# Patient Record
Sex: Male | Born: 1960 | Race: White | Hispanic: No | Smoking: Current every day smoker
Health system: Southern US, Community
[De-identification: ages and names within clinical notes are randomized; demographics above are authoritative.]

## PROBLEM LIST (undated history)

## (undated) DIAGNOSIS — M21372 Foot drop, left foot: Secondary | ICD-10-CM

## (undated) DIAGNOSIS — I10 Essential (primary) hypertension: Secondary | ICD-10-CM

## (undated) DIAGNOSIS — Z9289 Personal history of other medical treatment: Secondary | ICD-10-CM

## (undated) DIAGNOSIS — J449 Chronic obstructive pulmonary disease, unspecified: Secondary | ICD-10-CM

## (undated) DIAGNOSIS — F419 Anxiety disorder, unspecified: Secondary | ICD-10-CM

## (undated) DIAGNOSIS — D696 Thrombocytopenia, unspecified: Secondary | ICD-10-CM

## (undated) DIAGNOSIS — M4800 Spinal stenosis, site unspecified: Secondary | ICD-10-CM

## (undated) DIAGNOSIS — M1990 Unspecified osteoarthritis, unspecified site: Secondary | ICD-10-CM

## (undated) DIAGNOSIS — E669 Obesity, unspecified: Secondary | ICD-10-CM

## (undated) DIAGNOSIS — Z9889 Other specified postprocedural states: Secondary | ICD-10-CM

## (undated) HISTORY — PX: BACK SURGERY: SHX140

## (undated) HISTORY — PX: CERVICAL SPINE SURGERY: SHX589

## (undated) HISTORY — DX: Obesity, unspecified: E66.9

## (undated) HISTORY — DX: Other specified postprocedural states: Z98.890

## (undated) HISTORY — DX: Spinal stenosis, site unspecified: M48.00

## (undated) HISTORY — DX: Personal history of other medical treatment: Z92.89

## (undated) HISTORY — DX: Essential (primary) hypertension: I10

## (undated) HISTORY — DX: Unspecified osteoarthritis, unspecified site: M19.90

## (undated) HISTORY — PX: OTHER SURGICAL HISTORY: SHX169

## (undated) HISTORY — PX: LUMBAR DISC SURGERY: SHX700

## (undated) HISTORY — PX: BREAST REDUCTION SURGERY: SHX8

---

## 2001-10-29 ENCOUNTER — Encounter: Admission: RE | Admit: 2001-10-29 | Discharge: 2001-12-19 | Payer: Self-pay | Admitting: Family Medicine

## 2002-06-27 ENCOUNTER — Encounter: Payer: Self-pay | Admitting: Family Medicine

## 2002-06-27 ENCOUNTER — Encounter: Admission: RE | Admit: 2002-06-27 | Discharge: 2002-06-27 | Payer: Self-pay | Admitting: Family Medicine

## 2002-08-28 ENCOUNTER — Encounter: Payer: Self-pay | Admitting: Chiropractic Medicine

## 2002-08-28 ENCOUNTER — Encounter: Admission: RE | Admit: 2002-08-28 | Discharge: 2002-08-28 | Payer: Self-pay | Admitting: Chiropractic Medicine

## 2004-01-04 ENCOUNTER — Encounter: Admission: RE | Admit: 2004-01-04 | Discharge: 2004-01-04 | Payer: Self-pay | Admitting: Psychiatry

## 2004-02-17 ENCOUNTER — Encounter: Admission: RE | Admit: 2004-02-17 | Discharge: 2004-02-17 | Payer: Self-pay | Admitting: Family Medicine

## 2004-04-14 ENCOUNTER — Ambulatory Visit: Payer: Self-pay | Admitting: Psychiatry

## 2004-05-11 ENCOUNTER — Ambulatory Visit: Payer: Self-pay | Admitting: Psychiatry

## 2004-06-02 ENCOUNTER — Ambulatory Visit: Payer: Self-pay | Admitting: Psychiatry

## 2005-05-05 ENCOUNTER — Ambulatory Visit (HOSPITAL_COMMUNITY): Admission: RE | Admit: 2005-05-05 | Discharge: 2005-05-05 | Payer: Self-pay | Admitting: Family Medicine

## 2005-10-06 ENCOUNTER — Ambulatory Visit (HOSPITAL_COMMUNITY): Admission: RE | Admit: 2005-10-06 | Discharge: 2005-10-06 | Payer: Self-pay | Admitting: Family Medicine

## 2007-09-11 ENCOUNTER — Ambulatory Visit (HOSPITAL_COMMUNITY): Admission: RE | Admit: 2007-09-11 | Discharge: 2007-09-11 | Payer: Self-pay | Admitting: Family Medicine

## 2007-09-26 ENCOUNTER — Ambulatory Visit (HOSPITAL_COMMUNITY): Admission: RE | Admit: 2007-09-26 | Discharge: 2007-09-26 | Payer: Self-pay | Admitting: Family Medicine

## 2009-07-24 HISTORY — PX: CARDIAC CATHETERIZATION: SHX172

## 2010-02-15 ENCOUNTER — Ambulatory Visit (HOSPITAL_COMMUNITY): Admission: RE | Admit: 2010-02-15 | Discharge: 2010-02-15 | Payer: Self-pay | Admitting: Family Medicine

## 2010-03-11 ENCOUNTER — Ambulatory Visit (HOSPITAL_COMMUNITY): Admission: RE | Admit: 2010-03-11 | Discharge: 2010-03-11 | Payer: Self-pay | Admitting: Cardiovascular Disease

## 2010-03-25 ENCOUNTER — Ambulatory Visit (HOSPITAL_COMMUNITY): Admission: RE | Admit: 2010-03-25 | Discharge: 2010-03-25 | Payer: Self-pay | Admitting: Cardiology

## 2010-07-24 DIAGNOSIS — Z9289 Personal history of other medical treatment: Secondary | ICD-10-CM

## 2010-07-24 HISTORY — DX: Personal history of other medical treatment: Z92.89

## 2011-07-25 DIAGNOSIS — Z9289 Personal history of other medical treatment: Secondary | ICD-10-CM

## 2011-07-25 HISTORY — DX: Personal history of other medical treatment: Z92.89

## 2012-06-23 HISTORY — PX: LAPAROSCOPIC GASTRIC SLEEVE RESECTION: SHX5895

## 2012-09-20 ENCOUNTER — Telehealth: Payer: Self-pay

## 2012-09-20 NOTE — Telephone Encounter (Signed)
OV on 10/10/2012 at 11:00 AM with Lorenza Burton, NP due to meds, prior to scheduling colonoscopy.

## 2012-10-10 ENCOUNTER — Encounter (HOSPITAL_COMMUNITY): Payer: Self-pay | Admitting: Pharmacy Technician

## 2012-10-10 ENCOUNTER — Ambulatory Visit (INDEPENDENT_AMBULATORY_CARE_PROVIDER_SITE_OTHER): Payer: Self-pay | Admitting: Urgent Care

## 2012-10-10 ENCOUNTER — Encounter: Payer: Self-pay | Admitting: Urgent Care

## 2012-10-10 VITALS — BP 136/90 | HR 71 | Temp 98.5°F | Ht 72.0 in | Wt 309.8 lb

## 2012-10-10 DIAGNOSIS — K649 Unspecified hemorrhoids: Secondary | ICD-10-CM | POA: Insufficient documentation

## 2012-10-10 DIAGNOSIS — Z1211 Encounter for screening for malignant neoplasm of colon: Secondary | ICD-10-CM | POA: Insufficient documentation

## 2012-10-10 MED ORDER — PEG 3350-KCL-NA BICARB-NACL 420 G PO SOLR: 4000.0000 mL | ORAL | Status: DC

## 2012-10-10 MED ORDER — HYDROCORTISONE ACETATE 25 MG RE SUPP: 25.0000 mg | Freq: Two times a day (BID) | RECTAL | Status: DC

## 2012-10-10 NOTE — Assessment & Plan Note (Signed)
RORAN WEGNER is a pleasant 52 y.o. male due for screening colonoscopy with Dr Jena Gauss. I have discussed risks & benefits which include, but are not limited to, bleeding, infection, perforation & drug reaction.  The patient agrees with this plan & written consent will be obtained.   Phenergan 25mg  IV 30 minutes prior to procedure to augment sedation given hx of chronic narcotics, anxiolytics & alcohol

## 2012-10-10 NOTE — Patient Instructions (Addendum)
Colonoscopy with Dr Lavada Mesi [facility] supp twice daily for possible hemorrhoids.  Will revaluate further with colonoscopy. Hemorrhoids Hemorrhoids are enlarged (dilated) veins around the rectum. There are 2 types of hemorrhoids, and the type of hemorrhoid is determined by its location. Internal hemorrhoids occur in the veins just inside the rectum.They are usually not painful, but they may bleed.However, they may poke through to the outside and become irritated and painful. External hemorrhoids involve the veins outside the anus and can be felt as a painful swelling or hard lump near the anus.They are often itchy and may crack and bleed. Sometimes clots will form in the veins. This makes them swollen and painful. These are called thrombosed hemorrhoids. CAUSES Causes of hemorrhoids include:  Pregnancy. This increases the pressure in the hemorrhoidal veins.  Constipation.  Straining to have a bowel movement.  Obesity.  Heavy lifting or other activity that caused you to strain. TREATMENT Most of the time hemorrhoids improve in 1 to 2 weeks. However, if symptoms do not seem to be getting better or if you have a lot of rectal bleeding, your caregiver may perform a procedure to help make the hemorrhoids get smaller or remove them completely.Possible treatments include:  Rubber band ligation. A rubber band is placed at the base of the hemorrhoid to cut off the circulation.  Sclerotherapy. A chemical is injected to shrink the hemorrhoid.  Infrared light therapy. Tools are used to burn the hemorrhoid.  Hemorrhoidectomy. This is surgical removal of the hemorrhoid. HOME CARE INSTRUCTIONS   Increase fiber in your diet. Ask your caregiver about using fiber supplements.  Drink enough water and fluids to keep your urine clear or pale yellow.  Exercise regularly.  Go to the bathroom when you have the urge to have a bowel movement. Do not wait.  Avoid straining to have bowel movements.  Keep  the anal area dry and clean.  Only take over-the-counter or prescription medicines for pain, discomfort, or fever as directed by your caregiver. If your hemorrhoids are thrombosed:  Take warm sitz baths for 20 to 30 minutes, 3 to 4 times per day.  If the hemorrhoids are very tender and swollen, place ice packs on the area as tolerated. Using ice packs between sitz baths may be helpful. Fill a plastic bag with ice. Place a towel between the bag of ice and your skin.  Medicated creams and suppositories may be used or applied as directed.  Do not use a donut-shaped pillow or sit on the toilet for long periods. This increases blood pooling and pain. SEEK MEDICAL CARE IF:   You have increasing pain and swelling that is not controlled with your medicine.  You have uncontrolled bleeding.  You have difficulty or you are unable to have a bowel movement.  You have pain or inflammation outside the area of the hemorrhoids.  You have chills or an oral temperature above 102 F (38.9 C). MAKE SURE YOU:   Understand these instructions.  Will watch your condition.  Will get help right away if you are not doing well or get worse. Document Released: 07/07/2000 Document Revised: 10/02/2011 Document Reviewed: 06/20/2010 [facility] Patient Information 2013 [place], [place].

## 2012-10-10 NOTE — Assessment & Plan Note (Signed)
Anusol HC supp 1 PR BID Further evaluation with colonoscopy.

## 2012-10-10 NOTE — Progress Notes (Signed)
Referring Provider: McGough Primary Care Physician:  MCGOUGH,WILLIAM M, MD Primary Gastroenterologist:  Dr. Rourk  Chief Complaint  Patient presents with  . Colonoscopy    HPI:  Jerome [name] is a 52 y.o. male here as a referral from Dr. McGough to schedule screening colonoscopy.  He has been having problems with hemorrhoids protruding out.   Also, problems with hygeine.  He is on a chronic narcotic, anxiolytic & antidepressant.   Denies constipation, diarrhea, rectal bleeding, melena.  Recent gastric sleeve at [facility], losing weight.   Denies heartburn, indigestion, nausea, vomiting, dysphagia, odynophagia or anorexia. Past Medical History  Diagnosis Date  . DJD (degenerative joint disease)   . Spinal stenosis   . Hypertension   . Obesity     Past Surgical History  Procedure Laterality Date  . Laparoscopic gastric sleeve resection      Dr [place] ([facility])  . Breast reduction surgery    . Back surgery      x 4    Current Outpatient Prescriptions  Medication Sig Dispense Refill  . atorvastatin (LIPITOR) 20 MG tablet Take 20 mg by mouth daily.       . clonazePAM (KLONOPIN) 1 MG tablet Take 1 mg by mouth at bedtime as needed for anxiety (Sleep).       . Flaxseed, Linseed, (FLAX SEED OIL PO) Take 1 tablet by mouth daily.       . gabapentin (NEURONTIN) 100 MG capsule Take 200 mg by mouth at bedtime.       . HYDROcodone-acetaminophen (NORCO/VICODIN) 5-325 MG per tablet Take 1 tablet by mouth every 6 (six) hours as needed for pain.       . losartan (COZAAR) 50 MG tablet Take 50 mg by mouth daily.       . Multiple Vitamin (MULTIVITAMIN) capsule Take 2 capsules by mouth daily.      . omeprazole (PRILOSEC) 20 MG capsule Take 20 mg by mouth daily.       . sertraline (ZOLOFT) 100 MG tablet Take 100 mg by mouth daily.       . vitamin B-12 (CYANOCOBALAMIN) 1000 MCG tablet Take 1,000 mcg by mouth daily.      . hydrocortisone (ANUSOL-HC) 25 MG suppository Place 1 suppository (25 mg total)  rectally every 12 (twelve) hours.  20 suppository  0  . polyethylene glycol-electrolytes (TRILYTE) 420 G solution Take 4,000 mLs by mouth as directed.  4000 mL  0   No current facility-administered medications for this visit.    Allergies as of 10/10/2012  . (No Known Allergies)    Family History  Problem Relation Age of Onset  . Colon cancer Paternal Grandmother   . Brain cancer Brother 33    History   Social History  . Marital Status: Divorced    Spouse Name: N/A    Number of Children: 2  . Years of Education: N/A   Occupational History  . 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 Smoking status: Former Smoker -- 0.50 packs/day    Types: Cigarettes    Quit date: 06/12/2012  . Smokeless tobacco: Not on file  . Alcohol Use: No  . Drug Use: No  . Sexually Active: Not on file   Other Topics Concern  . Not on file   Social History Narrative   Moving to [place] to care for brother w/ brain tumor   Lives w/ 2 daughters & 4 grandsons    Review of   Systems: Gen: Denies any fever, chills, sweats, anorexia, fatigue, weakness, malaise CV: Denies chest pain, angina, palpitations, syncope, orthopnea, PND, peripheral edema, and claudication. Resp: Denies dyspnea at rest, dyspnea with exercise, cough, sputum, wheezing, coughing up blood, and pleurisy. GI: Denies vomiting blood, jaundice, and fecal incontinence.    GU : Denies urinary burning, blood in urine, urinary frequency, urinary hesitancy, nocturnal urination, and urinary incontinence. MS: Denies joint pain, limitation of movement, and swelling, stiffness, low back pain, extremity pain. Denies muscle weakness, cramps, atrophy.  Derm: Denies rash, itching, dry skin, hives, moles, warts, or unhealing ulcers.  Psych: Denies depression, anxiety, memory loss, suicidal ideation, hallucinations, paranoia, and confusion. Heme: Denies bruising, bleeding, and enlarged lymph nodes. Neuro:  Denies any  headaches, dizziness, paresthesias. Endo:  Denies any problems with DM, thyroid, adrenal function.  Physical Exam: BP 136/90  Pulse 71  Temp(Src) 98.5 F (36.9 C) (Oral)  Ht 6' (1.829 m)  Wt 309 lb 12.8 oz (140.524 kg)  BMI 42.01 kg/m2 No LMP for male patient. General:   Alert,  Well-developed, obese, pleasant and cooperative in NAD Head:  Normocephalic and atraumatic. Eyes:  Sclera clear, no icterus.   Conjunctiva pink. Ears:  Normal auditory acuity. Nose:  No deformity, discharge, or lesions. Mouth:  No deformity or lesions,oropharynx pink & moist. Neck:  Supple; no masses or thyromegaly. Lungs:  Clear throughout to auscultation.   No wheezes, crackles, or rhonchi. No acute distress. Heart:  Regular rate and rhythm; no murmurs, clicks, rubs,  or gallops. Abdomen:  Protuberant.  Normal bowel sounds.  No bruits.  Soft, non-tender and non-distended without masses, hepatosplenomegaly or hernias noted.  No guarding or rebound tenderness.  Exam limited given body habitus. Rectal:  Deferred. Msk:  Symmetrical without gross deformities. Normal posture. Pulses:  Normal pulses noted. Extremities:  No clubbing or edema. Neurologic:  Alert and oriented x4;  grossly normal neurologically. Skin:  Intact without significant lesions or rashes. Lymph Nodes:  No significant cervical adenopathy. Psych:  Alert and cooperative. Normal mood and affect.   

## 2012-10-11 NOTE — Progress Notes (Signed)
Faxed to PCP

## 2012-10-14 ENCOUNTER — Encounter (HOSPITAL_COMMUNITY): Payer: Self-pay | Admitting: *Deleted

## 2012-10-14 ENCOUNTER — Encounter (HOSPITAL_COMMUNITY)
Admission: RE | Disposition: A | Discharge status: Home / Self Care | Payer: Self-pay | Source: Ambulatory Visit | Attending: Internal Medicine

## 2012-10-14 ENCOUNTER — Ambulatory Visit (HOSPITAL_COMMUNITY)
Admission: RE | Admit: 2012-10-14 | Discharge: 2012-10-14 | Disposition: A | Discharge status: Home / Self Care | Payer: PRIVATE HEALTH INSURANCE | Source: Ambulatory Visit | Attending: Internal Medicine | Admitting: Internal Medicine

## 2012-10-14 DIAGNOSIS — M4800 Spinal stenosis, site unspecified: Secondary | ICD-10-CM | POA: Insufficient documentation

## 2012-10-14 DIAGNOSIS — Z800 Family history of malignant neoplasm of digestive organs: Secondary | ICD-10-CM | POA: Insufficient documentation

## 2012-10-14 DIAGNOSIS — Z6841 Body Mass Index (BMI) 40.0 and over, adult: Secondary | ICD-10-CM | POA: Insufficient documentation

## 2012-10-14 DIAGNOSIS — Z1211 Encounter for screening for malignant neoplasm of colon: Secondary | ICD-10-CM

## 2012-10-14 DIAGNOSIS — Z79899 Other long term (current) drug therapy: Secondary | ICD-10-CM | POA: Insufficient documentation

## 2012-10-14 DIAGNOSIS — M1990 Unspecified osteoarthritis, unspecified site: Secondary | ICD-10-CM | POA: Insufficient documentation

## 2012-10-14 DIAGNOSIS — Z87891 Personal history of nicotine dependence: Secondary | ICD-10-CM | POA: Insufficient documentation

## 2012-10-14 DIAGNOSIS — I10 Essential (primary) hypertension: Secondary | ICD-10-CM | POA: Insufficient documentation

## 2012-10-14 DIAGNOSIS — Z9884 Bariatric surgery status: Secondary | ICD-10-CM | POA: Insufficient documentation

## 2012-10-14 HISTORY — PX: COLONOSCOPY: SHX5424

## 2012-10-14 SURGERY — COLONOSCOPY
Anesthesia: Moderate Sedation

## 2012-10-14 MED ORDER — MIDAZOLAM HCL 5 MG/5ML IJ SOLN
INTRAMUSCULAR | Status: AC
  Filled 2012-10-14: qty 10

## 2012-10-14 MED ORDER — PROMETHAZINE HCL 25 MG/ML IJ SOLN
INTRAMUSCULAR | Status: AC
  Filled 2012-10-14: qty 1

## 2012-10-14 MED ORDER — MIDAZOLAM HCL 5 MG/5ML IJ SOLN
INTRAMUSCULAR | Status: DC | PRN
  Administered 2012-10-14: 1 mg via INTRAVENOUS
  Administered 2012-10-14 (×4): 2 mg via INTRAVENOUS

## 2012-10-14 MED ORDER — MEPERIDINE HCL 100 MG/ML IJ SOLN
INTRAMUSCULAR | Status: DC | PRN
  Administered 2012-10-14: 25 mg via INTRAVENOUS
  Administered 2012-10-14 (×2): 50 mg via INTRAVENOUS
  Administered 2012-10-14: 25 mg via INTRAVENOUS

## 2012-10-14 MED ORDER — MEPERIDINE HCL 100 MG/ML IJ SOLN
INTRAMUSCULAR | Status: AC
  Filled 2012-10-14: qty 1

## 2012-10-14 MED ORDER — STERILE WATER FOR IRRIGATION IR SOLN
Status: DC | PRN
  Administered 2012-10-14: 13:00:00

## 2012-10-14 MED ORDER — ONDANSETRON HCL 4 MG/2ML IJ SOLN
INTRAMUSCULAR | Status: AC
  Filled 2012-10-14: qty 2

## 2012-10-14 MED ORDER — SODIUM CHLORIDE 0.9 % IJ SOLN
INTRAMUSCULAR | Status: AC
Start: 2012-10-14 — End: 2012-10-14
  Filled 2012-10-14: qty 10

## 2012-10-14 MED ORDER — PROMETHAZINE HCL 25 MG/ML IJ SOLN
25.0000 mg | Freq: Once | INTRAMUSCULAR | Status: AC
  Administered 2012-10-14: 25 mg via INTRAVENOUS

## 2012-10-14 MED ORDER — ONDANSETRON HCL 4 MG/2ML IJ SOLN
INTRAMUSCULAR | Status: DC | PRN
  Administered 2012-10-14: 4 mg via INTRAVENOUS

## 2012-10-14 MED ORDER — SODIUM CHLORIDE 0.9 % IV SOLN
INTRAVENOUS | Status: DC
  Administered 2012-10-14: 13:00:00 via INTRAVENOUS

## 2012-10-14 NOTE — Op Note (Signed)
[facility] [address] [place], [address]   COLONOSCOPY PROCEDURE REPORT  PATIENT: [name], Jerome  MR#:         [id number] BIRTHDATE: [birth year] , 51  yrs. old GENDER: Male ENDOSCOPIST: R.  Roetta Sessions, MD FACP FACG REFERRED BY:  Karleen Hampshire, M.D. PROCEDURE DATE:  10/14/2012 PROCEDURE:     Screening colonoscopy  INDICATIONS:  Average risk colorectal cancer screening -first examination  INFORMED CONSENT:  The risks, benefits, alternatives and imponderables including but not limited to bleeding, perforation as well as the possibility of a missed lesion have been reviewed.  The potential for biopsy, lesion removal, etc. have also been discussed.  Questions have been answered.  All parties agreeable. Please see the history and physical in the medical record for more information.  MEDICATIONS: Versed 9 mg IV and Demerol 150 mg IV in divided doses. Cetacaine spray.  DESCRIPTION OF PROCEDURE:  After a digital rectal exam was performed, the EC-[id number] ([id number])  colonoscope was advanced from the anus through the rectum and colon to the area of the cecum, ileocecal valve and appendiceal orifice.  The cecum was deeply intubated.  These structures were well-seen and photographed for the record.  From the level of the cecum and ileocecal valve, the scope was slowly and cautiously withdrawn.  The mucosal surfaces were carefully surveyed utilizing scope tip deflection to facilitate fold flattening as needed.  The scope was pulled down into the rectum where a thorough examination including retroflexion was performed.    FINDINGS:  Adequate preparation. Normal rectum Normal-appearing colonic mucosa.  THERAPEUTIC / DIAGNOSTIC MANEUVERS PERFORMED:  None  COMPLICATIONS: None  CECAL WITHDRAWAL TIME:  8 minutes  IMPRESSION:  Normal rectum and colon  RECOMMENDATIONS: Repeat colonoscopy in 10 years for  screening purposes.   _______________________________ eSigned:  R. Roetta Sessions, MD FACP [facility] 10/14/2012 2:17 PM   CC:

## 2012-10-14 NOTE — H&P (View-Only) (Signed)
Referring Provider: McGough Primary Care Physician:  Kirk Ruths, MD Primary Gastroenterologist:  Dr. Jena Gauss  Chief Complaint  Patient presents with  . Colonoscopy    HPI:  Jerome [name] is a 52 y.o. male here as a referral from Dr. Regino Schultze to schedule screening colonoscopy.  He has been having problems with hemorrhoids protruding out.   Also, problems with hygeine.  He is on a chronic narcotic, anxiolytic & antidepressant.   Denies constipation, diarrhea, rectal bleeding, melena.  Recent gastric sleeve at [facility], losing weight.   Denies heartburn, indigestion, nausea, vomiting, dysphagia, odynophagia or anorexia. Past Medical History  Diagnosis Date  . DJD (degenerative joint disease)   . Spinal stenosis   . Hypertension   . Obesity     Past Surgical History  Procedure Laterality Date  . Laparoscopic gastric sleeve resection      Dr Roe Rutherford [facility])  . Breast reduction surgery    . Back surgery      x 4    Current Outpatient Prescriptions  Medication Sig Dispense Refill  . atorvastatin (LIPITOR) 20 MG tablet Take 20 mg by mouth daily.       . clonazePAM (KLONOPIN) 1 MG tablet Take 1 mg by mouth at bedtime as needed for anxiety (Sleep).       . Flaxseed, Linseed, (FLAX SEED OIL PO) Take 1 tablet by mouth daily.       Marland Kitchen gabapentin (NEURONTIN) 100 MG capsule Take 200 mg by mouth at bedtime.       Marland Kitchen HYDROcodone-acetaminophen (NORCO/VICODIN) 5-325 MG per tablet Take 1 tablet by mouth every 6 (six) hours as needed for pain.       Marland Kitchen losartan (COZAAR) 50 MG tablet Take 50 mg by mouth daily.       . Multiple Vitamin (MULTIVITAMIN) capsule Take 2 capsules by mouth daily.      Marland Kitchen omeprazole (PRILOSEC) 20 MG capsule Take 20 mg by mouth daily.       . sertraline (ZOLOFT) 100 MG tablet Take 100 mg by mouth daily.       . vitamin B-12 (CYANOCOBALAMIN) 1000 MCG tablet Take 1,000 mcg by mouth daily.      . hydrocortisone (ANUSOL-HC) 25 MG suppository Place 1 suppository (25 mg total)  rectally every 12 (twelve) hours.  20 suppository  0  . polyethylene glycol-electrolytes (TRILYTE) 420 G solution Take 4,000 mLs by mouth as directed.  4000 mL  0   No current facility-administered medications for this visit.    Allergies as of 10/10/2012  . (No Known Allergies)    Family History  Problem Relation Age of Onset  . Colon cancer Paternal Grandmother   . Brain cancer Brother 22    History   Social History  . Marital Status: Divorced    Spouse Name: N/A    Number of Children: 2  . Years of Education: N/A   Occupational History  . 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 Smoking status: Former Smoker -- 0.50 packs/day    Types: Cigarettes    Quit date: 06/12/2012  . Smokeless tobacco: Not on file  . Alcohol Use: No  . Drug Use: No  . Sexually Active: Not on file   Other Topics Concern  . Not on file   Social History Narrative   Moving to [place] to care for brother w/ brain tumor   Lives w/ 2 daughters & 4 grandsons    Review of  Systems: Gen: Denies any fever, chills, sweats, anorexia, fatigue, weakness, malaise CV: Denies chest pain, angina, palpitations, syncope, orthopnea, PND, peripheral edema, and claudication. Resp: Denies dyspnea at rest, dyspnea with exercise, cough, sputum, wheezing, coughing up blood, and pleurisy. GI: Denies vomiting blood, jaundice, and fecal incontinence.    GU : Denies urinary burning, blood in urine, urinary frequency, urinary hesitancy, nocturnal urination, and urinary incontinence. MS: Denies joint pain, limitation of movement, and swelling, stiffness, low back pain, extremity pain. Denies muscle weakness, cramps, atrophy.  Derm: Denies rash, itching, dry skin, hives, moles, warts, or unhealing ulcers.  Psych: Denies depression, anxiety, memory loss, suicidal ideation, hallucinations, paranoia, and confusion. Heme: Denies bruising, bleeding, and enlarged lymph nodes. Neuro:  Denies any  headaches, dizziness, paresthesias. Endo:  Denies any problems with DM, thyroid, adrenal function.  Physical Exam: BP 136/90  Pulse 71  Temp(Src) 98.5 F (36.9 C) (Oral)  Ht 6' (1.829 m)  Wt 309 lb 12.8 oz (140.524 kg)  BMI 42.01 kg/m2 No LMP for male patient. General:   Alert,  Well-developed, obese, pleasant and cooperative in NAD Head:  Normocephalic and atraumatic. Eyes:  Sclera clear, no icterus.   Conjunctiva pink. Ears:  Normal auditory acuity. Nose:  No deformity, discharge, or lesions. Mouth:  No deformity or lesions,oropharynx pink & moist. Neck:  Supple; no masses or thyromegaly. Lungs:  Clear throughout to auscultation.   No wheezes, crackles, or rhonchi. No acute distress. Heart:  Regular rate and rhythm; no murmurs, clicks, rubs,  or gallops. Abdomen:  Protuberant.  Normal bowel sounds.  No bruits.  Soft, non-tender and non-distended without masses, hepatosplenomegaly or hernias noted.  No guarding or rebound tenderness.  Exam limited given body habitus. Rectal:  Deferred. Msk:  Symmetrical without gross deformities. Normal posture. Pulses:  Normal pulses noted. Extremities:  No clubbing or edema. Neurologic:  Alert and oriented x4;  grossly normal neurologically. Skin:  Intact without significant lesions or rashes. Lymph Nodes:  No significant cervical adenopathy. Psych:  Alert and cooperative. Normal mood and affect.

## 2012-10-14 NOTE — Interval H&P Note (Signed)
History and Physical Interval Note:  10/14/2012 1:29 PM  Jerome [name]  has presented today for surgery, with the diagnosis of SCREENING TCS  The various methods of treatment have been discussed with the patient and family. After consideration of risks, benefits and other options for treatment, the patient has consented to  Procedure(s) with comments: COLONOSCOPY (N/A) - 1:45 as a surgical intervention .  The patient's history has been reviewed, patient examined, no change in status, stable for surgery.  I have reviewed the patient's chart and labs.  Questions were answered to the patient's satisfaction.    Colonoscopy per plan.The risks, benefits, limitations, alternatives and imponderables have been reviewed with the patient. Questions have been answered. All parties are agreeable.  Eula Listen

## 2012-10-17 ENCOUNTER — Encounter (HOSPITAL_COMMUNITY): Payer: Self-pay | Admitting: Internal Medicine

## 2012-12-09 ENCOUNTER — Other Ambulatory Visit: Payer: Self-pay | Admitting: Pharmacist

## 2012-12-09 MED ORDER — ATORVASTATIN CALCIUM 20 MG PO TABS: 20.0000 mg | ORAL_TABLET | Freq: Every day | ORAL | Status: DC

## 2013-01-06 DIAGNOSIS — E785 Hyperlipidemia, unspecified: Secondary | ICD-10-CM | POA: Insufficient documentation

## 2013-01-06 DIAGNOSIS — K219 Gastro-esophageal reflux disease without esophagitis: Secondary | ICD-10-CM | POA: Insufficient documentation

## 2013-04-22 ENCOUNTER — Other Ambulatory Visit (HOSPITAL_COMMUNITY): Payer: Self-pay | Admitting: Family Medicine

## 2013-04-22 ENCOUNTER — Ambulatory Visit (HOSPITAL_COMMUNITY)
Admission: RE | Admit: 2013-04-22 | Discharge: 2013-04-22 | Disposition: A | Discharge status: Home / Self Care | Payer: BC Managed Care – PPO | Source: Ambulatory Visit | Attending: Family Medicine | Admitting: Family Medicine

## 2013-04-22 DIAGNOSIS — E785 Hyperlipidemia, unspecified: Secondary | ICD-10-CM

## 2013-04-22 DIAGNOSIS — I10 Essential (primary) hypertension: Secondary | ICD-10-CM | POA: Insufficient documentation

## 2013-04-22 DIAGNOSIS — Z0000 Encounter for general adult medical examination without abnormal findings: Secondary | ICD-10-CM

## 2013-07-27 ENCOUNTER — Emergency Department (HOSPITAL_COMMUNITY)
Admission: EM | Admit: 2013-07-27 | Discharge: 2013-07-27 | Disposition: A | Discharge status: Home / Self Care | Payer: BC Managed Care – PPO | Attending: Emergency Medicine | Admitting: Emergency Medicine

## 2013-07-27 ENCOUNTER — Encounter (HOSPITAL_COMMUNITY): Payer: Self-pay | Admitting: Emergency Medicine

## 2013-07-27 DIAGNOSIS — M1990 Unspecified osteoarthritis, unspecified site: Secondary | ICD-10-CM | POA: Insufficient documentation

## 2013-07-27 DIAGNOSIS — Z206 Contact with and (suspected) exposure to human immunodeficiency virus [HIV]: Secondary | ICD-10-CM

## 2013-07-27 DIAGNOSIS — Z862 Personal history of diseases of the blood and blood-forming organs and certain disorders involving the immune mechanism: Secondary | ICD-10-CM | POA: Insufficient documentation

## 2013-07-27 DIAGNOSIS — I10 Essential (primary) hypertension: Secondary | ICD-10-CM | POA: Insufficient documentation

## 2013-07-27 DIAGNOSIS — Z87891 Personal history of nicotine dependence: Secondary | ICD-10-CM | POA: Insufficient documentation

## 2013-07-27 DIAGNOSIS — Z202 Contact with and (suspected) exposure to infections with a predominantly sexual mode of transmission: Secondary | ICD-10-CM | POA: Insufficient documentation

## 2013-07-27 DIAGNOSIS — E669 Obesity, unspecified: Secondary | ICD-10-CM | POA: Insufficient documentation

## 2013-07-27 HISTORY — DX: Thrombocytopenia, unspecified: D69.6

## 2013-07-27 LAB — COMPREHENSIVE METABOLIC PANEL
ALK PHOS: 87 U/L (ref 39–117)
ALT: 21 U/L (ref 0–53)
AST: 20 U/L (ref 0–37)
Albumin: 4.4 g/dL (ref 3.5–5.2)
BILIRUBIN TOTAL: 0.8 mg/dL (ref 0.3–1.2)
BUN: 15 mg/dL (ref 6–23)
CHLORIDE: 103 meq/L (ref 96–112)
CO2: 25 meq/L (ref 19–32)
CREATININE: 0.64 mg/dL (ref 0.50–1.35)
Calcium: 9.8 mg/dL (ref 8.4–10.5)
GFR calc Af Amer: >90 mL/min (ref >90)
Glucose, Bld: 94 mg/dL (ref 70–99)
Potassium: 4.7 mEq/L (ref 3.7–5.3)
Sodium: 140 mEq/L (ref 137–147)
Total Protein: 7.5 g/dL (ref 6.0–8.3)

## 2013-07-27 LAB — CBC
HEMATOCRIT: 49.6 % (ref 39.0–52.0)
Hemoglobin: 17.8 g/dL — ABNORMAL HIGH (ref 13.0–17.0)
MCH: 31.1 pg (ref 26.0–34.0)
MCHC: 35.9 g/dL (ref 30.0–36.0)
MCV: 86.7 fL (ref 78.0–100.0)
PLATELETS: 105 10*3/uL — AB (ref 150–400)
RBC: 5.72 MIL/uL (ref 4.22–5.81)
RDW: 14 % (ref 11.5–15.5)
WBC: 6.9 10*3/uL (ref 4.0–10.5)

## 2013-07-27 LAB — HEPATITIS PANEL, ACUTE
HCV Ab: NEGATIVE
HEP B S AG: NEGATIVE
Hep A IgM: NONREACTIVE
Hep B C IgM: NONREACTIVE

## 2013-07-27 LAB — RAPID HIV SCREEN (WH-MAU): Rapid HIV Screen: NONREACTIVE

## 2013-07-27 MED ORDER — EMTRICITABINE-TENOFOVIR DF 200-300 MG PO TABS: 1.0000 | ORAL_TABLET | Freq: Every day | ORAL | Status: DC

## 2013-07-27 MED ORDER — EMTRICITABINE-TENOFOVIR DF 200-300 MG PO TABS
3.0000 | ORAL_TABLET | Freq: Every day | ORAL | Status: DC
  Administered 2013-07-27: 3 via ORAL
  Filled 2013-07-27 (×2): qty 3

## 2013-07-27 MED ORDER — LOPINAVIR-RITONAVIR 200-50 MG PO TABS: 2.0000 | ORAL_TABLET | Freq: Two times a day (BID) | ORAL | Status: DC

## 2013-07-27 MED ORDER — RALTEGRAVIR POTASSIUM 400 MG PO TABS
400.0000 mg | ORAL_TABLET | Freq: Two times a day (BID) | ORAL | Status: DC
  Administered 2013-07-27: 400 mg via ORAL
  Filled 2013-07-27 (×3): qty 1

## 2013-07-27 MED ORDER — RALTEGRAVIR POTASSIUM 400 MG PO TABS
400.0000 mg | ORAL_TABLET | Freq: Two times a day (BID) | ORAL | Status: DC
Start: 2013-07-27 — End: 2013-07-29

## 2013-07-27 NOTE — Discharge Instructions (Signed)
HIV Antibody Test HIV is a virus which destroys our body's ability to fight illness. It does this by causing defects in our immune system. This is the system that protects our body against infections. This virus is the cause of an illness called AIDS. HIV antibodies are made by the infected person's body when a person becomes infected with HIV. WHAT IS THE HIV ANTIBODY TEST? This is a test for HIV antibodies that are found in the blood of an infected person. This test is not a test for AIDS. It only means that you have been infected with HIV and may eventually develop AIDS. WHO IS AT RISK OF BEING INFECTED WITH HIV?  People who have unsafe sex (unsafe sex means having sex without a condom (or other protective barrier) with a person who has the virus.  People who share IV needles or syringes with a person who has the virus.  Anyone who got blood, blood products, or organ transplants before 1985.  Babies born to mothers who have HIV.  Coming in contact with blood or other body fluids of someone infected with HIV. WHAT DOES A NEGATIVE HIV TEST RESULT MEAN? HIV antibodies were not found in your blood. Most of the time it takes our bodies between 6 weeks and 6 months to develop antibodies to HIV. It may take up to one year to develop. During this time, infected people can have a negative result even if they have the virus and will therefore not know if they are putting other people at risk. They should take all necessary precautions to protect others from becoming infected. WHAT DOES A POSITIVE HIV TEST RESULT MEAN?  A positive HIV test means a person has been infected with the HIV virus. This does NOT mean that a person has AIDS, but they may eventually develop it.  A person can give HIV infection to other people through unsafe sex. Sharing IV needles or syringes can also spread HIV.  A woman who has HIV can give the virus to her baby during pregnancy or at birth or possibly from breastfeeding.  Get counseling prior to considering pregnancy. One third to one half of women with HIV infection will pass this infection on to their baby.  Anyone with a positive test for HIV should not donate blood, plasma, blood products, organs or tissues. WHERE CAN I GO TO BE TESTED?  Most county health departments offer HIV Antibody counseling and testing.  Many doctors and other caregivers offer HIV Antibody counseling and testing. If you received a blood test from your caregiver, call for your results as instructed. Remember it is your responsibility to get the results of your test. Do not assume everything is fine if you do not hear from your caregiver. If you get a positive test result, talk to your caregiver to find out what steps to take to assure you receive the best of care. Numerous medications are now available which improve the course of this infection. Document Released: 07/07/2000 Document Revised: 10/02/2011 Document Reviewed: 07/10/2005 [facility] Patient Information 2014 [place].

## 2013-07-27 NOTE — ED Provider Notes (Signed)
CSN: [id number]     Arrival date & time 07/27/13  1016 History   First MD Initiated Contact with Patient 07/27/13 1114     Chief Complaint  Patient presents with  . 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   Pt reports partner has HIv with low viral load.   Pt reports he was anal receptor and condom came off.   Pt wants to start post exposure drugs.   Pt has a history of back problems.  htn and thrombocytopenia.  Pt denies any current complaints  The history is provided by the patient. No language interpreter was used.    Past Medical History  Diagnosis Date  . DJD (degenerative joint disease)   . Spinal stenosis   . Hypertension   . Obesity   . Thrombocytopenia    Past Surgical History  Procedure Laterality Date  . Laparoscopic gastric sleeve resection      Dr Mickie Bail [facility])  . Breast reduction surgery    . Back surgery      x 4  . Colonoscopy N/A 10/14/2012    Procedure: COLONOSCOPY;  Surgeon: Daneil Dolin, MD;  Location: [facility];  Service: Endoscopy;  Laterality: N/A;  1:45   Family History  Problem Relation Age of Onset  . Colon cancer Paternal Grandmother   . Brain cancer Brother 68   History  Substance Use Topics  . Smoking status: Former Smoker -- 0.50 packs/day    Types: Cigarettes    Quit date: 06/12/2012  . Smokeless tobacco: Not on file  . Alcohol Use: No    Review of Systems  All other systems reviewed and are negative.    Allergies  Codeine and Nsaids  Home Medications   Current Outpatient Rx  Name  Route  Sig  Dispense  Refill  . atorvastatin (LIPITOR) 20 MG tablet   Oral   Take 1 tablet (20 mg total) by mouth daily.   90 tablet   1   . clonazePAM (KLONOPIN) 1 MG tablet   Oral   Take 1 mg by mouth at bedtime as needed for anxiety (Sleep).          Marland Kitchen emtricitabine-tenofovir (TRUVADA) 200-300 MG per  tablet   Oral   Take 1 tablet by mouth daily.   30 tablet   1   . Flaxseed, Linseed, (FLAX SEED OIL PO)   Oral   Take 1 tablet by mouth daily.          Marland Kitchen gabapentin (NEURONTIN) 100 MG capsule   Oral   Take 200 mg by mouth at bedtime.          Marland Kitchen HYDROcodone-acetaminophen (NORCO/VICODIN) 5-325 MG per tablet   Oral   Take 1 tablet by mouth every 6 (six) hours as needed for pain.          . hydrocortisone (ANUSOL-HC) 25 MG suppository   Rectal   Place 1 suppository (25 mg total) rectally every 12 (twelve) hours.   20 suppository   0   . lopinavir-ritonavir (KALETRA) 200-50 MG per tablet   Oral   Take 2 tablets by mouth 2 (two) times daily.   120 tablet   1   . losartan (COZAAR) 50 MG tablet   Oral   Take 50 mg by mouth daily.          . Multiple Vitamin (MULTIVITAMIN) capsule   Oral  Take 2 capsules by mouth daily.         Marland Kitchen omeprazole (PRILOSEC) 20 MG capsule   Oral   Take 20 mg by mouth daily.          . polyethylene glycol-electrolytes (TRILYTE) 420 G solution   Oral   Take 4,000 mLs by mouth as directed.   4000 mL   0   . sertraline (ZOLOFT) 100 MG tablet   Oral   Take 100 mg by mouth daily.          . vitamin B-12 (CYANOCOBALAMIN) 1000 MCG tablet   Oral   Take 1,000 mcg by mouth daily.          BP 124/85  Pulse 86  Temp(Src) 98.4 F (36.9 C) (Oral)  Resp 16  Ht 6' (1.829 m)  Wt 260 lb 3.2 oz (118.026 kg)  BMI 35.28 kg/m2  SpO2 98% Physical Exam  Constitutional: He appears well-developed and well-nourished.  HENT:  Head: Normocephalic.  Right Ear: External ear normal.  Left Ear: External ear normal.  Nose: Nose normal.  Mouth/Throat: Oropharynx is clear and moist.  Eyes: Conjunctivae are normal. Pupils are equal, round, and reactive to light.  Neck: Normal range of motion. Neck supple.  Cardiovascular: Normal rate.   Pulmonary/Chest: Effort normal.  Abdominal: Soft.  Musculoskeletal: Normal range of motion.   Neurological: He is alert.  Skin: Skin is warm.    ED Course  Procedures (including critical care time) Labs Review Labs Reviewed  CBC - Abnormal; Notable for the following:    Hemoglobin 17.8 (*)    Platelets 105 (*)    All other components within normal limits  COMPREHENSIVE METABOLIC PANEL  RAPID HIV SCREEN ([facility])  HEPATITIS PANEL, ACUTE   Imaging Review No results found.  EKG Interpretation   None       MDM   1. Exposure to HIV      I spoke with Dr. Graylon Good infectious disease who advised truvada and isentress.  Labs obtained here.   Pt advised to go to Infectious disease clinic Monday to schedule appointment and to get coupon for copay on medications.    [place], PA-C 07/27/13 1323

## 2013-07-27 NOTE — ED Notes (Signed)
Pt was exposed to HIV on Friday night. Would like to possibly get tested and started on drugs.

## 2013-07-27 NOTE — ED Notes (Signed)
Pharmacy called for Antivirals.

## 2013-07-27 NOTE — ED Notes (Signed)
Waiting on meds from pharmacy for pt discharge.

## 2013-07-28 NOTE — ED Provider Notes (Signed)
Medical screening examination/treatment/procedure(s) were performed by non-physician practitioner and as supervising physician I was immediately available for consultation/collaboration.  Kalyna Paolella J. Prudie Guthridge, MD 07/28/13 1523 

## 2013-07-29 ENCOUNTER — Encounter: Payer: Self-pay | Admitting: Infectious Diseases

## 2013-07-29 ENCOUNTER — Ambulatory Visit (INDEPENDENT_AMBULATORY_CARE_PROVIDER_SITE_OTHER): Payer: BC Managed Care – PPO | Admitting: Infectious Diseases

## 2013-07-29 VITALS — BP 128/85 | HR 96 | Ht 72.5 in | Wt 260.0 lb

## 2013-07-29 DIAGNOSIS — Z113 Encounter for screening for infections with a predominantly sexual mode of transmission: Secondary | ICD-10-CM

## 2013-07-29 DIAGNOSIS — Z206 Contact with and (suspected) exposure to human immunodeficiency virus [HIV]: Secondary | ICD-10-CM | POA: Insufficient documentation

## 2013-07-29 DIAGNOSIS — Z20828 Contact with and (suspected) exposure to other viral communicable diseases: Secondary | ICD-10-CM

## 2013-07-29 MED ORDER — DOLUTEGRAVIR SODIUM 50 MG PO TABS: 50.0000 mg | ORAL_TABLET | Freq: Every day | ORAL | Status: DC

## 2013-07-29 NOTE — Progress Notes (Signed)
   Subjective:    Patient ID: Jerome [name], male    DOB: 1960/08/17, 53 y.o.   MRN: [id number]  HPI 53 yo M with hx of idiopathic thrombocytopenia, who had MSM HIV+ exposure on 07-25-13. Was using condoms but came off. Was seen in [facility] ED 07-27-13 and had rapid screening (-), got started on ISN/TRV.   Exposure told pt he had undetectable VL, pt is not clear which medicines partner is taking.  Today has difficulty with partners lack of disclosure, trust.   No hx of STI's.  Review of Systems  Constitutional: Negative for fever, chills, appetite change and unexpected weight change.  HENT: Negative for nosebleeds.   Respiratory: Negative for shortness of breath.   Cardiovascular: Negative for chest pain.  Gastrointestinal: Negative for diarrhea and constipation.  Genitourinary: Negative for difficulty urinating.  Musculoskeletal: Positive for back pain.  Neurological: Negative for headaches.  Hematological: Negative for adenopathy.  Psychiatric/Behavioral: The patient is nervous/anxious.        Objective:   Physical Exam  Constitutional: He appears well-developed and well-nourished.  HENT:  Mouth/Throat: No oropharyngeal exudate.  Eyes: EOM are normal. Pupils are equal, round, and reactive to light.  Neck: Neck supple.  Cardiovascular: Normal rate, regular rhythm and normal heart sounds.   Pulmonary/Chest: Effort normal and breath sounds normal.  Abdominal: Soft. Bowel sounds are normal. He exhibits no distension. There is no tenderness.  Musculoskeletal: He exhibits no edema.  Lymphadenopathy:    He has no cervical adenopathy.  Skin: No rash noted.          Assessment & Plan:

## 2013-07-29 NOTE — Addendum Note (Signed)
Addended by: Katurah Karapetian C on: 07/29/2013 10:04 AM   Modules accepted: Orders

## 2013-07-29 NOTE — Assessment & Plan Note (Addendum)
Pt with HIV exposure, he will call back when he has more info about his exposure (medications, VL). Will continue his trv, change his isn to dtgv. Check gc/chlamydia, RPR.  Suggested he start hep a and b vacines. Will see him back in 1 month to repeat his serologies. Will re-address his vax at his f/u.

## 2013-07-30 LAB — RPR

## 2013-08-08 ENCOUNTER — Other Ambulatory Visit: Payer: Self-pay | Admitting: Infectious Diseases

## 2013-08-08 DIAGNOSIS — B20 Human immunodeficiency virus [HIV] disease: Secondary | ICD-10-CM

## 2013-08-14 ENCOUNTER — Other Ambulatory Visit: Payer: Self-pay | Admitting: Infectious Diseases

## 2013-08-25 ENCOUNTER — Ambulatory Visit (INDEPENDENT_AMBULATORY_CARE_PROVIDER_SITE_OTHER): Payer: BC Managed Care – PPO | Admitting: Infectious Diseases

## 2013-08-25 ENCOUNTER — Encounter: Payer: Self-pay | Admitting: Infectious Diseases

## 2013-08-25 VITALS — BP 119/77 | HR 90 | Temp 98.5°F | Ht 73.0 in | Wt 265.0 lb

## 2013-08-25 DIAGNOSIS — Z206 Contact with and (suspected) exposure to human immunodeficiency virus [HIV]: Secondary | ICD-10-CM

## 2013-08-25 DIAGNOSIS — Z9989 Dependence on other enabling machines and devices: Secondary | ICD-10-CM

## 2013-08-25 DIAGNOSIS — G4733 Obstructive sleep apnea (adult) (pediatric): Secondary | ICD-10-CM | POA: Insufficient documentation

## 2013-08-25 DIAGNOSIS — Z20828 Contact with and (suspected) exposure to other viral communicable diseases: Secondary | ICD-10-CM

## 2013-08-25 LAB — HIV ANTIBODY (ROUTINE TESTING W REFLEX): HIV: NONREACTIVE

## 2013-08-25 NOTE — Assessment & Plan Note (Addendum)
Will check his HIV RNA and ab tests. He is doing well, will complete his meds in 2 days. Will return to clinic prn. Needs repeat lab in 6 months.

## 2013-08-25 NOTE — Progress Notes (Signed)
   Subjective:    Patient ID: Jerome [name], male    DOB: [birth year], 53 y.o.   MRN: [id number]  HPI 53 yo M with hx of idiopathic thrombocytopenia, who had MSM HIV+ (who would not tell him his ART names) exposure on 07-25-13. Was using condoms but came off. Was seen in [facility] ED 07-27-13 and had rapid screening (-), got started on ISN/TRV. Was seen in ID 07-29-13 and changed to DTGV/TRV. Has 2 days of pills remaining.  Has been taking well but noted low back pain, no nausea. Has had more allergies, clear sinus d/c. Has had to take zyrtec.  Has gained 10# since last visit.    Review of Systems  Constitutional: Negative for appetite change and unexpected weight change.  HENT: Positive for postnasal drip and rhinorrhea.   Gastrointestinal: Negative for nausea, diarrhea and constipation.  Genitourinary: Negative for difficulty urinating.       Objective:   Physical Exam  Constitutional: He appears well-developed and well-nourished.  HENT:  Mouth/Throat: No oropharyngeal exudate.  Eyes: EOM are normal. Pupils are equal, round, and reactive to light.  Neck: Neck supple.  Cardiovascular: Normal rate, regular rhythm and normal heart sounds.   Pulmonary/Chest: Effort normal and breath sounds normal.  Abdominal: Soft. Bowel sounds are normal. He exhibits no distension. There is no tenderness.  Lymphadenopathy:    He has no cervical adenopathy.          Assessment & Plan:

## 2013-08-26 ENCOUNTER — Ambulatory Visit: Payer: BC Managed Care – PPO | Admitting: Internal Medicine

## 2013-08-27 LAB — HIV-1 RNA QUANT-NO REFLEX-BLD

## 2013-09-02 ENCOUNTER — Telehealth: Payer: Self-pay | Admitting: Infectious Diseases

## 2013-09-02 NOTE — Telephone Encounter (Signed)
Called pt to let him know that his repeat labs are (-).

## 2013-09-26 ENCOUNTER — Telehealth: Payer: Self-pay | Admitting: Internal Medicine

## 2013-09-26 NOTE — Telephone Encounter (Signed)
Pt called today asking if we could call him if we have any cancellations before 3/18 and would like to speak with nurse about his conditions he is experiencing. He didn't want to give me the "graphic details", but he does have hemorrhoids and his constipated. Please call [phone] and I will try to find a cancellation to move his OV up

## 2013-09-29 ENCOUNTER — Other Ambulatory Visit: Payer: Self-pay | Admitting: Urgent Care

## 2013-09-29 ENCOUNTER — Telehealth: Payer: Self-pay | Admitting: Internal Medicine

## 2013-09-29 MED ORDER — HYDROCORTISONE ACETATE 25 MG RE SUPP: 25.0000 mg | Freq: Two times a day (BID) | RECTAL | Status: DC

## 2013-09-29 NOTE — Telephone Encounter (Signed)
Completed.

## 2013-09-29 NOTE — Telephone Encounter (Addendum)
PT CALLED. PHARMACY DID NOT HAVE RX FOR ANUSOL. CALLED [facility]. PRESSED OPTION 8 and 1. Phone rang & kept resorting to initial menu options. Pressed 3 to speak to someone. PT WOULD LIKE TO SEE SOMEONE THIS WEEK DUE TO RECTAL BLEEDING & pain. Spoke to pharmacy. Rx not refilled earlier. Confirmed Anusol received.

## 2013-09-29 NOTE — Addendum Note (Signed)
Addended by: Orvil Feil on: 09/29/2013 03:52 PM   Modules accepted: Orders

## 2013-09-29 NOTE — Telephone Encounter (Signed)
Pt aware that medication is at drug store.

## 2013-09-29 NOTE — Telephone Encounter (Signed)
Pt called this morning to see if he could get some rectal medication(supp) until his appointment on 10/08/13. He said just enough until his appointment.  [facility] in [place]. His call back number is [phone].

## 2013-09-29 NOTE — Addendum Note (Signed)
Addended by: Danie Binder on: 09/29/2013 07:12 PM   Modules accepted: Orders

## 2013-09-29 NOTE — Telephone Encounter (Signed)
Pt said that he was been waiting for a return call regarding a prescription to hold him until his OV. Patient states that he is weak and feels like he could pass out and that he has called his pharmacy ([place]) and they were going to send [name] a request for the prescription. I told him that the provider was aware, but hasn't addressed it yet, but should be taken care of by the end of the day per [facility]. Please call patient to let him know that it has been called in [phone]

## 2013-09-29 NOTE — Telephone Encounter (Signed)
Pt aware.

## 2013-10-06 ENCOUNTER — Telehealth: Payer: Self-pay | Admitting: *Deleted

## 2013-10-06 MED ORDER — LIDOCAINE-HYDROCORTISONE ACE 2-2 % RE KIT: 1.0000 "application " | PACK | Freq: Two times a day (BID) | RECTAL | Status: DC

## 2013-10-06 NOTE — Telephone Encounter (Signed)
I called pt to reschedule his appointment with LSL 10/08/13 8:30 AM I explained to him that LSL had a death in the family and will not be in the office, the next open spot we have is 11/03/13 pt stated he did not want to wait that long that the 18th was a stretch for him, pt states he spoke with Dr. Gala Romney last week and said Dr. Gala Romney talked like pt needed to be seen last week, Pt said he has not had any luck with the office calling him back, pt states on his RX the office didn't send it in Dr. Gala Romney did, pt said he is using the bathroom 6 times a day and he feels like he needs to pass out as he is using the bathroom pt said he only needs to be using the bathroom one time a day not 6, pt said he is in a lot of pain and it is obvious that his hemorrhoids are rupturing, pt said he can go to [facility] and pay the 100$ if he has to just to be seen, pt said he likes Dr. Gala Romney but he can go to [facility] to be seen, I made pt aware that I will speak with my office manager Rosendo Gros about getting him a sooner appointment and we will call him about it pt said his cell is the best number to reach him at, pt stated he again the office has not been good about calling him back. I made pt aware that we will call him back about his appointment.

## 2013-10-06 NOTE — Telephone Encounter (Signed)
As discussed with Rosendo Gros, I will be willing to add an additional office visit spot on Friday to accommodate the patient.  We can try hydrocortisone/lidocaine given patient has been complaining of rectal pain.  In the interim, if his symptoms worsen he should go to the emergency department.

## 2013-10-06 NOTE — Telephone Encounter (Signed)
Pt is aware of Rx 

## 2013-10-06 NOTE — Telephone Encounter (Signed)
Pt is aware of appt

## 2013-10-06 NOTE — Telephone Encounter (Signed)
Routing to [name]

## 2013-10-07 ENCOUNTER — Telehealth: Payer: Self-pay

## 2013-10-07 NOTE — Telephone Encounter (Signed)
Yes. Please call [facility] and see if they will compound generic for Analex.

## 2013-10-07 NOTE — Telephone Encounter (Signed)
I called in the cream to [facility] talked with April

## 2013-10-07 NOTE — Telephone Encounter (Signed)
Pt's drug store does not have the rectal cream(Ana-Lex). Can we see if the [facility] can mix up there cream for him? Please advise

## 2013-10-08 ENCOUNTER — Ambulatory Visit: Payer: BC Managed Care – PPO | Admitting: Gastroenterology

## 2013-10-10 ENCOUNTER — Encounter (HOSPITAL_COMMUNITY): Payer: Self-pay | Admitting: Pharmacy Technician

## 2013-10-10 ENCOUNTER — Encounter: Payer: Self-pay | Admitting: Gastroenterology

## 2013-10-10 ENCOUNTER — Ambulatory Visit (INDEPENDENT_AMBULATORY_CARE_PROVIDER_SITE_OTHER): Payer: BC Managed Care – PPO | Admitting: Gastroenterology

## 2013-10-10 VITALS — BP 123/82 | HR 75 | Temp 98.1°F | Ht 72.0 in | Wt 255.6 lb

## 2013-10-10 DIAGNOSIS — K625 Hemorrhage of anus and rectum: Secondary | ICD-10-CM

## 2013-10-10 DIAGNOSIS — R195 Other fecal abnormalities: Secondary | ICD-10-CM

## 2013-10-10 DIAGNOSIS — K5900 Constipation, unspecified: Secondary | ICD-10-CM

## 2013-10-10 DIAGNOSIS — R159 Full incontinence of feces: Secondary | ICD-10-CM

## 2013-10-10 DIAGNOSIS — K6289 Other specified diseases of anus and rectum: Secondary | ICD-10-CM

## 2013-10-10 MED ORDER — NITROGLYCERIN 0.4 % RE OINT: TOPICAL_OINTMENT | RECTAL | Status: DC

## 2013-10-10 MED ORDER — LUBIPROSTONE 24 MCG PO CAPS: 24.0000 ug | ORAL_CAPSULE | Freq: Two times a day (BID) | ORAL | Status: DC

## 2013-10-10 NOTE — Assessment & Plan Note (Signed)
53 year old gentleman with at least 2 month history of rectal discomfort associated with rectal bleeding, constipation, history of anal receptor during sexual encounter (07/2013). Complains of more long-term intermittent fecal incontinence. Recent bowel change from regular stools to constipation. Patient is on chronic narcotic therapy but states that he had typically been able to manage with daily bowel movements before. On exam he is tender rectal exam, poor anal sphincter tone, questionable mild prolapse. He was Hemoccult-positive. Would be concerned about possibility of anorectal fissure as well as other etiology. He has small palpable lesions within the anal canal possibly hemorrhoids. Cannot exclude some degree of prolapse or proctitis. Discussed at length with the patient he was quite anxious about his condition. Rectum appeared normal on colonoscopy over one year ago. At this point in time we'll manage as anorectal fissure with nitroglycerin ointment, hydrocortisone for possible hemorrhoids. We'll recommend flexible sigmoidoscopy for more diagnostic purposes and to determine course of treatment for patient given chronicity and severity of his ongoing symptoms. Suspect patient would not tolerate anoscope with possible hemorrhoid banding in the office given current discomfort. I have discussed the risks, alternatives, benefits with regards to but not limited to the risk of reaction to medication, bleeding, infection, perforation and the patient is agreeable to proceed. Written consent to be obtained.  Add amitiza 53mcg BID to constipation.

## 2013-10-10 NOTE — Patient Instructions (Addendum)
1. You may have anal fissure or tear. Recommend apply hydrocortisone cream twice daily that you have at home. We will add nitroglycerin ointment twice daily to help healing. When using nitroglycerin, consider premedicating with one tylenol one hour before to reduce likelihood of headache. Be sure to use a glove to avoid exposure of medication to hand. Avoid stendra while on nitro ointment.  2. You may also have mild anorectal prolapse and anal tone. We need to prevent straining.  3. Start amitiza twice daily with meal for constipation. Hopefully this will be a temporary medication.  4. Recommend flexible sigmoidoscopy for further evaluation of your symptoms.

## 2013-10-10 NOTE — Progress Notes (Signed)
Primary Care Physician: Leonides Grills, MD  Primary Gastroenterologist:  Garfield Cornea, MD   Chief Complaint  Patient presents with  . Hemorrhoids    HPI: Jerome [name] is a 53 y.o. male here for complaints of significant rectal pain/rectal bleeding. States his symptoms were occurring for approximately 2 months prior to him contacting our office on 09/29/2013. At that time he was sent a prescription for Anusol suppositories. Prescription for hydrocortisone/lidocaine syndrome March 16 the patient has not been able to get that filled yet. Pharmacy had to order and his insurance would not pay for compounded medication at [facility]. Patient states he has had some chronic issues with fecal incontinence which she relates to history of severe spinal stenosis. He has left-sided radiculopathy of both upper and lower extremities. Up until recently he typically would have a regular morning bowel movement. Now he complains of constipation, [place] 1, multiple times daily. States he has to strain and "change positions" to have a bowel movement. He has pain with defecation. Notes rectal pain most the time including with sitting. He sat on a donut seat for over one month. He's tried since baths. Passing bloody tissue with stools. Reports seeing his PCP approximately a week ago and was told that he "ruptured". He has felt what he feels is hemorrhoids externally at times. States he has an intense pressure in his perineal region and has to stand up to urinate because of the discomfort.   Chronic fecal incontinence. Was better post last back surgery up until recently when his rectal pain began.   Back in January patient reports he was able receptor with a sexual encounter and condom came off. Partner later told patient HIV status. He was given prophylaxis antivirals was seen by Dr. Johnnye Sima. Patient reports that this was his first sexual encounter in 7 years. He denies rectal symptoms related to such.     Current Outpatient Prescriptions  Medication Sig Dispense Refill  . clonazePAM (KLONOPIN) 1 MG tablet Take 1 mg by mouth at bedtime as needed for anxiety (Sleep).       . Flaxseed, Linseed, (FLAX SEED OIL PO) Take 1 tablet by mouth daily.       Marland Kitchen losartan (COZAAR) 50 MG tablet Take 50 mg by mouth daily.       . Multiple Vitamin (MULTIVITAMIN) capsule Take 2 capsules by mouth daily.      . nortriptyline (PAMELOR) 10 MG capsule Take 10 mg by mouth at bedtime.       Marland Kitchen nystatin-triamcinolone (MYCOLOG II) cream       . omeprazole (PRILOSEC) 20 MG capsule Take 20 mg by mouth daily.       . Oxycodone HCl 10 MG TABS Every 4-6 hours.      . pravastatin (PRAVACHOL) 40 MG tablet Take 40 mg by mouth daily.      . sertraline (ZOLOFT) 100 MG tablet Take 100 mg by mouth daily.       Marland Kitchen STENDRA 200 MG TABS       . vitamin B-12 (CYANOCOBALAMIN) 1000 MCG tablet Take 1,000 mcg by mouth daily.      . hydrocortisone (ANUSOL-HC) 25 MG suppository Place 1 suppository (25 mg total) rectally every 12 (twelve) hours.  20 suppository  0  . Lidocaine-Hydrocortisone Ace (ANA-LEX) 2-2 % KIT Place 1 application rectally 2 (two) times daily.  24 each  0   No current facility-administered medications for this visit.    Allergies as of 10/10/2013 -  Review Complete 10/10/2013  Allergen Reaction Noted  . Codeine Nausea And Vomiting 10/14/2012  . Nsaids  07/27/2013   Past Medical History  Diagnosis Date  . DJD (degenerative joint disease)   . Spinal stenosis   . Hypertension   . Obesity   . Thrombocytopenia    Past Surgical History  Procedure Laterality Date  . Laparoscopic gastric sleeve resection  06/2012    Dr Mickie Bail [facility])  . Breast reduction surgery    . Back surgery      x 4  . Colonoscopy N/A 10/14/2012    [id number] rectum and colon   Family History  Problem Relation Age of Onset  . Colon cancer Paternal Grandmother   . Brain cancer Brother 38   History   Social History  . Marital Status:  Divorced    Spouse Name: N/A    Number of Children: 2  . Years of Education: N/A   Occupational History  . 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 Smoking status: Former Smoker -- 0.50 packs/day    Types: Cigarettes    Quit date: 06/12/2012  . Smokeless tobacco: None  . Alcohol Use: No  . Drug Use: No  . Sexual Activity: Yes    Partners: Male   Other Topics Concern  . None   Social History Narrative   Moving to [place] to care for brother w/ brain tumor   Lives w/ 2 daughters & 4 grandsons    ROS:  General: Negative for anorexia, unintentional weight loss, fever, chills, fatigue, weakness. ENT: Negative for hoarseness, difficulty swallowing , nasal congestion. CV: Negative for chest pain, angina, palpitations, dyspnea on exertion, peripheral edema.  Respiratory: Negative for dyspnea at rest, dyspnea on exertion, cough, sputum, wheezing.  GI: See history of present illness. GU:  Negative for dysuria, hematuria, urinary incontinence, urinary frequency, nocturnal urination.  Endo: Negative for unusual weight change.    Physical Examination:   BP 123/82  Pulse 75  Temp(Src) 98.1 F (36.7 C) (Oral)  Ht 6' (1.829 m)  Wt 255 lb 9.6 oz (115.939 kg)  BMI 34.66 kg/m2  General: Well-nourished, well-developed in no acute distress.  Eyes: No icterus. Mouth: Oropharyngeal mucosa moist and pink , no lesions erythema or exudate. Lungs: Clear to auscultation bilaterally.  Heart: Regular rate and rhythm, no murmurs rubs or gallops.  Abdomen: Bowel sounds are normal, nontender, nondistended, no hepatosplenomegaly or masses, no abdominal bruits or hernia , no rebound or guarding.   Rectal: no external lesions. Multiple small palpable lesions in anal canal. Poor anal sphincter tone. ?mild anal prolapse? Secretions heme +.  Extremities: No lower extremity edema. No clubbing or deformities. Neuro: Alert and oriented x 4   Skin: Warm and dry, no jaundice.     Psych: Alert and cooperative, normal mood and affect.

## 2013-10-13 NOTE — Progress Notes (Signed)
cc'd to pcp 

## 2013-10-15 ENCOUNTER — Encounter (HOSPITAL_COMMUNITY)
Admission: RE | Disposition: A | Discharge status: Home / Self Care | Payer: Self-pay | Source: Ambulatory Visit | Attending: Internal Medicine

## 2013-10-15 ENCOUNTER — Encounter (HOSPITAL_COMMUNITY): Payer: Self-pay | Admitting: *Deleted

## 2013-10-15 ENCOUNTER — Ambulatory Visit (HOSPITAL_COMMUNITY)
Admission: RE | Admit: 2013-10-15 | Discharge: 2013-10-15 | Disposition: A | Discharge status: Home / Self Care | Payer: BC Managed Care – PPO | Source: Ambulatory Visit | Attending: Internal Medicine | Admitting: Internal Medicine

## 2013-10-15 DIAGNOSIS — K621 Rectal polyp: Principal | ICD-10-CM

## 2013-10-15 DIAGNOSIS — R195 Other fecal abnormalities: Secondary | ICD-10-CM

## 2013-10-15 DIAGNOSIS — Z79899 Other long term (current) drug therapy: Secondary | ICD-10-CM | POA: Insufficient documentation

## 2013-10-15 DIAGNOSIS — Z6834 Body mass index (BMI) 34.0-34.9, adult: Secondary | ICD-10-CM | POA: Insufficient documentation

## 2013-10-15 DIAGNOSIS — K5900 Constipation, unspecified: Secondary | ICD-10-CM

## 2013-10-15 DIAGNOSIS — K625 Hemorrhage of anus and rectum: Secondary | ICD-10-CM

## 2013-10-15 DIAGNOSIS — K6289 Other specified diseases of anus and rectum: Secondary | ICD-10-CM | POA: Insufficient documentation

## 2013-10-15 DIAGNOSIS — K921 Melena: Secondary | ICD-10-CM

## 2013-10-15 DIAGNOSIS — K626 Ulcer of anus and rectum: Secondary | ICD-10-CM | POA: Insufficient documentation

## 2013-10-15 DIAGNOSIS — E669 Obesity, unspecified: Secondary | ICD-10-CM | POA: Insufficient documentation

## 2013-10-15 DIAGNOSIS — I10 Essential (primary) hypertension: Secondary | ICD-10-CM | POA: Insufficient documentation

## 2013-10-15 DIAGNOSIS — R159 Full incontinence of feces: Secondary | ICD-10-CM

## 2013-10-15 DIAGNOSIS — K620 Anal polyp: Secondary | ICD-10-CM | POA: Insufficient documentation

## 2013-10-15 DIAGNOSIS — Z87891 Personal history of nicotine dependence: Secondary | ICD-10-CM | POA: Insufficient documentation

## 2013-10-15 HISTORY — PX: FLEXIBLE SIGMOIDOSCOPY: SHX5431

## 2013-10-15 SURGERY — SIGMOIDOSCOPY, FLEXIBLE
Anesthesia: Moderate Sedation

## 2013-10-15 MED ORDER — STERILE WATER FOR IRRIGATION IR SOLN
Status: DC | PRN
  Administered 2013-10-15: 09:00:00

## 2013-10-15 MED ORDER — MIDAZOLAM HCL 5 MG/5ML IJ SOLN
INTRAMUSCULAR | Status: AC
  Filled 2013-10-15: qty 10

## 2013-10-15 MED ORDER — MIDAZOLAM HCL 5 MG/5ML IJ SOLN
INTRAMUSCULAR | Status: DC | PRN
  Administered 2013-10-15 (×2): 2 mg via INTRAVENOUS
  Administered 2013-10-15: 1 mg via INTRAVENOUS

## 2013-10-15 MED ORDER — SODIUM CHLORIDE 0.9 % IV SOLN
INTRAVENOUS | Status: DC
  Administered 2013-10-15: 08:00:00 via INTRAVENOUS

## 2013-10-15 MED ORDER — MEPERIDINE HCL 100 MG/ML IJ SOLN
INTRAMUSCULAR | Status: AC
  Filled 2013-10-15: qty 2

## 2013-10-15 MED ORDER — ONDANSETRON HCL 4 MG/2ML IJ SOLN
INTRAMUSCULAR | Status: DC | PRN
  Administered 2013-10-15: 4 mg via INTRAVENOUS

## 2013-10-15 MED ORDER — ONDANSETRON HCL 4 MG/2ML IJ SOLN
INTRAMUSCULAR | Status: AC
  Filled 2013-10-15: qty 2

## 2013-10-15 MED ORDER — MEPERIDINE HCL 100 MG/ML IJ SOLN
INTRAMUSCULAR | Status: DC | PRN
  Administered 2013-10-15: 50 mg via INTRAVENOUS
  Administered 2013-10-15 (×2): 25 mg via INTRAVENOUS

## 2013-10-15 NOTE — Discharge Instructions (Signed)
Standard sigmoidoscopy discharge instructions provided  Stop Amitiza   Continue Ana-lex for now  Further recommendations to follow pending review of pathology report  Colonoscopy, Care After Refer to this sheet in the next few weeks. These instructions provide you with information on caring for yourself after your procedure. Your health care provider may also give you more specific instructions. Your treatment has been planned according to current medical practices, but problems sometimes occur. Call your health care provider if you have any problems or questions after your procedure. WHAT TO EXPECT AFTER THE PROCEDURE  After your procedure, it is typical to have the following:  A small amount of blood in your stool.  Moderate amounts of gas and mild abdominal cramping or bloating. HOME CARE INSTRUCTIONS  Do not drive, operate machinery, or sign important documents for 24 hours.  You may shower and resume your regular physical activities, but move at a slower pace for the first 24 hours.  Take frequent rest periods for the first 24 hours.  Walk around or put a warm pack on your abdomen to help reduce abdominal cramping and bloating.  Drink enough fluids to keep your urine clear or pale yellow.  You may resume your normal diet as instructed by your health care provider. Avoid heavy or fried foods that are hard to digest.  Avoid drinking alcohol for 24 hours or as instructed by your health care provider.  Only take over-the-counter or prescription medicines as directed by your health care provider.  If a tissue sample (biopsy) was taken during your procedure:  Do not take aspirin or blood thinners for 7 days, or as instructed by your health care provider.  Do not drink alcohol for 7 days, or as instructed by your health care provider.  Eat soft foods for the first 24 hours. SEEK MEDICAL CARE IF: You have persistent spotting of blood in your stool 2 3 days after the  procedure. SEEK IMMEDIATE MEDICAL CARE IF:  You have more than a small spotting of blood in your stool.  You pass large blood clots in your stool.  Your abdomen is swollen (distended).  You have nausea or vomiting.  You have a fever.  You have increasing abdominal pain that is not relieved with medicine.

## 2013-10-15 NOTE — Interval H&P Note (Signed)
History and Physical Interval Note:  10/15/2013 8:57 AM  Ander Gaster  has presented today for surgery, with the diagnosis of RECTAL PAIN, RECTAL BLEEDING, INCONTINENCE, CONSTIPATION  The various methods of treatment have been discussed with the patient and family. After consideration of risks, benefits and other options for treatment, the patient has consented to  Procedure(s) with comments: FLEXIBLE SIGMOIDOSCOPY (N/A) - 8:30 as a surgical intervention .  The patient's history has been reviewed, patient examined, no change in status, stable for surgery.  I have reviewed the patient's chart and labs.  Questions were answered to the patient's satisfaction.     No change. Sigmoidoscopy per plan. Risks benefits alternatives reviewed.  Manus Rudd

## 2013-10-15 NOTE — H&P (View-Only) (Signed)
Primary Care Physician: Leonides Grills, MD  Primary Gastroenterologist:  Garfield Cornea, MD   Chief Complaint  Patient presents with  . Hemorrhoids    HPI: Jerome [name] is a 53 y.o. male here for complaints of significant rectal pain/rectal bleeding. States his symptoms were occurring for approximately 2 months prior to him contacting our office on 09/29/2013. At that time he was sent a prescription for Anusol suppositories. Prescription for hydrocortisone/lidocaine syndrome March 16 the patient has not been able to get that filled yet. Pharmacy had to order and his insurance would not pay for compounded medication at [facility]. Patient states he has had some chronic issues with fecal incontinence which she relates to history of severe spinal stenosis. He has left-sided radiculopathy of both upper and lower extremities. Up until recently he typically would have a regular morning bowel movement. Now he complains of constipation, [place] 1, multiple times daily. States he has to strain and "change positions" to have a bowel movement. He has pain with defecation. Notes rectal pain most the time including with sitting. He sat on a donut seat for over one month. He's tried since baths. Passing bloody tissue with stools. Reports seeing his PCP approximately a week ago and was told that he "ruptured". He has felt what he feels is hemorrhoids externally at times. States he has an intense pressure in his perineal region and has to stand up to urinate because of the discomfort.   Chronic fecal incontinence. Was better post last back surgery up until recently when his rectal pain began.   Back in January patient reports he was able receptor with a sexual encounter and condom came off. Partner later told patient HIV status. He was given prophylaxis antivirals was seen by Dr. Johnnye Sima. Patient reports that this was his first sexual encounter in 7 years. He denies rectal symptoms related to such.     Current Outpatient Prescriptions  Medication Sig Dispense Refill  . clonazePAM (KLONOPIN) 1 MG tablet Take 1 mg by mouth at bedtime as needed for anxiety (Sleep).       . Flaxseed, Linseed, (FLAX SEED OIL PO) Take 1 tablet by mouth daily.       Marland Kitchen losartan (COZAAR) 50 MG tablet Take 50 mg by mouth daily.       . Multiple Vitamin (MULTIVITAMIN) capsule Take 2 capsules by mouth daily.      . nortriptyline (PAMELOR) 10 MG capsule Take 10 mg by mouth at bedtime.       Marland Kitchen nystatin-triamcinolone (MYCOLOG II) cream       . omeprazole (PRILOSEC) 20 MG capsule Take 20 mg by mouth daily.       . Oxycodone HCl 10 MG TABS Every 4-6 hours.      . pravastatin (PRAVACHOL) 40 MG tablet Take 40 mg by mouth daily.      . sertraline (ZOLOFT) 100 MG tablet Take 100 mg by mouth daily.       Marland Kitchen STENDRA 200 MG TABS       . vitamin B-12 (CYANOCOBALAMIN) 1000 MCG tablet Take 1,000 mcg by mouth daily.      . hydrocortisone (ANUSOL-HC) 25 MG suppository Place 1 suppository (25 mg total) rectally every 12 (twelve) hours.  20 suppository  0  . Lidocaine-Hydrocortisone Ace (ANA-LEX) 2-2 % KIT Place 1 application rectally 2 (two) times daily.  24 each  0   No current facility-administered medications for this visit.    Allergies as of 10/10/2013 -  Review Complete 10/10/2013  Allergen Reaction Noted  . Codeine Nausea And Vomiting 10/14/2012  . Nsaids  07/27/2013   Past Medical History  Diagnosis Date  . DJD (degenerative joint disease)   . Spinal stenosis   . Hypertension   . Obesity   . Thrombocytopenia    Past Surgical History  Procedure Laterality Date  . Laparoscopic gastric sleeve resection  06/2012    Dr Mickie Bail [facility])  . Breast reduction surgery    . Back surgery      x 4  . Colonoscopy N/A 10/14/2012    [id number] rectum and colon   Family History  Problem Relation Age of Onset  . Colon cancer Paternal Grandmother   . Brain cancer Brother 38   History   Social History  . Marital Status:  Divorced    Spouse Name: N/A    Number of Children: 2  . Years of Education: N/A   Occupational History  . 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 Smoking status: Former Smoker -- 0.50 packs/day    Types: Cigarettes    Quit date: 06/12/2012  . Smokeless tobacco: None  . Alcohol Use: No  . Drug Use: No  . Sexual Activity: Yes    Partners: Male   Other Topics Concern  . None   Social History Narrative   Moving to [place] to care for brother w/ brain tumor   Lives w/ 2 daughters & 4 grandsons    ROS:  General: Negative for anorexia, unintentional weight loss, fever, chills, fatigue, weakness. ENT: Negative for hoarseness, difficulty swallowing , nasal congestion. CV: Negative for chest pain, angina, palpitations, dyspnea on exertion, peripheral edema.  Respiratory: Negative for dyspnea at rest, dyspnea on exertion, cough, sputum, wheezing.  GI: See history of present illness. GU:  Negative for dysuria, hematuria, urinary incontinence, urinary frequency, nocturnal urination.  Endo: Negative for unusual weight change.    Physical Examination:   BP 123/82  Pulse 75  Temp(Src) 98.1 F (36.7 C) (Oral)  Ht 6' (1.829 m)  Wt 255 lb 9.6 oz (115.939 kg)  BMI 34.66 kg/m2  General: Well-nourished, well-developed in no acute distress.  Eyes: No icterus. Mouth: Oropharyngeal mucosa moist and pink , no lesions erythema or exudate. Lungs: Clear to auscultation bilaterally.  Heart: Regular rate and rhythm, no murmurs rubs or gallops.  Abdomen: Bowel sounds are normal, nontender, nondistended, no hepatosplenomegaly or masses, no abdominal bruits or hernia , no rebound or guarding.   Rectal: no external lesions. Multiple small palpable lesions in anal canal. Poor anal sphincter tone. ?mild anal prolapse? Secretions heme +.  Extremities: No lower extremity edema. No clubbing or deformities. Neuro: Alert and oriented x 4   Skin: Warm and dry, no jaundice.     Psych: Alert and cooperative, normal mood and affect.

## 2013-10-15 NOTE — Op Note (Signed)
NAME:  Jerome [name], HOEFLING                ACCOUNT NO.:  [id number]  MEDICAL RECORD NO.:  [id number]  LOCATION:  [facility]                          FACILITY:  [facility]  PHYSICIAN:  R. Garfield Cornea, MD [place]:  03-13-1961  DATE OF PROCEDURE: DATE OF DISCHARGE:                              OPERATIVE REPORT   INDICATIONS FOR PROCEDURE:  The patient is a 53 year old gentleman with proctalgia and rectal bleeding over the past 2 months.  The patient admits to anal receptive intercourse with an HIV-positive patient immediately predating the onset of his symptoms.  Sigmoidoscopy is now being done to further evaluate his symptoms.  Risks, benefits, limitations, alternatives, and imponderables have been discussed. Questions answered.  All parties agreeable.  PROCEDURE NOTE:  O2 saturation, blood pressure, pulse, respirations were monitored throughout the entirety of the procedure.  CONSCIOUS SEDATION:  Versed 5 mg IV and Demerol 100 mg IV in divided doses.  INSTRUMENT:  Pentax video chip colonoscope.  FINDINGS:  Digital rectal exam revealed no abnormalities.  The external inspection of the anus demonstrated no abnormalities.  ENDOSCOPIC FINDINGS.:  Prep adequate for sigmoidoscopy.  Examination of the rectal mucosa including retroflexed view of the anal verge demonstrated markedly abnormal distal rectal mucosa circumferentially within about 3 cm in from the anal verge.  There was marked mucosal edema, friability and a geographic ulceration of the very distal rectal Mucosa, again, circumferentially around the distal rectum/an0-rectal junction.  Proximal to this area, the rectum appeared entirely normal except for a single 3 mm polyp at 6 cm from the anal verge.  The scope was advanced in a nice one-to-one fashion at 30 cm.  The distal sigmoid segment appeared normal.  THERAPEUTIC/DIAGNOSTIC MANEUVERS PERFORMED:  The rectal polyp was cold biopsied and essentially removed with this  maneuver.  Subsequently, the abnormal distal rectal mucosa was biopsied multiple times and  specimen submitted separately for the pathologist.  The patient tolerated the procedure well.  IMPRESSION: 1. Markedly inflamed very distal rectum with ulceration as described     above, status post biopsy. 2. Rectal polyp, status post cold biopsy/removal.  RECOMMENDATIONS:  As the patient is experiencing diarrhea with Amitiza, I recommended he stop this medication.  He may continue Ana-Lex topically for the time being.  Further recommendations to follow, pending review of pathology report.     Bridgette Habermann, MD [facility]     RMR/MEDQ  D:  10/15/2013  T:  10/15/2013  Job:  [id number]  cc:   Doroteo Bradford. Johnnye Sima, M.D. Fax: [phone]

## 2013-10-21 ENCOUNTER — Telehealth: Payer: Self-pay | Admitting: *Deleted

## 2013-10-21 ENCOUNTER — Encounter (HOSPITAL_COMMUNITY): Payer: Self-pay | Admitting: Internal Medicine

## 2013-10-21 NOTE — Telephone Encounter (Signed)
Pt is aware. He only has about 6 days left of the ana-lex cream and he would like an rx sent to [facility]. He has switched from [facility] to [facility].   Chelsey, please cc pcp/Dr.Hatcher Manuela Schwartz, please nic tcs in 9 years

## 2013-10-21 NOTE — Telephone Encounter (Signed)
Pt would like to speak with the nurse to get a rundown about the procedure he had done 10/15/13 or if Dr. Gala Romney seen anything I made pt aware that it takes 7 business days to get the results back pt understood, pt has cream for his hemorrhoids, pt stated he is going to see his brother in [place] he has brain cancer today and pt wanted to know if there is anything else he can do besides just using the cream. Please advise [phone]

## 2013-10-21 NOTE — Telephone Encounter (Signed)
Notes Recorded by Daneil Dolin, MD on 10/20/2013 at 8:39 AM Biopsies revealed acute ulceration. No evidence of infectious process. Recommend avoid any activity which may produce trauma to the anorectum. Continue Ana-Lex for another month. Continue Amitiza. Avoid straining. Anticipate gradual resolution of symptoms over the coming weeks. If not, let me know. Polyp removed was benign. Recommend repeat colonoscopy in 9 years for screening purposes. Please let patient know. CC PCP/ Dr. Johnnye Sima

## 2013-10-22 MED ORDER — LIDOCAINE-HYDROCORTISONE ACE 2-2 % RE KIT: 1.0000 | PACK | Freq: Two times a day (BID) | RECTAL | Status: DC

## 2013-10-22 NOTE — Telephone Encounter (Signed)
rx for ana-lex done.

## 2013-10-22 NOTE — Telephone Encounter (Signed)
Routing to [facility] for rx

## 2013-10-22 NOTE — Addendum Note (Signed)
Addended by: Mahala Menghini on: 10/22/2013 09:55 AM   Modules accepted: Orders

## 2013-10-22 NOTE — Telephone Encounter (Signed)
Cc'd to pcp and Dr. Johnnye Sima

## 2014-03-25 ENCOUNTER — Telehealth: Payer: Self-pay | Admitting: Cardiovascular Disease

## 2014-03-25 NOTE — Telephone Encounter (Signed)
Close encounter 

## 2014-03-27 ENCOUNTER — Ambulatory Visit (INDEPENDENT_AMBULATORY_CARE_PROVIDER_SITE_OTHER): Payer: BC Managed Care – PPO | Admitting: Cardiovascular Disease

## 2014-03-27 ENCOUNTER — Encounter: Payer: Self-pay | Admitting: Cardiovascular Disease

## 2014-03-27 VITALS — BP 120/68 | HR 77 | Ht 73.0 in | Wt 260.0 lb

## 2014-03-27 DIAGNOSIS — I83893 Varicose veins of bilateral lower extremities with other complications: Secondary | ICD-10-CM

## 2014-03-27 DIAGNOSIS — G4730 Sleep apnea, unspecified: Secondary | ICD-10-CM

## 2014-03-27 DIAGNOSIS — I83812 Varicose veins of left lower extremities with pain: Secondary | ICD-10-CM

## 2014-03-27 DIAGNOSIS — E785 Hyperlipidemia, unspecified: Secondary | ICD-10-CM

## 2014-03-27 DIAGNOSIS — I10 Essential (primary) hypertension: Secondary | ICD-10-CM

## 2014-03-27 NOTE — Patient Instructions (Signed)
Your physician recommends that you schedule a follow-up appointment in: as needed     You have been referred to Dr.Edward Luan Pulling for CPAP titration   You have been referred to [facility]  Dr.Charles Fields      Thank you for choosing [facility] !

## 2014-03-27 NOTE — Progress Notes (Signed)
Patient ID: Jerome [name], male   DOB: 1961/02/03, 53 y.o.   MRN: [id number]      SUBJECTIVE: The patient is a 53 year old male who I am evaluating for the first time. He has a history of morbid obesity and underwent surgery for this. He also has severe obstructive sleep apnea and uses BiPAP. He also has essential hypertension and hyperlipidemia. He reportedly underwent a nuclear myocardial perfusion study on 05/23/2012 which demonstrated normal perfusion without evidence for ischemia or scar with a post stress ejection fraction of 60% with normal wall motion. He reportedly underwent a suboptimal echocardiogram in January 2012 which did not reveal any significant valvular pathology. Left ventricular ejection fraction was described as grossly normal wall motion could not be accurately assessed. There was mild LVH and probable diastolic dysfunction. I reviewed the official test results of both his echocardiogram and nuclear stress test.  He underwent sleeve gastrectomy in December 2013. He had been smoking for 30 years and quit smoking in November 2013. He denies chest pain. He used to have palpitations while taking nortriptyline but has stopped doing so. He has not followed up with a sleep specialist in several years and needs a titration for his BiPAP. He complains of left calf heaviness and has noticed pronounced venous varicosities, which both his father and brother also have.  ECG performed in the office today demonstrates normal sinus rhythm with a heart rate of 75 beats per minute.   Review of Systems: As per "subjective", otherwise negative.  Allergies  Allergen Reactions  . Aspirin Nausea And Vomiting    Due to gastric sleeve surgery   . Codeine Nausea And Vomiting and Nausea Only  . Nsaids Anxiety    Due to gastric sleeve surgery  N/V    Current Outpatient Prescriptions  Medication Sig Dispense Refill  . acetaminophen (TYLENOL) 500 MG tablet Take 1,000 mg by mouth every 6 (six)  hours as needed.      . calcium citrate-vitamin D (CITRACAL+D) 315-200 MG-UNIT per tablet Take 1 tablet by mouth 2 (two) times daily.      . cetirizine (ZYRTEC) 10 MG tablet Take 10 mg by mouth as needed for allergies.      . cholecalciferol (VITAMIN D) 400 UNITS TABS tablet Take 400 Units by mouth daily.      . clonazePAM (KLONOPIN) 1 MG tablet Take 1 mg by mouth at bedtime as needed for anxiety (Sleep).       . cyclobenzaprine (FLEXERIL) 10 MG tablet Take 10 mg by mouth as needed for muscle spasms.      . Flaxseed, Linseed, (FLAX SEED OIL PO) Take 1 tablet by mouth daily.       . Lidocaine-Hydrocortisone Ace (ANA-LEX) 2-2 % KIT Place 1 applicator rectally 2 (two) times daily.  60 each  0  . losartan (COZAAR) 50 MG tablet Take 50 mg by mouth daily.       Marland Kitchen lubiprostone (AMITIZA) 24 MCG capsule Take 1 capsule (24 mcg total) by mouth 2 (two) times daily with a meal.  60 capsule  5  . Multiple Vitamin (MULTIVITAMIN) capsule Take 2 capsules by mouth daily.      . Nitroglycerin (RECTIV) 0.4 % OINT Apply one inch line of medication anorectally twice per day for three weeks. Use glove to avoid exposure of medication to hand.  30 g  0  . nortriptyline (PAMELOR) 10 MG capsule Take 10 mg by mouth at bedtime.       Marland Kitchen nystatin-triamcinolone (  MYCOLOG II) cream       . omeprazole (PRILOSEC) 20 MG capsule Take 20 mg by mouth daily.       . Oxycodone HCl 10 MG TABS Every 4-6 hours.      . pravastatin (PRAVACHOL) 40 MG tablet Take 40 mg by mouth daily.      . sertraline (ZOLOFT) 100 MG tablet Take 100 mg by mouth daily.       Marland Kitchen STENDRA 200 MG TABS       . vitamin B-12 (CYANOCOBALAMIN) 1000 MCG tablet Take 1,000 mcg by mouth daily.       No current facility-administered medications for this visit.    Past Medical History  Diagnosis Date  . DJD (degenerative joint disease)   . Spinal stenosis   . Hypertension   . Obesity   . Thrombocytopenia     Past Surgical History  Procedure Laterality Date  .  Laparoscopic gastric sleeve resection  06/2012    Dr Mickie Bail [facility])  . Breast reduction surgery    . Back surgery      x 4  . Colonoscopy N/A 10/14/2012    HJS:[id number] rectum and colon  . Flexible sigmoidoscopy N/A 10/15/2013    Procedure: FLEXIBLE SIGMOIDOSCOPY;  Surgeon: Daneil Dolin, MD;  Location: [facility];  Service: Endoscopy;  Laterality: N/A;  8:30    History   Social History  . Marital Status: Divorced    Spouse Name: N/A    Number of Children: 2  . Years of Education: N/A   Occupational History  . 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 Smoking status: Former Smoker -- 0.50 packs/day    Types: Cigarettes    Quit date: 06/12/2012  . Smokeless tobacco: Not on file  . Alcohol Use: No  . Drug Use: No  . Sexual Activity: Yes    Partners: Male   Other Topics Concern  . Not on file   Social History Narrative   Moving to [place] to care for brother w/ brain tumor   Lives w/ 2 daughters & 4 grandsons     Filed Vitals:   03/27/14 1527  BP: 120/68  Pulse: 77  Height: '6\' 1"'  (1.854 m)  Weight: 260 lb (117.935 kg)    PHYSICAL EXAM General: NAD HEENT: Normal. Neck: No JVD, no thyromegaly. Lungs: Clear to auscultation bilaterally with normal respiratory effort. CV: Nondisplaced PMI.  Regular rate and rhythm, normal S1/S2, no S3/S4, no murmur. No pretibial or periankle edema.  No carotid bruit.  Normal pedal pulses.  Abdomen: Soft, nontender, no hepatosplenomegaly, no distention.  Neurologic: Alert and oriented x 3.  Psych: Normal affect. Skin: Normal. Musculoskeletal: Normal range of motion, no gross deformities. Extremities: No clubbing or cyanosis. Marked venous varicosities of left calf.  ECG: Most recent ECG reviewed.      ASSESSMENT AND PLAN: 1. Left leg heaviness/venous varicosities: He may require venous ablation. I will make referral to [facility] in [place].  2. Essential HTN: Controlled on present therapy which  includes losartan 50 mg daily.  3. Hyperlipidemia: Taking pravastatin 40 mg daily.  4. Sleep apnea: On BiPAP but without a formal evaluation in several years and requires titration. I will make a referral to a sleep specialist.  Dispo: He does not appear to have any significant cardiac disease. He can follow up with me as needed.  Kate Sable, M.D., F.A.C.C.

## 2014-03-27 NOTE — Addendum Note (Signed)
Addended by: Barbarann Ehlers A on: 03/27/2014 04:24 PM   Modules accepted: Orders

## 2014-03-31 NOTE — Addendum Note (Signed)
Addended by: Barbarann Ehlers A on: 03/31/2014 01:42 PM   Modules accepted: Orders

## 2014-04-06 ENCOUNTER — Other Ambulatory Visit: Payer: Self-pay

## 2014-04-06 DIAGNOSIS — M79609 Pain in unspecified limb: Secondary | ICD-10-CM

## 2014-04-06 DIAGNOSIS — I83893 Varicose veins of bilateral lower extremities with other complications: Secondary | ICD-10-CM

## 2014-04-28 ENCOUNTER — Encounter: Payer: Self-pay | Admitting: Vascular Surgery

## 2014-04-29 ENCOUNTER — Encounter (HOSPITAL_COMMUNITY): Payer: BC Managed Care – PPO

## 2014-04-29 ENCOUNTER — Encounter: Payer: BC Managed Care – PPO | Admitting: Vascular Surgery

## 2014-05-01 ENCOUNTER — Encounter: Payer: Self-pay | Admitting: Vascular Surgery

## 2014-05-04 ENCOUNTER — Encounter: Payer: Self-pay | Admitting: Vascular Surgery

## 2014-05-04 ENCOUNTER — Ambulatory Visit (HOSPITAL_COMMUNITY)
Admission: RE | Admit: 2014-05-04 | Discharge: 2014-05-04 | Disposition: A | Discharge status: Home / Self Care | Payer: BC Managed Care – PPO | Source: Ambulatory Visit | Attending: Vascular Surgery | Admitting: Vascular Surgery

## 2014-05-04 ENCOUNTER — Ambulatory Visit (INDEPENDENT_AMBULATORY_CARE_PROVIDER_SITE_OTHER): Payer: BC Managed Care – PPO | Admitting: Vascular Surgery

## 2014-05-04 VITALS — BP 123/80 | HR 70 | Resp 16 | Ht 73.0 in | Wt 255.0 lb

## 2014-05-04 DIAGNOSIS — I83899 Varicose veins of unspecified lower extremities with other complications: Secondary | ICD-10-CM | POA: Insufficient documentation

## 2014-05-04 DIAGNOSIS — I8393 Asymptomatic varicose veins of bilateral lower extremities: Secondary | ICD-10-CM | POA: Insufficient documentation

## 2014-05-04 DIAGNOSIS — I83892 Varicose veins of left lower extremities with other complications: Secondary | ICD-10-CM

## 2014-05-04 DIAGNOSIS — M79609 Pain in unspecified limb: Secondary | ICD-10-CM | POA: Diagnosis not present

## 2014-05-04 NOTE — Progress Notes (Signed)
Subjective:     Patient ID: Jerome [name], male   DOB: [birth year], 53 y.o.   MRN: [id number]  HPI this 53 year old male was referred for evaluation of symptomatic varicose veins in the left leg. He has bulging varicosities which have been present for several years and are causing increasing aching throbbing and burning discomfort as well as swelling in the left leg. He does not wear elastic compression stockings or elevate his legs on a regular basis. He has no history of DVT thrombophlebitis bleeding or stasis ulcers. He did undergo bariatric surgery for morbid obesity a few years ago and has lost 120 pounds. He has no history of coagulopathy.  Past Medical History  Diagnosis Date  . DJD (degenerative joint disease)   . Spinal stenosis   . Hypertension   . Obesity   . Thrombocytopenia     History  Substance Use Topics  . Smoking status: Former Smoker -- 0.50 packs/day    Types: Cigarettes    Quit date: 06/12/2012  . Smokeless tobacco: Not on file  . Alcohol Use: No    Family History  Problem Relation Age of Onset  . Colon cancer Paternal Grandmother   . Brain cancer Brother 56    Allergies  Allergen Reactions  . Aspirin Nausea And Vomiting    Due to gastric sleeve surgery   . Codeine Nausea And Vomiting and Nausea Only  . Nsaids Anxiety    Due to gastric sleeve surgery  N/V    Current outpatient prescriptions:acetaminophen (TYLENOL) 500 MG tablet, Take 1,000 mg by mouth every 6 (six) hours as needed., Disp: , Rfl: ;  calcium citrate-vitamin D (CITRACAL+D) 315-200 MG-UNIT per tablet, Take 1 tablet by mouth 2 (two) times daily., Disp: , Rfl: ;  cetirizine (ZYRTEC) 10 MG tablet, Take 10 mg by mouth as needed for allergies., Disp: , Rfl:  cholecalciferol (VITAMIN D) 400 UNITS TABS tablet, Take 400 Units by mouth daily., Disp: , Rfl: ;  clonazePAM (KLONOPIN) 1 MG tablet, Take 1 mg by mouth at bedtime as needed for anxiety (Sleep). , Disp: , Rfl: ;  cyclobenzaprine (FLEXERIL) 10  MG tablet, Take 10 mg by mouth as needed for muscle spasms., Disp: , Rfl: ;  Flaxseed, Linseed, (FLAX SEED OIL PO), Take 1 tablet by mouth daily. , Disp: , Rfl:  Lidocaine-Hydrocortisone Ace (ANA-LEX) 2-2 % KIT, Place 1 applicator rectally 2 (two) times daily., Disp: 60 each, Rfl: 0;  losartan (COZAAR) 50 MG tablet, Take 50 mg by mouth daily. , Disp: , Rfl: ;  lubiprostone (AMITIZA) 24 MCG capsule, Take 1 capsule (24 mcg total) by mouth 2 (two) times daily with a meal., Disp: 60 capsule, Rfl: 5;  Multiple Vitamin (MULTIVITAMIN) capsule, Take 2 capsules by mouth daily., Disp: , Rfl:  Nitroglycerin (RECTIV) 0.4 % OINT, Apply one inch line of medication anorectally twice per day for three weeks. Use glove to avoid exposure of medication to hand., Disp: 30 g, Rfl: 0;  nystatin-triamcinolone (MYCOLOG II) cream, , Disp: , Rfl: ;  omeprazole (PRILOSEC) 20 MG capsule, Take 20 mg by mouth daily. , Disp: , Rfl: ;  Oxycodone HCl 10 MG TABS, Every 4-6 hours., Disp: , Rfl:  pravastatin (PRAVACHOL) 40 MG tablet, Take 40 mg by mouth daily., Disp: , Rfl: ;  sertraline (ZOLOFT) 100 MG tablet, Take 100 mg by mouth daily. , Disp: , Rfl: ;  STENDRA 200 MG TABS, , Disp: , Rfl: ;  vitamin B-12 (CYANOCOBALAMIN) 1000 MCG tablet, Take 1,000  mcg by mouth daily., Disp: , Rfl: ;  nortriptyline (PAMELOR) 10 MG capsule, Take 10 mg by mouth at bedtime. , Disp: , Rfl:   BP 123/80  Pulse 70  Resp 16  Ht _0  (1.854 m)  Wt 255 lb (115.667 kg)  BMI 33.65 kg/m2  Body mass index is 33.65 kg/(m^2).          Review of Systems denies chest pain but does have mild dyspnea on exertion. Has leg pain with ambulation, chronic pain in his left leg due to 2 radiculopathy. Has had previous surgery on lumbar spine. Has history of sleep apnea and bariatric surgery with 120 pound weight loss. Other systems negative and a complete review of     Objective:   Physical Exam BP 123/80  Pulse 70  Resp 16  Ht _1  (1.854 m)  Wt 255 lb  (115.667 kg)  BMI 33.65 kg/m2  Gen.-alert and oriented x3 in no apparent distress -obese HEENT normal for age Lungs no rhonchi or wheezing Cardiovascular regular rhythm no murmurs carotid pulses 3+ palpable no bruits audible Abdomen soft nontender no palpable masses Musculoskeletal free of  major deformities Skin clear -no rashes Neurologic normal Lower extremities 3+ femoral and dorsalis pedis pulses palpable bilaterally with no edema on right side 1+ edema left leg. Bulging varicosities left leg beginning in distal medial thigh extending medially into calf as well as posterior thigh into the lateral calf region. No active ulceration noted. 3+ dorsalis pedis pulse palpable.  Data ordered a venous duplex exam of the left leg which are reviewed and interpreted. There is gross reflux in the left greater saphenous vein from the mid calf to near the saphenofemoral junction but no reflux in the small saphenous vein and no DVT.       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

## 2014-05-08 ENCOUNTER — Other Ambulatory Visit: Payer: Self-pay

## 2014-07-27 ENCOUNTER — Ambulatory Visit (HOSPITAL_COMMUNITY)
Admission: RE | Admit: 2014-07-27 | Discharge: 2014-07-27 | Disposition: A | Discharge status: Home / Self Care | Payer: 59 | Source: Ambulatory Visit | Attending: Family Medicine | Admitting: Family Medicine

## 2014-07-27 DIAGNOSIS — R200 Anesthesia of skin: Secondary | ICD-10-CM | POA: Diagnosis not present

## 2014-07-27 DIAGNOSIS — M6281 Muscle weakness (generalized): Secondary | ICD-10-CM

## 2014-07-27 DIAGNOSIS — M62541 Muscle wasting and atrophy, not elsewhere classified, right hand: Secondary | ICD-10-CM

## 2014-07-27 DIAGNOSIS — R278 Other lack of coordination: Secondary | ICD-10-CM | POA: Insufficient documentation

## 2014-07-27 DIAGNOSIS — M62542 Muscle wasting and atrophy, not elsewhere classified, left hand: Secondary | ICD-10-CM | POA: Diagnosis present

## 2014-07-27 DIAGNOSIS — R279 Unspecified lack of coordination: Secondary | ICD-10-CM

## 2014-07-27 NOTE — Patient Instructions (Signed)
Home Exercises Program Theraputty Exercises  Do the following exercises 5 times a day using your affected hand.  1. Roll putty into a ball.  2. Make into a pancake.  3. Roll putty into a roll.  4. Pinch along log with first finger and thumb.   5. Make into a ball.  6. Roll it back into a log.   7. Pinch using thumb and side of first finger.  8. Roll into a ball, then flatten into a pancake. 9. Using your fingers, make putty into a mountain.  Fine Motor Coordination Exercises  Perform the following exercises 5 times a day, as recommended by your occupational therapist.  Close all fingers and thumb into a tight fist and then open wide. (10 times) Palm of hand on table, spread fingers apart, then together. (10 times) Lift fingers and thumb off table one at a time. Increase speed as able. (10 times) Touch thumb to each fingertip. Increase speed as able. (10 times) Thumb circles. (10 times) Pick up 5 small objects (coins, marbles, paperclips, beads, etc.) one at a time and hold them in hand, then place them one by one onto the table. Pick up small objects and place them into a cup or container. Place clothespins onto the edge of a cup, can, or container. Play card games. Practice shuffling and dealing cards. Flip cards over onto table one by one. Use scissors to cut paper.

## 2014-07-27 NOTE — Therapy (Signed)
[place], [place], [address] Phone: [phone]   Fax:  [phone]  Occupational Therapy Evaluation  Patient Details  Name: Jerome [name] MRN: [id number] Date of Birth: 28-Nov-1960  Encounter Date: 07/27/2014      OT End of Session - 07/27/14 1648    Visit Number 1   Number of Visits 16   Date for OT Re-Evaluation 08/24/14   Authorization Type n/a   OT Start Time 1120   OT Stop Time 1200   OT Time Calculation (min) 40 min   Activity Tolerance Patient tolerated treatment well   Behavior During Therapy [facility] for tasks assessed/performed      Past Medical History  Diagnosis Date  . DJD (degenerative joint disease)   . Spinal stenosis   . Hypertension   . Obesity   . Thrombocytopenia     Past Surgical History  Procedure Laterality Date  . Laparoscopic gastric sleeve resection  06/2012    Dr Mickie Bail [facility])  . Breast reduction surgery    . Back surgery      x 4  . Colonoscopy N/A 10/14/2012    CHY:IFOYDX rectum and colon  . Flexible sigmoidoscopy N/A 10/15/2013    Procedure: FLEXIBLE SIGMOIDOSCOPY;  Surgeon: Daneil Dolin, MD;  [facility];  Service: Endoscopy;  Laterality: N/A;  8:30    There were no vitals taken for this visit.  Visit Diagnosis:  Hand muscle atrophy, left  Hand muscle atrophy, right  Lack of coordination  Hand muscle weakness      Subjective Assessment - 07/27/14 1631    Symptoms S:  I want to be able to build up the muscles in my hands and stop dropping things.   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  Patient has been experiencing numbness and decreased strength in his left hand greater than his right hand for a few years.  Typically recieves care at [facility].   Special Tests 41.5 on FOTO   Currently in Pain? Yes   Pain  Score 6    Pain Location Hand   Pain Orientation Left   Pain Descriptors / Indicators Burning   Pain Onset More than a month ago   Pain Relieving Factors n/a          [facility] OT Assessment - 07/27/14 0001    Assessment   Diagnosis Bilateral Hand Atrophy   Onset Date --  several years ago   Assessment Bilateral Hand Atrophy   Prior Therapy yes at [facility]   Precautions   Precautions Fall   Required Braces or Orthoses --  has an AFO   Restrictions   Weight Bearing Restrictions No   Balance Screen   Has the patient fallen in the past 6 months Yes   How many times? 2   Has the patient had a decrease in activity level because of a fear of falling?  No   Is the patient reluctant to leave their home because of a fear of falling?  No   Home  Environment   Family/patient expects to be discharged to: Private residence   Living Arrangements Children   Available Help at Discharge Family   Type of [place] With Daughter  and his 3 grandchildren   Prior Function   Level of Independence Independent with basic ADLs;Needs assistance with gait  uses  a cane or a walker   Vocation On disability   Leisure enjoys walking in his neighborhood or at [employer]   ADL   ADL comments patient has difficulty with closures on his pants and shirts (buttons, zippers), her drops items frequently, handwriting is worsening, unable to type,    IADL   Light Housekeeping Does not participate in any housekeeping tasks   Mobility   Mobility Status Needs assist   Mobility Status Comments uses cane or walker   Written Expression   Dominant Hand Right   Handwriting 75% legible;Increased time   Vision - History   Baseline Vision Wears glasses all the time   Activity Tolerance   Activity Tolerance Comments WFL for tasks assessed   Cognition   Overall Cognitive Status Within Functional Limits for tasks assessed   Observation/Other Assessments   Observations atrophy of thenar eminence and webspace of left  hand   Focus on Therapeutic Outcomes (FOTO)  45   Sensation   Light Touch Impaired Detail   Light Touch Impaired Details Impaired RUE;Impaired LUE   Semmes Weinstein Monofilament Scale --  right 4.31-4.56 left 6.65   Hot/Cold Impaired Detail   Hot/Cold Impaired Details Impaired RUE;Impaired LUE   Coordination   Gross Motor Movements are Fluid and Coordinated Yes   9 Hole Peg Test (p) right 24.29" left 36.54" used compensatory arm movements to mainipulate pegs and dropped 2 pegs   Other able to oppose his small digit with right hand.  able to oppose to index finger with left thumb only.  difficulty adducting digits and is able to make a fist with both hands   Strength   Grip (lbs) 60   Right Hand Lateral Pinch 12 lbs   Right Hand 3 Point Pinch 8 lbs   Grip (lbs) 15   Left Hand Lateral Pinch 2 lbs   Left Hand 3 Point Pinch 6 lbs                       OT Education - 07/27/14 1647    Education provided Yes   Education Details theraputty exercises, fine motor exercises    Person(s) Educated Patient   Methods Explanation;Handout   Comprehension Verbalized understanding          OT Short Term Goals - 07/27/14 1651    OT SHORT TERM GOAL #1   Title Patient will be educated on a HEP.   Time 4   Period Weeks   Status New   OT SHORT TERM GOAL #2   Title Patient will improve bilateral grip strength by 10 pounds and pinch strength by 2 pounds for increased abiilty to maintain grasp on objects.   Time 4   Period Weeks   Status New   OT SHORT TERM GOAL #3   Title Patient will improve fine motor coordination by decreasing completion time on the Nine Hole Peg Test by 5 seconds for increased ability to manipulate zippers and buttons.   Time 4   Period Weeks   Status New   OT SHORT TERM GOAL #4   Title Patient will improve sensation in right hand to diminished protective sensation and left hand to loss of protective sensation.   Time 4   Period Weeks   Status New            OT Long Term Goals - 07/27/14 1653    OT LONG TERM GOAL #1   Title Patient will be able to fasten  his own pants and hold his new grandbaby safely, as well as complete all BADLs with highest level of independence possible.   Time 8   Period Weeks   Status New   OT LONG TERM GOAL #2   Title Patient will improve bilateral grip strength by 15 pounds and pinch strength by 4 pounds for increased abiilty to maintain grasp on objects.   Time 8   Period Weeks   Status New   OT LONG TERM GOAL #3   Title Patient will improve fine motor coordination by decreasing completion time on the Nine Hole Peg Test by 10 seconds for increased ability to manipulate zippers and buttons.   Time 8   Period Weeks   Status New   OT LONG TERM GOAL #4   Title Patient will improve sensation in bilateral hands to diminished protective sensation for increased safety while completing ADLs.   Time 8   Period Weeks   Status New               Plan - 07/27/14 1648    Clinical Impression Statement A:  Patient presents with diagnosis of bilateral hand atrophy, left greater than right, secondary to cervical spondylitic myelopathy.  Patient presents with decreased fine motor coordination, decreased strength, decreased sensation in both hands, left greater than right.  These deficits are causing patient to have decreased independence with BADLs.    Rehab Potential Good   OT Frequency 2x / week   OT Duration 8 weeks   OT Treatment/Interventions Self-care/ADL training;Therapeutic exercises;Therapeutic activities;Patient/family education;Manual Therapy   Plan P:  Skilled OT intervention to improve patients strength, coordination, and sensation in his hands for increased safety and independence with his BADL completion.    Consulted and Agree with Plan of Care Patient        Problem List Patient Active Problem List   Diagnosis Date Noted  . Varicose veins of leg with complications 54/65/6812  . Rectal  pain 10/10/2013  . Rectal bleeding 10/10/2013  . Unspecified constipation 10/10/2013  . Fecal incontinence 10/10/2013  . Heme positive stool 10/10/2013  . OSA on CPAP 08/25/2013  . HIV exposure 07/29/2013  . Encounter for screening colonoscopy 10/10/2012  . Hemorrhoids 10/10/2012    Vangie Bicker, OTR/L [phone]  07/27/2014, 4:58 PM  [place] [address] [place], [place], [address] Phone: [phone]   Fax:  [phone]

## 2014-07-29 ENCOUNTER — Ambulatory Visit (HOSPITAL_COMMUNITY)
Admission: RE | Admit: 2014-07-29 | Discharge: 2014-07-29 | Disposition: A | Discharge status: Home / Self Care | Payer: 59 | Source: Ambulatory Visit | Attending: Family Medicine | Admitting: Family Medicine

## 2014-07-29 DIAGNOSIS — R279 Unspecified lack of coordination: Secondary | ICD-10-CM

## 2014-07-29 DIAGNOSIS — M6281 Muscle weakness (generalized): Secondary | ICD-10-CM

## 2014-07-29 DIAGNOSIS — M62542 Muscle wasting and atrophy, not elsewhere classified, left hand: Secondary | ICD-10-CM

## 2014-07-29 DIAGNOSIS — M62541 Muscle wasting and atrophy, not elsewhere classified, right hand: Secondary | ICD-10-CM

## 2014-07-29 NOTE — Therapy (Signed)
Jerome [name], [place], [address] Phone: [phone]   Fax:  [phone]  Occupational Therapy Treatment  Patient Details  Name: Jerome [name] MRN: [id number] Date of Birth: October 09, 1960  Encounter Date: 07/29/2014      OT End of Session - 07/29/14 1202    Visit Number 2   Number of Visits 16   Date for OT Re-Evaluation 08/24/14   OT Start Time 1109   OT Stop Time 1159   OT Time Calculation (min) 50 min   Activity Tolerance Patient tolerated treatment well   Behavior During Therapy [facility] for tasks assessed/performed      Past Medical History  Diagnosis Date  . DJD (degenerative joint disease)   . Spinal stenosis   . Hypertension   . Obesity   . Thrombocytopenia     Past Surgical History  Procedure Laterality Date  . Laparoscopic gastric sleeve resection  06/2012    Dr Mickie Bail [facility])  . Breast reduction surgery    . Back surgery      x 4  . Colonoscopy N/A 10/14/2012    EEF:EOFHQR rectum and colon  . Flexible sigmoidoscopy N/A 10/15/2013    Procedure: FLEXIBLE SIGMOIDOSCOPY;  Surgeon: Daneil Dolin, MD;  [facility];  Service: Endoscopy;  Laterality: N/A;  8:30    There were no vitals taken for this visit.  Visit Diagnosis:  Hand muscle atrophy, left  Hand muscle atrophy, right  Lack of coordination  Hand muscle weakness      Subjective Assessment - 07/29/14 1110    Symptoms S;  I stayed in bed yesterday and didnt do most of my exercises.   Currently in Pain? Yes   Pain Score 9    Pain Location Head   Pain Descriptors / Indicators Aching   Pain Type Acute pain   Pain Onset More than a month ago   Pain Frequency Intermittent                 OT Treatments/Exercises (OP) - 07/29/14 1111    Exercises   Exercises Hand   Hand Exercises   MCPJ Extension AROM;Both;10 reps  all digits   Digit Composite ABduction Both;AROM;5 reps   Digit Composite ADduction Both;AROM;5  reps   Digit Composite Adduction Limitations left thumb and small digit max diffiiculty, tapping to facilitate thumb musculature   Theraputty - Pinch digit adduction with yellow theraputty   Sponges right 16, 19, 24, left 22, 32 compensating with table to smoosh sponges, 10 without compensating   Other Hand Exercises wrist flexion and extension, forearm supination and pronation 10 times each AROM    Other Hand Exercises pinch tree red and yellow, focusing on lateral pinch to place and 3 point pinch to remove    Fine Motor Coordination   In Hand Manipulation Training left hand with large and small shot gun shells placing into holding container, very little compensatory movements   Tendon Glides 5 times bilateral hands, left hand with mod-max difficulty to control movements                OT Education - 07/29/14 1202    Education Details issued yellow theraputty to complete digit exercises with (patient had green at home)   Person(s) Educated Patient          OT Short Term Goals - 07/29/14 1205    OT SHORT TERM GOAL #1   Title Patient will be educated on  a HEP.   Time 4   Period Weeks   Status On-going   OT SHORT TERM GOAL #2   Title Patient will improve bilateral grip strength by 10 pounds and pinch strength by 2 pounds for increased abiilty to maintain grasp on objects.   Time 4   Period Weeks   Status On-going   OT SHORT TERM GOAL #3   Title Patient will improve fine motor coordination by decreasing completion time on the Nine Hole Peg Test by 5 seconds for increased ability to manipulate zippers and buttons.   Time 4   Period Weeks   Status On-going   OT SHORT TERM GOAL #4   Title Patient will improve sensation in right hand to diminished protective sensation and left hand to loss of protective sensation.   Time 4   Period Weeks   Status On-going           OT Long Term Goals - 07/29/14 1206    OT LONG TERM GOAL #1   Title Patient will be able to fasten his  own pants and hold his new grandbaby safely, as well as complete all BADLs with highest level of independence possible.   Time 8   Period Weeks   Status On-going   OT LONG TERM GOAL #2   Title Patient will improve bilateral grip strength by 15 pounds and pinch strength by 4 pounds for increased abiilty to maintain grasp on objects.   Time 8   Period Weeks   Status On-going   OT LONG TERM GOAL #3   Title Patient will improve fine motor coordination by decreasing completion time on the Nine Hole Peg Test by 10 seconds for increased ability to manipulate zippers and buttons.   Time 8   Period Weeks   Status On-going   OT LONG TERM GOAL #4   Title Patient will improve sensation in bilateral hands to diminished protective sensation for increased safety while completing ADLs.   Time 8   Period Weeks   Status On-going               Plan - 07/29/14 1203    Clinical Impression Statement A:  patient required vg with 3 point pich to use pads of finger to pinch with vs entire digit.  patient has max difficulty with adducting left thumb and small digit.   Plan P:  Increase number of sponges patient is able to manipulate and grasp with his left hand as grip strength and [place] improve.         Problem List Patient Active Problem List   Diagnosis Date Noted  . Varicose veins of leg with complications 35/32/9924  . Rectal pain 10/10/2013  . Rectal bleeding 10/10/2013  . Unspecified constipation 10/10/2013  . Fecal incontinence 10/10/2013  . Heme positive stool 10/10/2013  . OSA on CPAP 08/25/2013  . HIV exposure 07/29/2013  . Encounter for screening colonoscopy 10/10/2012  . Hemorrhoids 10/10/2012    Jerome [name], OTR/L [phone]  07/29/2014, 12:06 PM  [place] [address] [place], [place], [address] Phone: [phone]   Fax:  [phone]

## 2014-07-31 ENCOUNTER — Ambulatory Visit (HOSPITAL_COMMUNITY)
Admission: RE | Admit: 2014-07-31 | Discharge: 2014-07-31 | Disposition: A | Discharge status: Home / Self Care | Payer: 59 | Source: Ambulatory Visit | Attending: Family Medicine | Admitting: Family Medicine

## 2014-07-31 ENCOUNTER — Encounter (HOSPITAL_COMMUNITY): Payer: Self-pay

## 2014-07-31 DIAGNOSIS — M6281 Muscle weakness (generalized): Secondary | ICD-10-CM

## 2014-07-31 DIAGNOSIS — M62541 Muscle wasting and atrophy, not elsewhere classified, right hand: Secondary | ICD-10-CM

## 2014-07-31 DIAGNOSIS — R279 Unspecified lack of coordination: Secondary | ICD-10-CM

## 2014-07-31 DIAGNOSIS — M62542 Muscle wasting and atrophy, not elsewhere classified, left hand: Secondary | ICD-10-CM | POA: Diagnosis not present

## 2014-07-31 NOTE — Therapy (Signed)
[place], [place], [address] Phone: [phone]   Fax:  [phone]  Occupational Therapy Treatment  Patient Details  Name: Jerome [name] MRN: [id number] Date of Birth: 1961/05/24 Referring Provider:  Elsie Lincoln, MD  Encounter Date: 07/31/2014      OT End of Session - 07/31/14 1129    Visit Number 3   Number of Visits 16   Date for OT Re-Evaluation 08/24/14   Authorization Type n/a   OT Start Time 0930   OT Stop Time 1015   OT Time Calculation (min) 45 min   Activity Tolerance Patient tolerated treatment well   Behavior During Therapy [facility] for tasks assessed/performed      Past Medical History  Diagnosis Date  . DJD (degenerative joint disease)   . Spinal stenosis   . Hypertension   . Obesity   . Thrombocytopenia     Past Surgical History  Procedure Laterality Date  . Laparoscopic gastric sleeve resection  06/2012    Dr Mickie Bail [facility])  . Breast reduction surgery    . Back surgery      x 4  . Colonoscopy N/A 10/14/2012    FXO:[id number] rectum and colon  . Flexible sigmoidoscopy N/A 10/15/2013    Procedure: FLEXIBLE SIGMOIDOSCOPY;  Surgeon: Daneil Dolin, MD;  [facility];  Service: Endoscopy;  Laterality: N/A;  8:30    There were no vitals taken for this visit.  Visit Diagnosis:  Hand muscle atrophy, left  Hand muscle atrophy, right  Lack of coordination  Hand muscle weakness      Subjective Assessment - 07/31/14 1126    Symptoms S: I've been practicing at home trying to get my thumb and pinkie to come together.    Currently in Pain? Yes   Pain Score 5    Pain Location Neck   Pain Orientation Posterior   Pain Descriptors / Indicators Constant;Aching   Pain Type Chronic pain          [facility] OT Assessment - 07/31/14 0953    Precautions   Precautions Fall               OT Treatments/Exercises (OP) - 07/31/14 0953    Bed Mobility   Bed Mobility --                   Hand Exercises   Digit Composite ABduction AROM;Both;5 reps   Digit Composite ADduction Both;AROM;5 reps   Digit Composite Adduction Limitations thumb and 5th digit max difficulty left hand; increased time needed to complete.    Neurological Re-education Exercises   Sponges R: 28 L: 17,14,16 low resistance.  difficulty keeping 5th digit flexed   Hand Gripper with Large Beads R: 55# 6 beads L: 11# 6 beads   Hand Gripper with Medium Beads R: 55# 13 med L: 11# 13 med   Hand Gripper with Small Beads R: 55# 7/16 beads L: 11# 16 beads                OT Education - 07/31/14 1129    Education provided Yes   Education Details Discussed safety awareness with decreased hand sensation.    Person(s) Educated Patient   Methods Explanation   Comprehension Verbalized understanding          OT Short Term Goals - 07/29/14 1205    OT SHORT TERM GOAL #1   Title Patient will be educated on a HEP.  Time 4   Period Weeks   Status On-going   OT SHORT TERM GOAL #2   Title Patient will improve bilateral grip strength by 10 pounds and pinch strength by 2 pounds for increased abiilty to maintain grasp on objects.   Time 4   Period Weeks   Status On-going   OT SHORT TERM GOAL #3   Title Patient will improve fine motor coordination by decreasing completion time on the Nine Hole Peg Test by 5 seconds for increased ability to manipulate zippers and buttons.   Time 4   Period Weeks   Status On-going   OT SHORT TERM GOAL #4   Title Patient will improve sensation in right hand to diminished protective sensation and left hand to loss of protective sensation.   Time 4   Period Weeks   Status On-going           OT Long Term Goals - 07/29/14 1206    OT LONG TERM GOAL #1   Title Patient will be able to fasten his own pants and hold his new grandbaby safely, as well as complete all BADLs with highest level of independence possible.   Time 8   Period Weeks   Status On-going    OT LONG TERM GOAL #2   Title Patient will improve bilateral grip strength by 15 pounds and pinch strength by 4 pounds for increased abiilty to maintain grasp on objects.   Time 8   Period Weeks   Status On-going   OT LONG TERM GOAL #3   Title Patient will improve fine motor coordination by decreasing completion time on the Nine Hole Peg Test by 10 seconds for increased ability to manipulate zippers and buttons.   Time 8   Period Weeks   Status On-going   OT LONG TERM GOAL #4   Title Patient will improve sensation in bilateral hands to diminished protective sensation for increased safety while completing ADLs.   Time 8   Period Weeks   Status On-going               Plan - 07/31/14 1130    Clinical Impression Statement A: Patient has max difficulty performing 5th digit flexion when thumb and pointer finger reach to pick up sponges.   Plan P: tweezer activity.        Problem List Patient Active Problem List   Diagnosis Date Noted  . Varicose veins of leg with complications 45/62/5638  . Rectal pain 10/10/2013  . Rectal bleeding 10/10/2013  . Unspecified constipation 10/10/2013  . Fecal incontinence 10/10/2013  . Heme positive stool 10/10/2013  . OSA on CPAP 08/25/2013  . HIV exposure 07/29/2013  . Encounter for screening colonoscopy 10/10/2012  . Hemorrhoids 10/10/2012     Ailene Ravel, OTR/L,CBIS  [phone] 07/31/2014, 11:32 AM  [place] [address] [place], [place], [address] Phone: [phone]   Fax:  [phone]

## 2014-08-03 ENCOUNTER — Encounter: Payer: Self-pay | Admitting: Vascular Surgery

## 2014-08-03 ENCOUNTER — Encounter (HOSPITAL_COMMUNITY): Payer: Self-pay

## 2014-08-03 ENCOUNTER — Ambulatory Visit (HOSPITAL_COMMUNITY)
Admission: RE | Admit: 2014-08-03 | Discharge: 2014-08-03 | Disposition: A | Discharge status: Home / Self Care | Payer: 59 | Source: Ambulatory Visit | Attending: Family Medicine | Admitting: Family Medicine

## 2014-08-03 DIAGNOSIS — R279 Unspecified lack of coordination: Secondary | ICD-10-CM

## 2014-08-03 DIAGNOSIS — M6281 Muscle weakness (generalized): Secondary | ICD-10-CM

## 2014-08-03 DIAGNOSIS — M62541 Muscle wasting and atrophy, not elsewhere classified, right hand: Secondary | ICD-10-CM

## 2014-08-03 DIAGNOSIS — M62542 Muscle wasting and atrophy, not elsewhere classified, left hand: Secondary | ICD-10-CM

## 2014-08-03 NOTE — Therapy (Signed)
[place], [place], [address] Phone: [phone]   Fax:  [phone]  Occupational Therapy Treatment  Patient Details  Name: Jerome [name] MRN: [id number] Date of Birth: 26-Apr-1961 Referring Provider:  Elsie Lincoln, MD  Encounter Date: 08/03/2014      OT End of Session - 08/03/14 0948    Visit Number 4   Number of Visits 16   Date for OT Re-Evaluation 08/24/14   Authorization Type n/a   OT Start Time 0851   OT Stop Time 0945   OT Time Calculation (min) 54 min   Activity Tolerance Patient tolerated treatment well   Behavior During Therapy [facility] for tasks assessed/performed      Past Medical History  Diagnosis Date  . DJD (degenerative joint disease)   . Spinal stenosis   . Hypertension   . Obesity   . Thrombocytopenia     Past Surgical History  Procedure Laterality Date  . Laparoscopic gastric sleeve resection  06/2012    Dr Mickie Bail [facility])  . Breast reduction surgery    . Back surgery      x 4  . Colonoscopy N/A 10/14/2012    ONG:[id number] rectum and colon  . Flexible sigmoidoscopy N/A 10/15/2013    Procedure: FLEXIBLE SIGMOIDOSCOPY;  Surgeon: Daneil Dolin, MD;  [facility];  Service: Endoscopy;  Laterality: N/A;  8:30    There were no vitals taken for this visit.  Visit Diagnosis:  Hand muscle atrophy, left  Hand muscle atrophy, right  Lack of coordination  Hand muscle weakness      Subjective Assessment - 08/03/14 0924    Symptoms S: I had a hard time sleeping last night. I was in a lot of pain.    Currently in Pain? Yes   Pain Score 8    Pain Location Scapula   Pain Orientation Right   Pain Descriptors / Indicators Aching   Pain Type Chronic pain   Multiple Pain Sites Yes   Pain Score 8   Pain Type Chronic pain   Pain Location Arm   Pain Orientation Left   Pain Descriptors / Indicators Aching   Pain Score 8   Pain Type Chronic pain   Pain Location Neck   Pain Orientation Posterior   Pain Descriptors / Indicators Aching;Constant          [facility] OT Assessment - 08/03/14 0906    Precautions   Precautions Fall               OT Treatments/Exercises (OP) - 08/03/14 1324    Hand Exercises   Digit Composite ABduction AROM;Both;5 reps   Digit Composite ADduction Both;AROM;5 reps   Digit Composite Adduction Limitations Min difficulty completing adduction with thumb and 5th digit. Increased time needed.    Theraputty - Grip yellow - each hand   Theraputty - Pinch yellow - pinching using pads of finger. each finger. each hand   Theraputty - Locate Pegs yellow - 6 beads - each hand   Small Pegboard Completed tweezer task using left hand to replicate colored peg pattern on pegboard.                   OT Short Term Goals - 07/29/14 1205    OT SHORT TERM GOAL #1   Title Patient will be educated on a HEP.   Time 4   Period Weeks   Status On-going   OT SHORT TERM GOAL #2  Title Patient will improve bilateral grip strength by 10 pounds and pinch strength by 2 pounds for increased abiilty to maintain grasp on objects.   Time 4   Period Weeks   Status On-going   OT SHORT TERM GOAL #3   Title Patient will improve fine motor coordination by decreasing completion time on the Nine Hole Peg Test by 5 seconds for increased ability to manipulate zippers and buttons.   Time 4   Period Weeks   Status On-going   OT SHORT TERM GOAL #4   Title Patient will improve sensation in right hand to diminished protective sensation and left hand to loss of protective sensation.   Time 4   Period Weeks   Status On-going           OT Long Term Goals - 07/29/14 1206    OT LONG TERM GOAL #1   Title Patient will be able to fasten his own pants and hold his new grandbaby safely, as well as complete all BADLs with highest level of independence possible.   Time 8   Period Weeks   Status On-going   OT LONG TERM GOAL #2   Title Patient will  improve bilateral grip strength by 15 pounds and pinch strength by 4 pounds for increased abiilty to maintain grasp on objects.   Time 8   Period Weeks   Status On-going   OT LONG TERM GOAL #3   Title Patient will improve fine motor coordination by decreasing completion time on the Nine Hole Peg Test by 10 seconds for increased ability to manipulate zippers and buttons.   Time 8   Period Weeks   Status On-going   OT LONG TERM GOAL #4   Title Patient will improve sensation in bilateral hands to diminished protective sensation for increased safety while completing ADLs.   Time 8   Period Weeks   Status On-going               Plan - 08/03/14 0948    Clinical Impression Statement A: Added tweezer activity to work on pinch strength. Pt required increased time to complete task. Motor coordination very slow but accurate. Pt able to complete finger adduction with less time needed. Thumb completed adduction without increased time.    Plan P: Continue to work on grip strengthening and motor control of thumb and 5th digit.         Problem List Patient Active Problem List   Diagnosis Date Noted  . Varicose veins of leg with complications 19/14/7829  . Rectal pain 10/10/2013  . Rectal bleeding 10/10/2013  . Unspecified constipation 10/10/2013  . Fecal incontinence 10/10/2013  . Heme positive stool 10/10/2013  . OSA on CPAP 08/25/2013  . HIV exposure 07/29/2013  . Encounter for screening colonoscopy 10/10/2012  . Hemorrhoids 10/10/2012    Ailene Ravel, OTR/L,CBIS  [phone]  08/03/2014, 9:51 AM  [place], [place], [address] Phone: [phone]   Fax:  [phone]

## 2014-08-04 ENCOUNTER — Encounter: Payer: Self-pay | Admitting: Vascular Surgery

## 2014-08-04 ENCOUNTER — Ambulatory Visit (INDEPENDENT_AMBULATORY_CARE_PROVIDER_SITE_OTHER): Payer: 59 | Admitting: Vascular Surgery

## 2014-08-04 VITALS — BP 130/82 | HR 74 | Resp 16 | Ht 72.5 in | Wt 240.0 lb

## 2014-08-04 DIAGNOSIS — I83892 Varicose veins of left lower extremities with other complications: Secondary | ICD-10-CM

## 2014-08-04 NOTE — Progress Notes (Signed)
Subjective:     Patient ID: Jerome [name], male   DOB: 18-Nov-1960, 54 y.o.   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 He has tried long-leg elastic compression stockings 20-30 millimeter gradient as well as elevation and ibuprofen and has had no improvement in his symptoms. He does have nerve compression symptoms from a back problem and in fact the stockings made his legs hurt significantly more while they were in place. He has no history of DVT. He continues to have aching throbbing and burning discomfort as well as chronic edema in the left leg.  Past Medical History  Diagnosis Date  . DJD (degenerative joint disease)   . Spinal stenosis   . Hypertension   . Obesity   . Thrombocytopenia     History  Substance Use Topics  . Smoking status: Former Smoker -- 0.50 packs/day    Types: Cigarettes    Quit date: 06/12/2012  . Smokeless tobacco: Never Used  . Alcohol Use: No    Family History  Problem Relation Age of Onset  . Colon cancer Paternal Grandmother   . Brain cancer Brother 59    Allergies  Allergen Reactions  . Aspirin Nausea And Vomiting    Due to gastric sleeve surgery   . Codeine Nausea And Vomiting and Nausea Only  . Nsaids Anxiety    Due to gastric sleeve surgery  N/V    Current outpatient prescriptions: acetaminophen (TYLENOL) 500 MG tablet, Take 1,000 mg by mouth every 6 (six) hours as needed., Disp: , Rfl: ;  calcium citrate-vitamin D (CITRACAL+D) 315-200 MG-UNIT per tablet, Take 1 tablet by mouth 2 (two) times daily., Disp: , Rfl: ;  cetirizine (ZYRTEC) 10 MG tablet, Take 10 mg by mouth as needed for allergies., Disp: , Rfl:  cholecalciferol (VITAMIN D) 400 UNITS TABS tablet, Take 400 Units by mouth daily., Disp: , Rfl: ;  clonazePAM (KLONOPIN) 1 MG tablet, Take 1 mg by mouth at bedtime as needed for anxiety (Sleep). , Disp: , Rfl: ;  cyclobenzaprine (FLEXERIL) 10 MG tablet,  Take 10 mg by mouth as needed for muscle spasms., Disp: , Rfl: ;  Flaxseed, Linseed, (FLAX SEED OIL PO), Take 1 tablet by mouth daily. , Disp: , Rfl:  losartan (COZAAR) 50 MG tablet, Take 50 mg by mouth daily. , Disp: , Rfl: ;  lubiprostone (AMITIZA) 24 MCG capsule, Take 1 capsule (24 mcg total) by mouth 2 (two) times daily with a meal., Disp: 60 capsule, Rfl: 5;  Multiple Vitamin (MULTIVITAMIN) capsule, Take 2 capsules by mouth daily., Disp: , Rfl: ;  nortriptyline (PAMELOR) 10 MG capsule, Take 10 mg by mouth at bedtime. , Disp: , Rfl: ;  nystatin-triamcinolone (MYCOLOG II) cream, , Disp: , Rfl:  omeprazole (PRILOSEC) 20 MG capsule, Take 20 mg by mouth daily. , Disp: , Rfl: ;  Oxycodone HCl 10 MG TABS, Every 4-6 hours., Disp: , Rfl: ;  pravastatin (PRAVACHOL) 40 MG tablet, Take 40 mg by mouth daily., Disp: , Rfl: ;  sertraline (ZOLOFT) 100 MG tablet, Take 100 mg by mouth daily. , Disp: , Rfl: ;  vitamin B-12 (CYANOCOBALAMIN) 1000 MCG tablet, Take 1,000 mcg by mouth daily., Disp: , Rfl:  zinc gluconate 50 MG tablet, Take 50 mg by mouth daily., Disp: , Rfl: ;  Lidocaine-Hydrocortisone Ace (ANA-LEX) 2-2 % KIT, Place 1 applicator rectally 2 (two) times daily. (Patient not taking: Reported on 08/04/2014), Disp: 60 each,  Rfl: 0 Nitroglycerin (RECTIV) 0.4 % OINT, Apply one inch line of medication anorectally twice per day for three weeks. Use glove to avoid exposure of medication to hand. (Patient not taking: Reported on 08/04/2014), Disp: 30 g, Rfl: 0;  STENDRA 200 MG TABS, , Disp: , Rfl:   BP 130/82 mmHg  Pulse 74  Resp 16  Ht 6' 0.5" (1.842 m)  Wt 240 lb (108.863 kg)  BMI 32.08 kg/m2  Body mass index is 32.08 kg/(m^2).         Review of Systems denies chest pain, dyspnea on exertion, PND, orthopnea, hemoptysis. Does have degenerative joint disease with chronic back pain.     Objective:   Physical Exam BP 130/82 mmHg  Pulse 74  Resp 16  Ht 6' 0.5" (1.842 m)  Wt 240 lb (108.863 kg)  BMI 32.08  kg/m2  General obese male no apparent distress alert and oriented 3 Lungs no rhonchi or wheezing Left leg with bulging varicosities beginning in the medial distal thigh and medial calf posterior calf and lateral calf. 1+ edema with no active ulceration. 2+ dorsalis pedis pulse palpable.  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 Symptoms are resistant to conservative measures and affecting patient's daily living    Plan:     Patient needs laser ablation left great saphenous vein from distal thigh to near saphenofemoral junction plus greater than 20 stab phlebectomy of painful varicosities to relieve his symptoms Will proceed with precertification to perform this in the near future.

## 2014-08-06 ENCOUNTER — Encounter (HOSPITAL_COMMUNITY): Payer: Self-pay

## 2014-08-06 ENCOUNTER — Ambulatory Visit (HOSPITAL_COMMUNITY)
Admission: RE | Admit: 2014-08-06 | Discharge: 2014-08-06 | Disposition: A | Discharge status: Home / Self Care | Payer: 59 | Source: Ambulatory Visit | Attending: Family Medicine | Admitting: Family Medicine

## 2014-08-06 DIAGNOSIS — M62541 Muscle wasting and atrophy, not elsewhere classified, right hand: Secondary | ICD-10-CM

## 2014-08-06 DIAGNOSIS — M6281 Muscle weakness (generalized): Secondary | ICD-10-CM

## 2014-08-06 DIAGNOSIS — M62542 Muscle wasting and atrophy, not elsewhere classified, left hand: Secondary | ICD-10-CM | POA: Diagnosis not present

## 2014-08-06 DIAGNOSIS — R279 Unspecified lack of coordination: Secondary | ICD-10-CM

## 2014-08-06 NOTE — Therapy (Signed)
[place], [place], [address] Phone: [phone]   Fax:  [phone]  Occupational Therapy Treatment  Patient Details  Name: Jerome [name] MRN: [id number] Date of Birth: 1960/10/20 Referring Provider:  Sharilyn Sites, MD  Encounter Date: 08/06/2014      OT End of Session - 08/06/14 1000    Visit Number 5   Number of Visits 16   Date for OT Re-Evaluation 08/24/14   Authorization Type n/a   OT Start Time 0847   OT Stop Time 0930   OT Time Calculation (min) 43 min   Activity Tolerance Patient tolerated treatment well   Behavior During Therapy [facility] for tasks assessed/performed      Past Medical History  Diagnosis Date  . DJD (degenerative joint disease)   . Spinal stenosis   . Hypertension   . Obesity   . Thrombocytopenia     Past Surgical History  Procedure Laterality Date  . Laparoscopic gastric sleeve resection  06/2012    Dr Mickie Bail [facility])  . Breast reduction surgery    . Back surgery      x 4  . Colonoscopy N/A 10/14/2012    PHX:[id number] rectum and colon  . Flexible sigmoidoscopy N/A 10/15/2013    Procedure: FLEXIBLE SIGMOIDOSCOPY;  Surgeon: Daneil Dolin, MD;  [facility];  Service: Endoscopy;  Laterality: N/A;  8:30    There were no vitals taken for this visit.  Visit Diagnosis:  Hand muscle atrophy, left  Hand muscle atrophy, right  Lack of coordination  Hand muscle weakness      Subjective Assessment - 08/06/14 0857    Symptoms S: My hands have felt more numb this week.   Currently in Pain? Yes   Pain Score 6    Pain Location Neck   Pain Orientation Posterior   Pain Descriptors / Indicators Aching   Pain Type Chronic pain          [facility] OT Assessment - 08/06/14 0857    Precautions   Precautions Fall               OT Treatments/Exercises (OP) - 08/06/14 0857    Hand Exercises   Digit Composite ABduction AROM;Both;5 reps   Digit Composite ADduction  Both;AROM;5 reps   Digit Composite Adduction Limitations Min difficulty completing adduction with thumb and 5th digit. Increased time needed.    Other Hand Exercises [facility] activities using deck of cards focusing on shuffling, flipping one card over at a time, rotating card 5X clockwise 5X counter clockwise, thumb dealing   Other Hand Exercises Resistive clothespins all colors placed and removed with L hand. placed using lateral pinch. Removed with 3 point pinch.    Neurological Re-education Exercises   Hand Gripper with Large Beads R: 55# 6 beads L: 11# 6 beads   Hand Gripper with Medium Beads R: 55# 13 med L: 11# 13 med   Hand Gripper with Small Beads R: 55# 16 beads L: 11# 16 beads                  OT Short Term Goals - 07/29/14 1205    OT SHORT TERM GOAL #1   Title Patient will be educated on a HEP.   Time 4   Period Weeks   Status On-going   OT SHORT TERM GOAL #2   Title Patient will improve bilateral grip strength by 10 pounds and pinch strength by 2 pounds for increased  abiilty to maintain grasp on objects.   Time 4   Period Weeks   Status On-going   OT SHORT TERM GOAL #3   Title Patient will improve fine motor coordination by decreasing completion time on the Nine Hole Peg Test by 5 seconds for increased ability to manipulate zippers and buttons.   Time 4   Period Weeks   Status On-going   OT SHORT TERM GOAL #4   Title Patient will improve sensation in right hand to diminished protective sensation and left hand to loss of protective sensation.   Time 4   Period Weeks   Status On-going           OT Long Term Goals - 07/29/14 1206    OT LONG TERM GOAL #1   Title Patient will be able to fasten his own pants and hold his new grandbaby safely, as well as complete all BADLs with highest level of independence possible.   Time 8   Period Weeks   Status On-going   OT LONG TERM GOAL #2   Title Patient will improve bilateral grip strength by 15 pounds and pinch  strength by 4 pounds for increased abiilty to maintain grasp on objects.   Time 8   Period Weeks   Status On-going   OT LONG TERM GOAL #3   Title Patient will improve fine motor coordination by decreasing completion time on the Nine Hole Peg Test by 10 seconds for increased ability to manipulate zippers and buttons.   Time 8   Period Weeks   Status On-going   OT LONG TERM GOAL #4   Title Patient will improve sensation in bilateral hands to diminished protective sensation for increased safety while completing ADLs.   Time 8   Period Weeks   Status On-going               Plan - 08/06/14 1000    Clinical Impression Statement A: Added [facility] coordination activities with a deck of cards. Paient tolerated well. Continues tohave deficits with coordination L>R hand.    Plan P: nuts and bolts. perdue pegboard        Problem List Patient Active Problem List   Diagnosis Date Noted  . Varicose veins of leg with complications 57/07/7791  . Rectal pain 10/10/2013  . Rectal bleeding 10/10/2013  . Unspecified constipation 10/10/2013  . Fecal incontinence 10/10/2013  . Heme positive stool 10/10/2013  . OSA on CPAP 08/25/2013  . HIV exposure 07/29/2013  . Encounter for screening colonoscopy 10/10/2012  . Hemorrhoids 10/10/2012    Ailene Ravel, OTR/L,CBIS  [phone]  08/06/2014, 10:03 AM  [place] [address] [place], [place], [address] Phone: [phone]   Fax:  [phone]

## 2014-08-10 ENCOUNTER — Ambulatory Visit (HOSPITAL_COMMUNITY)
Admission: RE | Admit: 2014-08-10 | Discharge: 2014-08-10 | Disposition: A | Discharge status: Home / Self Care | Payer: 59 | Source: Ambulatory Visit | Attending: Physician Assistant | Admitting: Physician Assistant

## 2014-08-10 ENCOUNTER — Encounter (HOSPITAL_COMMUNITY): Payer: Self-pay

## 2014-08-10 DIAGNOSIS — M6281 Muscle weakness (generalized): Secondary | ICD-10-CM

## 2014-08-10 DIAGNOSIS — M62542 Muscle wasting and atrophy, not elsewhere classified, left hand: Secondary | ICD-10-CM | POA: Diagnosis not present

## 2014-08-10 DIAGNOSIS — M62541 Muscle wasting and atrophy, not elsewhere classified, right hand: Secondary | ICD-10-CM

## 2014-08-10 DIAGNOSIS — R279 Unspecified lack of coordination: Secondary | ICD-10-CM

## 2014-08-10 NOTE — Therapy (Signed)
[place], [place], [address] Phone: [phone]   Fax:  [phone]  Occupational Therapy Treatment  Patient Details  Name: Jerome [name] MRN: [id number] Date of Birth: 1961-03-02 Referring Provider:  Collene Mares, PA-C  Encounter Date: 08/10/2014      OT End of Session - 08/10/14 1458    Visit Number 6   Number of Visits 16   Date for OT Re-Evaluation 08/24/14   Authorization Type n/a   OT Start Time 1351   OT Stop Time 1445   OT Time Calculation (min) 54 min   Activity Tolerance Patient tolerated treatment well   Behavior During Therapy [facility] for tasks assessed/performed      Past Medical History  Diagnosis Date  . DJD (degenerative joint disease)   . Spinal stenosis   . Hypertension   . Obesity   . Thrombocytopenia     Past Surgical History  Procedure Laterality Date  . Laparoscopic gastric sleeve resection  06/2012    Dr Mickie Bail [facility])  . Breast reduction surgery    . Back surgery      x 4  . Colonoscopy N/A 10/14/2012    [id number] rectum and colon  . Flexible sigmoidoscopy N/A 10/15/2013    Procedure: FLEXIBLE SIGMOIDOSCOPY;  Surgeon: Daneil Dolin, MD;  [facility];  Service: Endoscopy;  Laterality: N/A;  8:30    There were no vitals taken for this visit.  Visit Diagnosis:  Hand muscle atrophy, left  Hand muscle atrophy, right  Lack of coordination  Hand muscle weakness      Subjective Assessment - 08/10/14 1433    Symptoms S: I have a wet sensation on my hand. Have you ever heard anyone say that before?   Currently in Pain? Yes   Pain Score 6    Pain Location Neck   Pain Orientation Posterior   Pain Descriptors / Indicators Aching   Pain Type Chronic pain          [facility] OT Assessment - 08/10/14 1434    Precautions   Precautions Fall               OT Treatments/Exercises (OP) - 08/10/14 1435    Hand Exercises   Other Hand Exercises Purdue  Pegboard completing each peg with each individual digit of each hand. Increased time needed to complete with left small and ring finger.    Other Hand Exercises Nuts and bolts board completed with left hand only. Each finger removed and placed each nut from bolt. large to medium size nuts used. Increased time to complete.                   OT Short Term Goals - 07/29/14 1205    OT SHORT TERM GOAL #1   Title Patient will be educated on a HEP.   Time 4   Period Weeks   Status On-going   OT SHORT TERM GOAL #2   Title Patient will improve bilateral grip strength by 10 pounds and pinch strength by 2 pounds for increased abiilty to maintain grasp on objects.   Time 4   Period Weeks   Status On-going   OT SHORT TERM GOAL #3   Title Patient will improve fine motor coordination by decreasing completion time on the Nine Hole Peg Test by 5 seconds for increased ability to manipulate zippers and buttons.   Time 4   Period Weeks   Status On-going  OT SHORT TERM GOAL #4   Title Patient will improve sensation in right hand to diminished protective sensation and left hand to loss of protective sensation.   Time 4   Period Weeks   Status On-going           OT Long Term Goals - 07/29/14 1206    OT LONG TERM GOAL #1   Title Patient will be able to fasten his own pants and hold his new grandbaby safely, as well as complete all BADLs with highest level of independence possible.   Time 8   Period Weeks   Status On-going   OT LONG TERM GOAL #2   Title Patient will improve bilateral grip strength by 15 pounds and pinch strength by 4 pounds for increased abiilty to maintain grasp on objects.   Time 8   Period Weeks   Status On-going   OT LONG TERM GOAL #3   Title Patient will improve fine motor coordination by decreasing completion time on the Nine Hole Peg Test by 10 seconds for increased ability to manipulate zippers and buttons.   Time 8   Period Weeks   Status On-going   OT LONG  TERM GOAL #4   Title Patient will improve sensation in bilateral hands to diminished protective sensation for increased safety while completing ADLs.   Time 8   Period Weeks   Status On-going               Plan - 08/10/14 1458    Clinical Impression Statement A: Focused on fine motor coordination using purdue pegboard and nuts and bolts. Pt was able to complete with report of pain/fatigue in between small finger and thumb of left hand. Pt also reports a sensation of wetness when he touches certain items.    Plan P: Take measurements and give to patient to take to his pain doctor. MD appt on Wednesday. Doctor is Dwain Sarna, OD. Continue to work on hand strengthening and fine motor coordination tasks.         Problem List Patient Active Problem List   Diagnosis Date Noted  . Varicose veins of leg with complications 33/35/4562  . Rectal pain 10/10/2013  . Rectal bleeding 10/10/2013  . Unspecified constipation 10/10/2013  . Fecal incontinence 10/10/2013  . Heme positive stool 10/10/2013  . OSA on CPAP 08/25/2013  . HIV exposure 07/29/2013  . Encounter for screening colonoscopy 10/10/2012  . Hemorrhoids 10/10/2012    Ailene Ravel, OTR/L,CBIS  [phone]  08/10/2014, 3:01 PM  [place] [address] [place], [place], [address] Phone: [phone]   Fax:  [phone]

## 2014-08-11 ENCOUNTER — Encounter (HOSPITAL_COMMUNITY): Payer: Self-pay | Admitting: Specialist

## 2014-08-11 ENCOUNTER — Ambulatory Visit (HOSPITAL_COMMUNITY)
Admission: RE | Admit: 2014-08-11 | Discharge: 2014-08-11 | Disposition: A | Discharge status: Home / Self Care | Payer: 59 | Source: Ambulatory Visit | Attending: Physician Assistant | Admitting: Physician Assistant

## 2014-08-11 DIAGNOSIS — M62542 Muscle wasting and atrophy, not elsewhere classified, left hand: Secondary | ICD-10-CM

## 2014-08-11 DIAGNOSIS — M6281 Muscle weakness (generalized): Secondary | ICD-10-CM

## 2014-08-11 DIAGNOSIS — M62541 Muscle wasting and atrophy, not elsewhere classified, right hand: Secondary | ICD-10-CM

## 2014-08-11 DIAGNOSIS — R279 Unspecified lack of coordination: Secondary | ICD-10-CM

## 2014-08-11 NOTE — Therapy (Signed)
[place], [place], [address] Phone: [phone]   Fax:  [phone]  Occupational Therapy Treatment  Patient Details  Name: Jerome [name] MRN: [id number] Date of Birth: 11-Aug-1960 Referring Provider:  Collene Mares, PA-C  Encounter Date: 08/11/2014      OT End of Session - 08/11/14 1028    Visit Number 7   Number of Visits 16   Date for OT Re-Evaluation 08/24/14   OT Start Time 0941   OT Stop Time 1030   OT Time Calculation (min) 49 min   Activity Tolerance Patient tolerated treatment well   Behavior During Therapy [facility] for tasks assessed/performed      Past Medical History  Diagnosis Date  . DJD (degenerative joint disease)   . Spinal stenosis   . Hypertension   . Obesity   . Thrombocytopenia     Past Surgical History  Procedure Laterality Date  . Laparoscopic gastric sleeve resection  06/2012    Dr Mickie Bail [facility])  . Breast reduction surgery    . Back surgery      x 4  . Colonoscopy N/A 10/14/2012    [id number] rectum and colon  . Flexible sigmoidoscopy N/A 10/15/2013    Procedure: FLEXIBLE SIGMOIDOSCOPY;  Surgeon: Daneil Dolin, MD;  [facility];  Service: Endoscopy;  Laterality: N/A;  8:30    There were no vitals taken for this visit.  Visit Diagnosis:  Hand muscle atrophy, left  Hand muscle atrophy, right  Lack of coordination  Hand muscle weakness      Subjective Assessment - 08/11/14 1000    Symptoms S:  Im going to the pain doctor tomorrow.  I dont need current measurements, just the plan of care   Currently in Pain? Yes   Pain Score 7    Pain Location Neck   Pain Orientation Posterior   Pain Descriptors / Indicators Aching   Pain Type Chronic pain   Pain Onset More than a month ago          [facility] OT Assessment - 08/10/14 1434    Precautions   Precautions Fall               OT Treatments/Exercises (OP) - 08/11/14 0943    Exercises   Exercises  Hand   Hand Exercises   Digit Composite ABduction AROM;Both;5 reps   Digit Composite ADduction Both;AROM;5 reps   Digit Composite Adduction Limitations thumb adduction with OT blocking IPJ from flexing in order for thumb adductor to do work vs IP flexor max blocking, able to adduct with min pa X 2 then max difficulty X 3 then min difficulty  X 4   Theraputty - Flatten yellow in seated position using bilateral hands   Theraputty - Roll yellow using bilateral hands   Theraputty - Grip yellow - each hand supinated and pronated  foream   Theraputty - Pinch lateral pinch and 3 point pinch   Theraputty - Locate Pegs yellow - 6 beads - each hand then picked up pegs with alternating digits and placed back in basket   Purdue Pegboard used tweezers to fucus on tripod grasp/pinch to place 15 pegs into pegboard and then remove.   verbal guidance to use tripod grasp vs just rf and thumb   Fine Motor Coordination   Tendon Glides 5 times bilateral hands, increased fluidness from first attempt  OT Short Term Goals - 07/29/14 1205    OT SHORT TERM GOAL #1   Title Patient will be educated on a HEP.   Time 4   Period Weeks   Status On-going   OT SHORT TERM GOAL #2   Title Patient will improve bilateral grip strength by 10 pounds and pinch strength by 2 pounds for increased abiilty to maintain grasp on objects.   Time 4   Period Weeks   Status On-going   OT SHORT TERM GOAL #3   Title Patient will improve fine motor coordination by decreasing completion time on the Nine Hole Peg Test by 5 seconds for increased ability to manipulate zippers and buttons.   Time 4   Period Weeks   Status On-going   OT SHORT TERM GOAL #4   Title Patient will improve sensation in right hand to diminished protective sensation and left hand to loss of protective sensation.   Time 4   Period Weeks   Status On-going           OT Long Term Goals - 07/29/14 1206    OT LONG TERM GOAL #1   Title  Patient will be able to fasten his own pants and hold his new grandbaby safely, as well as complete all BADLs with highest level of independence possible.   Time 8   Period Weeks   Status On-going   OT LONG TERM GOAL #2   Title Patient will improve bilateral grip strength by 15 pounds and pinch strength by 4 pounds for increased abiilty to maintain grasp on objects.   Time 8   Period Weeks   Status On-going   OT LONG TERM GOAL #3   Title Patient will improve fine motor coordination by decreasing completion time on the Nine Hole Peg Test by 10 seconds for increased ability to manipulate zippers and buttons.   Time 8   Period Weeks   Status On-going   OT LONG TERM GOAL #4   Title Patient will improve sensation in bilateral hands to diminished protective sensation for increased safety while completing ADLs.   Time 8   Period Weeks   Status On-going               Plan - 08/11/14 1031    Clinical Impression Statement A:  patient has improved fluidity of movement with tendon glides.  able to use thumb to oppose to small finger to pick up beads this date, was unable to do this at initial evaluation   Plan P:  focus on grip strengthening and tripod grasp on household utensils.         Problem List Patient Active Problem List   Diagnosis Date Noted  . Varicose veins of leg with complications 56/97/9480  . Rectal pain 10/10/2013  . Rectal bleeding 10/10/2013  . Unspecified constipation 10/10/2013  . Fecal incontinence 10/10/2013  . Heme positive stool 10/10/2013  . OSA on CPAP 08/25/2013  . HIV exposure 07/29/2013  . Encounter for screening colonoscopy 10/10/2012  . Hemorrhoids 10/10/2012    Vangie Bicker, OTR/L [phone]  08/11/2014, 10:35 AM  [place], [place], [address] Phone: [phone]   Fax:  [phone]

## 2014-08-13 ENCOUNTER — Encounter (HOSPITAL_COMMUNITY): Payer: 59

## 2014-08-17 ENCOUNTER — Ambulatory Visit (HOSPITAL_COMMUNITY)
Admission: RE | Admit: 2014-08-17 | Discharge: 2014-08-17 | Disposition: A | Discharge status: Home / Self Care | Payer: 59 | Source: Ambulatory Visit | Attending: Physician Assistant | Admitting: Physician Assistant

## 2014-08-17 ENCOUNTER — Encounter (HOSPITAL_COMMUNITY): Payer: Self-pay

## 2014-08-17 ENCOUNTER — Other Ambulatory Visit: Payer: Self-pay | Admitting: *Deleted

## 2014-08-17 DIAGNOSIS — R279 Unspecified lack of coordination: Secondary | ICD-10-CM

## 2014-08-17 DIAGNOSIS — M62542 Muscle wasting and atrophy, not elsewhere classified, left hand: Secondary | ICD-10-CM | POA: Diagnosis not present

## 2014-08-17 DIAGNOSIS — M62541 Muscle wasting and atrophy, not elsewhere classified, right hand: Secondary | ICD-10-CM

## 2014-08-17 DIAGNOSIS — M6281 Muscle weakness (generalized): Secondary | ICD-10-CM

## 2014-08-17 DIAGNOSIS — I83892 Varicose veins of left lower extremities with other complications: Secondary | ICD-10-CM

## 2014-08-17 NOTE — Therapy (Signed)
[place], [place], [address] Phone: [phone]   Fax:  [phone]  Occupational Therapy Treatment  Patient Details  Name: Jerome [name] MRN: [id number] Date of Birth: [birth year] Referring Provider:  Collene Mares, PA-C  Encounter Date: 08/17/2014      OT End of Session - 08/17/14 1142    Visit Number 8   Number of Visits 16   Date for OT Re-Evaluation 08/24/14   Authorization Type n/a   OT Start Time 0933   OT Stop Time 1020   OT Time Calculation (min) 47 min   Activity Tolerance Patient tolerated treatment well   Behavior During Therapy [facility] for tasks assessed/performed      Past Medical History  Diagnosis Date  . DJD (degenerative joint disease)   . Spinal stenosis   . Hypertension   . Obesity   . Thrombocytopenia     Past Surgical History  Procedure Laterality Date  . Laparoscopic gastric sleeve resection  06/2012    Dr Mickie Bail [facility])  . Breast reduction surgery    . Back surgery      x 4  . Colonoscopy N/A 10/14/2012    [id number] rectum and colon  . Flexible sigmoidoscopy N/A 10/15/2013    Procedure: FLEXIBLE SIGMOIDOSCOPY;  Surgeon: Daneil Dolin, MD;  [facility];  Service: Endoscopy;  Laterality: N/A;  8:30    There were no vitals taken for this visit.  Visit Diagnosis:  Hand muscle atrophy, left  Hand muscle atrophy, right  Lack of coordination  Hand muscle weakness      Subjective Assessment - 08/17/14 0935    Symptoms S: I have to get blood work done first before I get the epidural shot in my shoulder.   Currently in Pain? Yes   Pain Score 7    Pain Location Neck   Pain Orientation Posterior   Pain Descriptors / Indicators Aching   Pain Type Chronic pain          [facility] OT Assessment - 08/17/14 0935    Precautions   Precautions Fall               OT Treatments/Exercises (OP) - 08/17/14 8242    Hand Exercises   Digit Composite ABduction  AROM;Both;5 reps   Digit Composite ADduction Both;AROM;5 reps   Fine Motor Coordination   Tendon Glides 5X bilateral   Neurological Re-education Exercises   Sponges L: 10,11 low resistance   Hand Gripper with Large Beads R: 61# 6 beads L: 18# 6 beads   Hand Gripper with Medium Beads R: 61# 13 beads L: 18# 13 beads   Hand Gripper with Small Beads R: 61# 4/12 beads L: 18# 7/12 beads   Other Grasp and Release Exercises  green eggsercizer 5 pinches with each finger;orange finger adduction bilateral hands.                   OT Short Term Goals - 07/29/14 1205    OT SHORT TERM GOAL #1   Title Patient will be educated on a HEP.   Time 4   Period Weeks   Status On-going   OT SHORT TERM GOAL #2   Title Patient will improve bilateral grip strength by 10 pounds and pinch strength by 2 pounds for increased abiilty to maintain grasp on objects.   Time 4   Period Weeks   Status On-going   OT SHORT TERM GOAL #3  Title Patient will improve fine motor coordination by decreasing completion time on the Nine Hole Peg Test by 5 seconds for increased ability to manipulate zippers and buttons.   Time 4   Period Weeks   Status On-going   OT SHORT TERM GOAL #4   Title Patient will improve sensation in right hand to diminished protective sensation and left hand to loss of protective sensation.   Time 4   Period Weeks   Status On-going           OT Long Term Goals - 07/29/14 1206    OT LONG TERM GOAL #1   Title Patient will be able to fasten his own pants and hold his new grandbaby safely, as well as complete all BADLs with highest level of independence possible.   Time 8   Period Weeks   Status On-going   OT LONG TERM GOAL #2   Title Patient will improve bilateral grip strength by 15 pounds and pinch strength by 4 pounds for increased abiilty to maintain grasp on objects.   Time 8   Period Weeks   Status On-going   OT LONG TERM GOAL #3   Title Patient will improve fine motor  coordination by decreasing completion time on the Nine Hole Peg Test by 10 seconds for increased ability to manipulate zippers and buttons.   Time 8   Period Weeks   Status On-going   OT LONG TERM GOAL #4   Title Patient will improve sensation in bilateral hands to diminished protective sensation for increased safety while completing ADLs.   Time 8   Period Weeks   Status On-going               Plan - 08/17/14 1142    Clinical Impression Statement A: Continued to work on grip strengthening to increase hold on household utensils. Pt reports that this weekend was not very good for his grip strength.    Plan P: Reassess        Problem List Patient Active Problem List   Diagnosis Date Noted  . Varicose veins of leg with complications 85/08/7739  . Rectal pain 10/10/2013  . Rectal bleeding 10/10/2013  . Unspecified constipation 10/10/2013  . Fecal incontinence 10/10/2013  . Heme positive stool 10/10/2013  . OSA on CPAP 08/25/2013  . HIV exposure 07/29/2013  . Encounter for screening colonoscopy 10/10/2012  . Hemorrhoids 10/10/2012    Ailene Ravel, OTR/L,CBIS  [phone]  08/17/2014, 11:44 AM  [place], [place], [address] Phone: [phone]   Fax:  [phone]

## 2014-08-19 ENCOUNTER — Encounter (HOSPITAL_COMMUNITY): Payer: 59 | Admitting: Specialist

## 2014-08-25 ENCOUNTER — Ambulatory Visit (HOSPITAL_COMMUNITY)
Admission: RE | Admit: 2014-08-25 | Discharge: 2014-08-25 | Disposition: A | Discharge status: Home / Self Care | Payer: 59 | Source: Ambulatory Visit | Attending: Family Medicine | Admitting: Family Medicine

## 2014-08-25 ENCOUNTER — Encounter (HOSPITAL_COMMUNITY): Payer: Self-pay

## 2014-08-25 DIAGNOSIS — M62542 Muscle wasting and atrophy, not elsewhere classified, left hand: Secondary | ICD-10-CM | POA: Diagnosis present

## 2014-08-25 DIAGNOSIS — R278 Other lack of coordination: Secondary | ICD-10-CM | POA: Diagnosis not present

## 2014-08-25 DIAGNOSIS — M6281 Muscle weakness (generalized): Secondary | ICD-10-CM

## 2014-08-25 DIAGNOSIS — R279 Unspecified lack of coordination: Secondary | ICD-10-CM

## 2014-08-25 DIAGNOSIS — R200 Anesthesia of skin: Secondary | ICD-10-CM | POA: Insufficient documentation

## 2014-08-25 DIAGNOSIS — M62541 Muscle wasting and atrophy, not elsewhere classified, right hand: Secondary | ICD-10-CM

## 2014-08-25 NOTE — Therapy (Signed)
[place], [place], [address] Phone: [phone]   Fax:  [phone]  Occupational Therapy Reassessment   Patient Details  Name: Jerome [name] MRN: [id number] Date of Birth: September 12, 1960 Referring Provider:  Sharilyn Sites, MD  Encounter Date: 08/25/2014      OT End of Session - 08/25/14 1709    Visit Number 9   Number of Visits 16   Date for OT Re-Evaluation 09/22/14   Authorization Type n/a   OT Start Time 1520   OT Stop Time 1600   OT Time Calculation (min) 40 min   Activity Tolerance Patient tolerated treatment well   Behavior During Therapy [facility] for tasks assessed/performed      Past Medical History  Diagnosis Date  . DJD (degenerative joint disease)   . Spinal stenosis   . Hypertension   . Obesity   . Thrombocytopenia     Past Surgical History  Procedure Laterality Date  . Laparoscopic gastric sleeve resection  06/2012    Dr Mickie Bail [facility])  . Breast reduction surgery    . Back surgery      x 4  . Colonoscopy N/A 10/14/2012    [id number] rectum and colon  . Flexible sigmoidoscopy N/A 10/15/2013    Procedure: FLEXIBLE SIGMOIDOSCOPY;  Surgeon: Daneil Dolin, MD;  [facility];  Service: Endoscopy;  Laterality: N/A;  8:30    There were no vitals taken for this visit.  Visit Diagnosis:  Hand muscle atrophy, left  Hand muscle atrophy, right  Lack of coordination  Hand muscle weakness      Subjective Assessment - 08/25/14 1526    Symptoms S:I've had a headache for a week. And my blood platelets are too low to get a shot for the pain.    Currently in Pain? Yes   Pain Score 8    Pain Location Head   Pain Orientation Posterior   Pain Descriptors / Indicators Aching          [facility] OT Assessment - 08/25/14 1527    Assessment   Diagnosis Bilateral Hand Atrophy   Precautions   Precautions Fall   Sensation   Additional Comments Thornell Mule: Left: 4,56 except volar and  dorsal side of 5th and 4th digit. Right: 4.31 on palm of hand only.    Coordination   Right 9 Hole Peg Test 22.3"  on eval: 36.54"   Left 9 Hole Peg Test 22.3"  on eval: 24.2"   Strength   Grip (lbs) 70  on eval: 60   Right Hand Lateral Pinch 10 lbs  on eval: 12   Right Hand 3 Point Pinch 8 lbs  on eval: 8   Grip (lbs) 25  on eval: 15   Left Hand Lateral Pinch 4 lbs  on eval: 2   Left Hand 3 Point Pinch 7 lbs  on eval: 6                         OT Short Term Goals - 08/25/14 1537    OT SHORT TERM GOAL #1   Title Patient will be educated on a HEP.   Time --   Period --   Status Achieved   OT SHORT TERM GOAL #2   Title Patient will improve bilateral grip strength by 10 pounds and pinch strength by 2 pounds for increased abiilty to maintain grasp on objects.   Time --  Period --   Status Partially Met   OT SHORT TERM GOAL #3   Title Patient will improve fine motor coordination by decreasing completion time on the Nine Hole Peg Test by 5 seconds for increased ability to manipulate zippers and buttons.   Time --   Period --   Status Partially Met   OT SHORT TERM GOAL #4   Title Patient will improve sensation in right hand to diminished protective sensation and left hand to loss of protective sensation.   Time --   Period --   Status Partially Met           OT Long Term Goals - 08/25/14 1549    OT LONG TERM GOAL #1   Title Patient will be able to fasten his own pants and hold his new grandbaby safely, as well as complete all BADLs with highest level of independence possible.   Time --   Period --   Status On-going   OT LONG TERM GOAL #2   Title Patient will improve bilateral grip strength by 15 pounds and pinch strength by 4 pounds for increased abiilty to maintain grasp on objects.   Time --   Period --   Status On-going   OT LONG TERM GOAL #3   Title Patient will improve fine motor coordination by decreasing completion time on the Nine Hole  Peg Test by 10 seconds for increased ability to manipulate zippers and buttons.   Time --   Period --   Status On-going   OT LONG TERM GOAL #4   Title Patient will improve sensation in bilateral hands to diminished protective sensation for increased safety while completing ADLs.   Time --   Period --   Status Deferred               Plan - 08/25/14 1710    Clinical Impression Statement A: Reassessment completed this date. patient met 1 short term goal with 3 others partly met. No long term goals met at this time. Patient stating that he's had a head ache for 6 days now and he is unable to get a shot for pain as his platelet count has been too low. Today patient reports increased difficulty with his right hand as at rest there are slight tremors. Pt reports that he is still having difficulty with all daily tasks. Pt states he notices his  left thumb is moving much better and he's getting more control of his left hand.    Plan P: Cont to work on coordination, pinch and grip strengthening.         Problem List Patient Active Problem List   Diagnosis Date Noted  . Varicose veins of leg with complications 29/19/1660  . Rectal pain 10/10/2013  . Rectal bleeding 10/10/2013  . Unspecified constipation 10/10/2013  . Fecal incontinence 10/10/2013  . Heme positive stool 10/10/2013  . OSA on CPAP 08/25/2013  . HIV exposure 07/29/2013  . Encounter for screening colonoscopy 10/10/2012  . Hemorrhoids 10/10/2012    Ailene Ravel, OTR/L,CBIS  [phone]  08/25/2014, 5:19 PM  [place] [address] [place], [place], [address] Phone: [phone]   Fax:  [phone]

## 2014-08-27 ENCOUNTER — Encounter (HOSPITAL_COMMUNITY): Payer: Self-pay

## 2014-08-27 ENCOUNTER — Ambulatory Visit (HOSPITAL_COMMUNITY)
Admission: RE | Admit: 2014-08-27 | Discharge: 2014-08-27 | Disposition: A | Discharge status: Home / Self Care | Payer: 59 | Source: Ambulatory Visit | Attending: Physician Assistant | Admitting: Physician Assistant

## 2014-08-27 DIAGNOSIS — M62542 Muscle wasting and atrophy, not elsewhere classified, left hand: Secondary | ICD-10-CM

## 2014-08-27 DIAGNOSIS — R279 Unspecified lack of coordination: Secondary | ICD-10-CM

## 2014-08-27 DIAGNOSIS — M62541 Muscle wasting and atrophy, not elsewhere classified, right hand: Secondary | ICD-10-CM

## 2014-08-27 DIAGNOSIS — M6281 Muscle weakness (generalized): Secondary | ICD-10-CM

## 2014-08-27 NOTE — Therapy (Signed)
[place], [place], [address] Phone: [phone]   Fax:  [phone]  Occupational Therapy Treatment  Patient Details  Name: Jerome [name] MRN: [id number] Date of Birth: July 11, 1961 Referring Provider:  Collene Mares, PA-C  Encounter Date: 08/27/2014      OT End of Session - 08/27/14 1020    Visit Number 10   Number of Visits 18   Date for OT Re-Evaluation 09/22/14   Authorization Type n/a   OT Start Time 0930   OT Stop Time 1015   OT Time Calculation (min) 45 min   Activity Tolerance Patient tolerated treatment well   Behavior During Therapy [facility] for tasks assessed/performed      Past Medical History  Diagnosis Date  . DJD (degenerative joint disease)   . Spinal stenosis   . Hypertension   . Obesity   . Thrombocytopenia     Past Surgical History  Procedure Laterality Date  . Laparoscopic gastric sleeve resection  06/2012    Dr Mickie Bail [facility])  . Breast reduction surgery    . Back surgery      x 4  . Colonoscopy N/A 10/14/2012    [id number] rectum and colon  . Flexible sigmoidoscopy N/A 10/15/2013    Procedure: FLEXIBLE SIGMOIDOSCOPY;  Surgeon: Daneil Dolin, MD;  [facility];  Service: Endoscopy;  Laterality: N/A;  8:30    There were no vitals taken for this visit.  Visit Diagnosis:  Hand muscle atrophy, left  Hand muscle atrophy, right  Lack of coordination  Hand muscle weakness      Subjective Assessment - 08/27/14 0934    Symptoms S: This morning my headache was a 11/10. It made me throw up.    Currently in Pain? Yes   Pain Score 8    Pain Location Head   Pain Orientation Posterior   Pain Descriptors / Indicators Aching   Pain Type Acute pain          [facility] OT Assessment - 08/27/14 0935    Assessment   Diagnosis Bilateral Hand Atrophy   Precautions   Precautions Fall               OT Treatments/Exercises (OP) - 08/27/14 0936    Exercises   Exercises Hand   Hand Exercises   Theraputty - Roll red using bilateral hands   Theraputty - Grip red   Theraputty - Pinch red lateral and 3 point pinch   Hand Gripper with Large Beads L: 23# 6 beads R: 69# 6 beads   Hand Gripper with Medium Beads L: 23# 13 beads R: 69# 13 beads   Hand Gripper with Small Beads L: 23# 5/12 beads R: 69# 12 beads   Sponges L: 15 low resistance   Other Hand Exercises Resistive clothespins red, blue, green, black. Placed with 3 point pinch and removed with lateral pinch. Left hand only                  OT Short Term Goals - 08/27/14 1022    OT SHORT TERM GOAL #1   Title Patient will be educated on a HEP.   OT SHORT TERM GOAL #2   Title Patient will improve bilateral grip strength by 10 pounds and pinch strength by 2 pounds for increased abiilty to maintain grasp on objects.   Status Partially Met   OT SHORT TERM GOAL #3   Title Patient will improve fine motor coordination by  decreasing completion time on the Nine Hole Peg Test by 5 seconds for increased ability to manipulate zippers and buttons.   Status Partially Met   OT SHORT TERM GOAL #4   Title Patient will improve sensation in right hand to diminished protective sensation and left hand to loss of protective sensation.   Status Partially Met           OT Long Term Goals - 08/27/14 1022    OT LONG TERM GOAL #1   Title Patient will be able to fasten his own pants and hold his new grandbaby safely, as well as complete all BADLs with highest level of independence possible.   Status On-going   OT LONG TERM GOAL #2   Title Patient will improve bilateral grip strength by 15 pounds and pinch strength by 4 pounds for increased abiilty to maintain grasp on objects.   Status On-going   OT LONG TERM GOAL #3   Title Patient will improve fine motor coordination by decreasing completion time on the Nine Hole Peg Test by 10 seconds for increased ability to manipulate zippers and buttons.   Status  On-going               Plan - 08/27/14 1021    Clinical Impression Statement A: Increased resitance on handgripper for bilateral hands. Patient was unable to grip all 12 small beads using left hand. Pt was given red theraputty for use at home.    Plan P: Cont to work on grip and pinch strengthening.         Problem List Patient Active Problem List   Diagnosis Date Noted  . Varicose veins of leg with complications 62/44/6950  . Rectal pain 10/10/2013  . Rectal bleeding 10/10/2013  . Unspecified constipation 10/10/2013  . Fecal incontinence 10/10/2013  . Heme positive stool 10/10/2013  . OSA on CPAP 08/25/2013  . HIV exposure 07/29/2013  . Encounter for screening colonoscopy 10/10/2012  . Hemorrhoids 10/10/2012    Ailene Ravel, OTR/L,CBIS  [phone]  08/27/2014, 10:23 AM  [facility] [address] [place], [place], [address] Phone: [phone]   Fax:  [phone]

## 2014-08-28 ENCOUNTER — Encounter: Payer: Self-pay | Admitting: Vascular Surgery

## 2014-08-31 ENCOUNTER — Encounter: Payer: Self-pay | Admitting: Vascular Surgery

## 2014-08-31 ENCOUNTER — Ambulatory Visit (INDEPENDENT_AMBULATORY_CARE_PROVIDER_SITE_OTHER): Payer: 59 | Admitting: Vascular Surgery

## 2014-08-31 VITALS — BP 128/87 | HR 72 | Resp 16

## 2014-08-31 DIAGNOSIS — I83892 Varicose veins of left lower extremities with other complications: Secondary | ICD-10-CM

## 2014-08-31 NOTE — Progress Notes (Signed)
Subjective:     Patient ID: Jerome [name], male   DOB: 1960/10/24, 54 y.o.   MRN: [id number]  HPI this 54 year old male had laser ablation left great saphenous vein from the distal thigh to near the saphenofemoral junction plus approximately 35 stab phlebectomy of painful varicosities performed under local tumescent anesthesia. He tolerated the procedure well. A total of 1832 J of energy was utilized.   Review of Systems     Objective:   Physical Exam BP 128/87 mmHg  Pulse 72  Resp 16       Assessment:     Well-tolerated laser ablation left great saphenous vein plus greater than 20 stab phlebectomy of painful varicosities performed under local tumescent anesthesia    Plan:     Return in 2 weeks for venous duplex exams confirm closure left great saphenous vein and this will complete patient's treatment regimen

## 2014-08-31 NOTE — Progress Notes (Signed)
     Laser Ablation Procedure    Date: 08/31/2014   Jerome [name] DOB:[birth year]  Consent signed: Yes    Surgeon:  Dr. Sherren Mocha Early  Procedure: Laser Ablation: left Greater Saphenous Vein  Dr. Kellie Simmering  BP 128/87 mmHg  Pulse 72  Resp 16  Tumescent Anesthesia: 490 cc 0.9% NaCl with 50 cc Lidocaine HCL with 1% Epi and 15 cc 8.4% NaHCO3  Local Anesthesia: 11 cc Lidocaine HCL and NaHCO3 (ratio 2:1)  Pulsed mode: 15 watts, 500 ms delay, 1.0 duration Total energy: 1832, total pulses: 124, total time: 2:03    Stab Phlebectomy: >20 Sites: Thigh and Calf  Patient tolerated procedure well  Notes:   Description of Procedure:  After marking the course of the secondary varicosities, the patient was placed on the operating table in the supine position, and the left leg was prepped and draped in sterile fashion.   Local anesthetic was administered and under ultrasound guidance the saphenous vein was accessed with a micro needle and guide wire; then the mirco puncture sheath was placed.  A guide wire was inserted saphenofemoral junction , followed by a 5 french sheath.  The position of the sheath and then the laser fiber below the junction was confirmed using the ultrasound.  Tumescent anesthesia was administered along the course of the saphenous vein using ultrasound guidance. The patient was placed in Trendelenburg position and protective laser glasses were placed on patient and staff, and the laser was fired at 15 watts continuous mode advancing 1-53mm/second for a total of 1832 joules.   For stab phlebectomies, local anesthetic was administered at the previously marked varicosities, and tumescent anesthesia was administered around the vessels.  Greater than 20 stab wounds were made using the tip of an 11 blade. And using the vein hook, the phlebectomies were performed using a hemostat to avulse the varicosities.  Adequate hemostasis was achieved.     Steri strips were applied to the stab wounds  and ABD pads and thigh high compression stockings were applied.  Ace wrap bandages were applied over the phlebectomy sites and at the top of the saphenofemoral junction. Blood loss was less than 15 cc.  The patient ambulated out of the operating room having tolerated the procedure well.

## 2014-09-01 ENCOUNTER — Encounter (HOSPITAL_COMMUNITY): Payer: 59

## 2014-09-01 ENCOUNTER — Telehealth: Payer: Self-pay | Admitting: *Deleted

## 2014-09-01 NOTE — Telephone Encounter (Signed)
Pt had pain during the night and a little bleeding through the wraps. That all is better. Planning on re-wrapping later this am. Following all instructions but taking less of the Ibuprofen because he is worried since his platlet count being low. Will see him on 2/22.

## 2014-09-03 ENCOUNTER — Encounter (HOSPITAL_COMMUNITY): Payer: Self-pay

## 2014-09-03 ENCOUNTER — Ambulatory Visit (HOSPITAL_COMMUNITY)
Admission: RE | Admit: 2014-09-03 | Discharge: 2014-09-03 | Disposition: A | Discharge status: Home / Self Care | Payer: 59 | Source: Ambulatory Visit | Attending: Family Medicine | Admitting: Family Medicine

## 2014-09-03 DIAGNOSIS — M6281 Muscle weakness (generalized): Secondary | ICD-10-CM

## 2014-09-03 DIAGNOSIS — M62542 Muscle wasting and atrophy, not elsewhere classified, left hand: Secondary | ICD-10-CM

## 2014-09-03 DIAGNOSIS — M62541 Muscle wasting and atrophy, not elsewhere classified, right hand: Secondary | ICD-10-CM

## 2014-09-03 NOTE — Therapy (Signed)
[place], [place], [address] Phone: [phone]   Fax:  [phone]  Occupational Therapy Treatment  Patient Details  Name: Jerome [name] MRN: [id number] Date of Birth: 1961/07/16 Referring Provider:  Sharilyn Sites, MD  Encounter Date: 09/03/2014      OT End of Session - 09/03/14 1059    Visit Number 11   Number of Visits 18   Date for OT Re-Evaluation 09/22/14   Authorization Type n/a   OT Start Time 0933   OT Stop Time 1015   OT Time Calculation (min) 42 min   Activity Tolerance Patient tolerated treatment well   Behavior During Therapy [facility] for tasks assessed/performed      Past Medical History  Diagnosis Date  . DJD (degenerative joint disease)   . Spinal stenosis   . Hypertension   . Obesity   . Thrombocytopenia     Past Surgical History  Procedure Laterality Date  . Laparoscopic gastric sleeve resection  06/2012    Dr Mickie Bail [facility])  . Breast reduction surgery    . Back surgery      x 4  . Colonoscopy N/A 10/14/2012    [id number] rectum and colon  . Flexible sigmoidoscopy N/A 10/15/2013    Procedure: FLEXIBLE SIGMOIDOSCOPY;  Surgeon: Daneil Dolin, MD;  [facility];  Service: Endoscopy;  Laterality: N/A;  8:30    There were no vitals taken for this visit.  Visit Diagnosis:  Hand muscle atrophy, left  Hand muscle atrophy, right  Hand muscle weakness      Subjective Assessment - 09/03/14 0949    Currently in Pain? Yes   Pain Score 9    Pain Location Head   Pain Descriptors / Indicators Aching          [facility] OT Assessment - 09/03/14 0950    Assessment   Diagnosis Bilateral Hand Atrophy   Precautions   Precautions Fall               OT Treatments/Exercises (OP) - 09/03/14 0950    Exercises   Exercises Hand   Hand Exercises   Theraputty - Flatten green- seated   Theraputty - Roll green using bilateral hands   Theraputty - Grip green   Theraputty -  Pinch green lateral and 3 point   Sponges L: 11, 15 low resistance   Other Hand Exercises Rotating ball clockwise and counter clockwise using fingertips of left hand; 3X each direction   Other Hand Exercises Rotating silver Chinese balls clockwise and counter clockwise in left hand 5X with min-mod difficulty                  OT Short Term Goals - 08/27/14 1022    OT SHORT TERM GOAL #1   Title Patient will be educated on a HEP.   OT SHORT TERM GOAL #2   Title Patient will improve bilateral grip strength by 10 pounds and pinch strength by 2 pounds for increased abiilty to maintain grasp on objects.   Status Partially Met   OT SHORT TERM GOAL #3   Title Patient will improve fine motor coordination by decreasing completion time on the Nine Hole Peg Test by 5 seconds for increased ability to manipulate zippers and buttons.   Status Partially Met   OT SHORT TERM GOAL #4   Title Patient will improve sensation in right hand to diminished protective sensation and left hand to loss of protective sensation.  Status Partially Met           OT Long Term Goals - 08/27/14 1022    OT LONG TERM GOAL #1   Title Patient will be able to fasten his own pants and hold his new grandbaby safely, as well as complete all BADLs with highest level of independence possible.   Status On-going   OT LONG TERM GOAL #2   Title Patient will improve bilateral grip strength by 15 pounds and pinch strength by 4 pounds for increased abiilty to maintain grasp on objects.   Status On-going   OT LONG TERM GOAL #3   Title Patient will improve fine motor coordination by decreasing completion time on the Nine Hole Peg Test by 10 seconds for increased ability to manipulate zippers and buttons.   Status On-going               Plan - 09/03/14 1059    Clinical Impression Statement A: patient was given a MD referral to take to Dr. Hilma Favors to have add neck and shoulder pain into treatment.   Plan P: Assess  neck and shoulder pain if patient has a signed order from MD and add into treatment plan.        Problem List Patient Active Problem List   Diagnosis Date Noted  . Varicose veins of leg with complications 16/57/9038  . Rectal pain 10/10/2013  . Rectal bleeding 10/10/2013  . Unspecified constipation 10/10/2013  . Fecal incontinence 10/10/2013  . Heme positive stool 10/10/2013  . OSA on CPAP 08/25/2013  . HIV exposure 07/29/2013  . Encounter for screening colonoscopy 10/10/2012  . Hemorrhoids 10/10/2012    Ailene Ravel, OTR/L,CBIS  [phone]  09/03/2014, 11:30 AM  [facility] [address] [place], [place], [address] Phone: [phone]   Fax:  [phone]

## 2014-09-04 ENCOUNTER — Telehealth: Payer: Self-pay

## 2014-09-04 NOTE — Telephone Encounter (Signed)
Received a PA request from [employer] for [employer]. Sent PA to insurance- it was denied because the pt has not tried and failed movantik.

## 2014-09-04 NOTE — Addendum Note (Signed)
Encounter addended by: Debby Bud, OT on: 09/04/2014  2:31 PM<BR>     Documentation filed: Letters

## 2014-09-08 ENCOUNTER — Encounter: Payer: Self-pay | Admitting: Vascular Surgery

## 2014-09-08 ENCOUNTER — Telehealth: Payer: Self-pay | Admitting: Nurse Practitioner

## 2014-09-08 ENCOUNTER — Ambulatory Visit (HOSPITAL_COMMUNITY)
Admission: RE | Admit: 2014-09-08 | Discharge: 2014-09-08 | Disposition: A | Discharge status: Home / Self Care | Payer: 59 | Source: Ambulatory Visit | Attending: Family Medicine | Admitting: Family Medicine

## 2014-09-08 ENCOUNTER — Encounter (HOSPITAL_COMMUNITY): Payer: Self-pay

## 2014-09-08 DIAGNOSIS — M62542 Muscle wasting and atrophy, not elsewhere classified, left hand: Secondary | ICD-10-CM | POA: Diagnosis not present

## 2014-09-08 DIAGNOSIS — K5909 Other constipation: Secondary | ICD-10-CM

## 2014-09-08 DIAGNOSIS — M6281 Muscle weakness (generalized): Secondary | ICD-10-CM

## 2014-09-08 DIAGNOSIS — R279 Unspecified lack of coordination: Secondary | ICD-10-CM

## 2014-09-08 MED ORDER — NALOXEGOL OXALATE 25 MG PO TABS: 25.0000 mg | ORAL_TABLET | Freq: Every day | ORAL | Status: DC

## 2014-09-08 NOTE — Telephone Encounter (Signed)
Please notify patient: Will call in Movantik 25 mg once daily, in the morning, on an empty stomach. Advise him to stop all maintenance laxatives. Let us know how he's doing with this after a couple weeks. If not working, can try adding other laxatives back in. If it is too much we can decrease the dose.

## 2014-09-08 NOTE — Telephone Encounter (Signed)
Called in [place] for constipation, insurance denied Amitiza due to patient has not tried/failed Movantik yet.

## 2014-09-08 NOTE — Therapy (Signed)
[place], [place], [address] Phone: [phone]   Fax:  [phone]  Occupational Therapy Treatment  Patient Details  Name: Jerome [name] MRN: [id number] Date of Birth: 1961/01/03 Referring Provider:  Sharilyn Sites, MD  Encounter Date: 09/08/2014      OT End of Session - 09/08/14 1153    Visit Number 12   Number of Visits 18   Date for OT Re-Evaluation 09/22/14   Authorization Type n/a   OT Start Time 1100   OT Stop Time 1150   OT Time Calculation (min) 50 min   Activity Tolerance Patient tolerated treatment well   Behavior During Therapy [facility] for tasks assessed/performed      Past Medical History  Diagnosis Date  . DJD (degenerative joint disease)   . Spinal stenosis   . Hypertension   . Obesity   . Thrombocytopenia     Past Surgical History  Procedure Laterality Date  . Laparoscopic gastric sleeve resection  06/2012    Dr Mickie Bail [facility])  . Breast reduction surgery    . Back surgery      x 4  . Colonoscopy N/A 10/14/2012    [id number] rectum and colon  . Flexible sigmoidoscopy N/A 10/15/2013    Procedure: FLEXIBLE SIGMOIDOSCOPY;  Surgeon: Daneil Dolin, MD;  [facility];  Service: Endoscopy;  Laterality: N/A;  8:30    There were no vitals taken for this visit.  Visit Diagnosis:  Hand muscle weakness  Lack of coordination      Subjective Assessment - 09/08/14 1107    Symptoms S: My head was hurting really bad over the weekend, I just can do anything when my head is hurting that bad.    Currently in Pain? Yes   Pain Score 7    Pain Location Head   Pain Descriptors / Indicators Aching                 OT Treatments/Exercises (OP) - 09/08/14 1111    Neurological Re-education Exercises   Hand Gripper with Large Beads R: 69# 6 beads L: 23# 6 beads   Hand Gripper with Medium Beads R: 69# 13 beads L: 23# 13 beads   Hand Gripper with Small Beads R: 69# 17 beads L: 20#  17 beads   Theraputty - Roll red using bilateral hands   Theraputty - Grip red   Theraputty - Pinch red lateral and 3 point   Theraputty - Locate Pegs red-8 beads-bilateral hands   Other Grasp and Release Exercises  Patient used pvc piping to push/cut holes in red putty                  OT Short Term Goals - 08/27/14 1022    OT SHORT TERM GOAL #1   Title Patient will be educated on a HEP.   OT SHORT TERM GOAL #2   Title Patient will improve bilateral grip strength by 10 pounds and pinch strength by 2 pounds for increased abiilty to maintain grasp on objects.   Status Partially Met   OT SHORT TERM GOAL #3   Title Patient will improve fine motor coordination by decreasing completion time on the Nine Hole Peg Test by 5 seconds for increased ability to manipulate zippers and buttons.   Status Partially Met   OT SHORT TERM GOAL #4   Title Patient will improve sensation in right hand to diminished protective sensation and left hand to loss of  protective sensation.   Status Partially Met           OT Long Term Goals - 08/27/14 1022    OT LONG TERM GOAL #1   Title Patient will be able to fasten his own pants and hold his new grandbaby safely, as well as complete all BADLs with highest level of independence possible.   Status On-going   OT LONG TERM GOAL #2   Title Patient will improve bilateral grip strength by 15 pounds and pinch strength by 4 pounds for increased abiilty to maintain grasp on objects.   Status On-going   OT LONG TERM GOAL #3   Title Patient will improve fine motor coordination by decreasing completion time on the Nine Hole Peg Test by 10 seconds for increased ability to manipulate zippers and buttons.   Status On-going               Plan - 09/08/14 1153    Clinical Impression Statement A: Utilized red theraputty this session, added pvc pipe exercise. Patient did not bring MD referral, states he will bring to next session.    Plan P: Assess neck and  shoulder pain if patient has a signed order from MD and add into treatment plan.        Problem List Patient Active Problem List   Diagnosis Date Noted  . Varicose veins of leg with complications 20/04/711  . Rectal pain 10/10/2013  . Rectal bleeding 10/10/2013  . Unspecified constipation 10/10/2013  . Fecal incontinence 10/10/2013  . Heme positive stool 10/10/2013  . OSA on CPAP 08/25/2013  . HIV exposure 07/29/2013  . Encounter for screening colonoscopy 10/10/2012  . Hemorrhoids 10/10/2012   Ailene Ravel, OTR/L,CBIS  [phone]  09/08/2014, 11:59 AM  [place], [place], [address] Phone: [phone]   Fax:  [phone]

## 2014-09-09 NOTE — Telephone Encounter (Signed)
Had to do a PA for movantik. Faxed to insurance co.

## 2014-09-10 ENCOUNTER — Ambulatory Visit (HOSPITAL_COMMUNITY): Payer: 59 | Admitting: Specialist

## 2014-09-10 NOTE — Telephone Encounter (Signed)
PA has been approved. Pt is aware. He doesn't want to try the [facility] but he will because the insurance says he has too. He said the [place] was working well. He will try it for 30 days and let me know how its doing and if it doesn't work for him he will let me know and we will try PA for amitiza again.

## 2014-09-11 ENCOUNTER — Encounter: Payer: Self-pay | Admitting: Surgery

## 2014-09-14 ENCOUNTER — Ambulatory Visit (INDEPENDENT_AMBULATORY_CARE_PROVIDER_SITE_OTHER): Payer: Self-pay | Admitting: Vascular Surgery

## 2014-09-14 ENCOUNTER — Ambulatory Visit (HOSPITAL_COMMUNITY)
Admission: RE | Admit: 2014-09-14 | Discharge: 2014-09-14 | Disposition: A | Discharge status: Home / Self Care | Payer: 59 | Source: Ambulatory Visit | Attending: Vascular Surgery | Admitting: Vascular Surgery

## 2014-09-14 ENCOUNTER — Ambulatory Visit (HOSPITAL_COMMUNITY): Payer: 59 | Admitting: Specialist

## 2014-09-14 ENCOUNTER — Encounter: Payer: Self-pay | Admitting: Vascular Surgery

## 2014-09-14 VITALS — BP 111/77 | HR 68 | Resp 16 | Ht 73.0 in | Wt 250.0 lb

## 2014-09-14 DIAGNOSIS — M25519 Pain in unspecified shoulder: Secondary | ICD-10-CM

## 2014-09-14 DIAGNOSIS — M25619 Stiffness of unspecified shoulder, not elsewhere classified: Secondary | ICD-10-CM

## 2014-09-14 DIAGNOSIS — M542 Cervicalgia: Secondary | ICD-10-CM

## 2014-09-14 DIAGNOSIS — I83892 Varicose veins of left lower extremities with other complications: Secondary | ICD-10-CM

## 2014-09-14 DIAGNOSIS — M62542 Muscle wasting and atrophy, not elsewhere classified, left hand: Secondary | ICD-10-CM | POA: Diagnosis not present

## 2014-09-14 NOTE — Therapy (Signed)
[place], [place], [address] Phone: [phone]   Fax:  [phone]  Occupational Therapy Treatment  Patient Details  Name: Jerome [name] MRN: [id number] Date of Birth: 03/22/1961 Referring Provider:  Sharilyn Sites, MD  Encounter Date: 09/14/2014      OT End of Session - 09/14/14 1547    Visit Number 13   Number of Visits 18   Date for OT Re-Evaluation 09/22/14   OT Start Time 1432   OT Stop Time 1515   OT Time Calculation (min) 43 min   Activity Tolerance Patient tolerated treatment well   Behavior During Therapy [facility] for tasks assessed/performed      Past Medical History  Diagnosis Date  . DJD (degenerative joint disease)   . Spinal stenosis   . Hypertension   . Obesity   . Thrombocytopenia     Past Surgical History  Procedure Laterality Date  . Laparoscopic gastric sleeve resection  06/2012    Dr Mickie Bail [facility])  . Breast reduction surgery    . Back surgery      x 4  . Colonoscopy N/A 10/14/2012    CHE:[id number] rectum and colon  . Flexible sigmoidoscopy N/A 10/15/2013    Procedure: FLEXIBLE SIGMOIDOSCOPY;  Surgeon: Daneil Dolin, MD;  [facility];  Service: Endoscopy;  Laterality: N/A;  8:30    There were no vitals taken for this visit.  Visit Diagnosis:  Pain in joint, shoulder region, unspecified laterality  Neck pain, bilateral  Decreased shoulder mobility, unspecified laterality      Subjective Assessment - 09/14/14 1537    Symptoms S:  I brought the order for you to work on my shoulders and neck today.  I am also concerned because my right hand has more numb areas than it did previously.  The tips of my fingers, especially my index finger is numb.   Currently in Pain? Yes   Pain Score 5    Pain Location Shoulder   Pain Orientation Left   Pain Descriptors / Indicators Aching   Pain Type Chronic pain   Pain Onset More than a month ago   Pain Frequency Intermittent           [facility] OT Assessment - 09/14/14 0001    Precautions   Precautions Cervical   Precaution Comments has had several neck surgeries - avoid manual cervical traction and MFR near the spinal column   Palpation   Palpation mod-max fascial restrictions in bilateral upper arm, scapular region   AROM   Right Shoulder Flexion 170 Degrees   Right Shoulder ABduction 135 Degrees   Right Shoulder Internal Rotation 60 Degrees   Right Shoulder External Rotation 60 Degrees   Left Shoulder Flexion 130 Degrees   Left Shoulder ABduction 140 Degrees   Left Shoulder Internal Rotation 40 Degrees   Left Shoulder External Rotation 35 Degrees   Cervical Flexion WFL   Cervical Extension 13.0 cm   Cervical - Right Side Bend 20 degrees   Cervical - Left Side Bend 15 degrees   Cervical - Right Rotation 20 degrees   Cervical - Left Rotation 23 degrees   Strength   Overall Strength Comments assessed in seated, ER/IR with shoulder abducted to 90   Right Shoulder Flexion 5/5   Right Shoulder Extension 4+/5   Right Shoulder ABduction 4+/5   Right Shoulder Internal Rotation 4+/5   Right Shoulder External Rotation 4+/5   Left Shoulder Flexion 4-/5  Left Shoulder Extension 4-/5   Left Shoulder ABduction 4-/5   Left Shoulder Internal Rotation --  4-/5   Left Shoulder External Rotation 4-/5               OT Treatments/Exercises (OP) - 09/14/14 0001    Neck Exercises: Seated   Cervical Rotation Right;Left;10 reps   Lateral Flexion Right;Left;10 reps   Other Seated Exercise cervical flexion and extension 10 times AROM   Shoulder Exercises: Supine   External Rotation PROM;Both;10 reps   Internal Rotation PROM;Both;10 reps   Flexion PROM;Both;10 reps   ABduction PROM;Both;10 reps   Manual Therapy   Manual Therapy Myofascial release   Myofascial Release MFR to bilateral scapular regions, upper arm, shoulder region, and sternocleidomastoid region to decrease pain and fascial restrictions and  improve pain free mobility in bilateral upper extremity and cervical region                OT Education - 09/14/14 1512    Education provided Yes   Education Details towel slides and shoulder stretches   Person(s) Educated Patient   Methods Explanation;Handout;Demonstration   Comprehension Verbalized understanding;Returned demonstration          OT Short Term Goals - 09/14/14 1550    OT SHORT TERM GOAL #1   Title Patient will be educated on a HEP.   Status Achieved   OT SHORT TERM GOAL #2   Title Patient will improve bilateral grip strength by 10 pounds and pinch strength by 2 pounds for increased abiilty to maintain grasp on objects.   Status Partially Met   OT SHORT TERM GOAL #3   Title Patient will improve fine motor coordination by decreasing completion time on the Nine Hole Peg Test by 5 seconds for increased ability to manipulate zippers and buttons.   Status Partially Met   OT SHORT TERM GOAL #4   Title Patient will improve sensation in right hand to diminished protective sensation and left hand to loss of protective sensation.   Status Partially Met   OT SHORT TERM GOAL #5   Title Patient will improve bilateral shoulder and cervical AROM by50% for improved ability to reach overhead and put items into closet.   Time 4   Period Weeks   Status New   Additional Short Term Goals   Additional Short Term Goals Yes   OT SHORT TERM GOAL #6   Title Patient will improve bilateral shoulder strength to 4+/5 or better for increased ability to lift bags of groceries.    Time 4   Period Weeks           OT Long Term Goals - 08/27/14 1022    OT LONG TERM GOAL #1   Title Patient will be able to fasten his own pants and hold his new grandbaby safely, as well as complete all BADLs with highest level of independence possible.   Status On-going   OT LONG TERM GOAL #2   Title Patient will improve bilateral grip strength by 15 pounds and pinch strength by 4 pounds for  increased abiilty to maintain grasp on objects.   Status On-going   OT LONG TERM GOAL #3   Title Patient will improve fine motor coordination by decreasing completion time on the Nine Hole Peg Test by 10 seconds for increased ability to manipulate zippers and buttons.   Status On-going               Plan - 09/14/14 1548  Clinical Impression Statement A:  Evaluated patients bilateral shoulder and cervical region this date.  Sent assessment to MD for treatment plan signature.  Patient has decreased AROM and strength in bilateral shoulders L > R, and cervical region, L > R   Plan P:  Incorporate neck and shoulder manual therapy and therapeutic exerices into treatment session.        Problem List Patient Active Problem List   Diagnosis Date Noted  . Varicose veins of leg with complications 92/42/6834  . Rectal pain 10/10/2013  . Rectal bleeding 10/10/2013  . Unspecified constipation 10/10/2013  . Fecal incontinence 10/10/2013  . Heme positive stool 10/10/2013  . OSA on CPAP 08/25/2013  . HIV exposure 07/29/2013  . Encounter for screening colonoscopy 10/10/2012  . Hemorrhoids 10/10/2012    Vangie Bicker, OTR/L [phone]  09/14/2014, 3:53 PM  [facility] [address] [place], [place], [address] Phone: [phone]   Fax:  [phone]

## 2014-09-14 NOTE — Progress Notes (Signed)
Subjective:     Patient ID: Jerome [name], male   DOB: 1960-11-14, 54 y.o.   MRN: [id number]  HPI this 54 year old male returns 2 weeks post laser ablation left great saphenous vein from the proximal calf to near the saphenofemoral junction plus greater than 20 stab phlebectomy of painful varicosities. He has had some moderate discomfort particularly the first few days in the proximal thigh but that has now resolved. He does have some bruising. He has not had any distal edema. He has worn his elastic compression stockings as instructed and taken ibuprofen.   Review of Systems     Objective:   Physical Exam BP 111/77 mmHg  Pulse 68  Resp 16  Ht 6\' 1"  (1.854 m)  Wt 250 lb (113.399 kg)  BMI 32.99 kg/m2  Gen. well-developed well-nourished male no apparent distress alert and oriented 3 Left leg with moderate ecchymosis in proximal groin and distal thigh area. No distal edema. 3+ dorsalis pedis pulse palpable. Stab phlebectomy sites healing nicely. Mild discomfort over great saphenous vein.  Today I ordered a venous duplex exam the left leg which I reviewed and interpreted. There is no DVT. The left great saphenous vein is totally closed from the distal thigh to near the saphenofemoral junction     Assessment:     Successful laser ablation left great saphenous vein and multiple stab phlebectomy of painful varicosities    Plan:     Return to see me on when necessary basis

## 2014-09-14 NOTE — Patient Instructions (Signed)
HOLD EACH STRETCH 10 SECONDS, DO 3 REPETITIONS OF EACH STRETCH, 2-3 TIMES PER DAY - STRETCHES SHOULD NOT HURT, JUST BE A GENTLE STRETCH Flexibility: Warehouse manager   Standing in corner with hands just above shoulder level and feet ____ inches from corner, lean forward until a comfortable stretch is felt across chest. Hold ____ seconds. Repeat ____ times per set. Do ____ sets per session. Do ____ sessions per day.  http://orth.exer.us/342   Copyright  VHI. All rights reserved.   Scapular Retraction (Standing)   With arms at sides, pinch shoulder blades together. Repeat ____ times per set. Do ____ sets per session. Do ____ sessions per day.  http://orth.exer.us/944   Copyright  VHI. All rights reserved.    Copyright  VHI. All rights reserved.   Posterior Capsule Stretch   Stand or sit, one arm across body so hand rests over opposite shoulder. Gently push on crossed elbow with other hand until stretch is felt in shoulder of crossed arm. Hold ___ seconds.  Repeat ___ times per session. Do ___ sessions per day.  Copyright  VHI. All rights reserved.   Flexors Stretch, Standing   Stand near wall and slide arm up, with palm facing away from wall, by leaning toward wall. Hold ___ seconds.  Repeat ___ times per session. Do ___ sessions per day.  Copyright  VHI. All rights reserved.   COMPLETE EACH  TOWEL SLIDE FOR 30" EACH, GRADUALLY INCREASE TO 3 MINUTES, COMPLETE 2-3 TIMES PER DAY SHOULDER: Flexion On Table   Place hands on table, elbows straight. Move hips away from body. Press hands down into table. Hold ___ seconds. ___ reps per set, ___ sets per day, ___ days per week  Abduction (Passive)   With arm out to side, resting on table, lower head toward arm, keeping trunk away from table. Hold ____ seconds. Repeat ____ times. Do ____ sessions per day.  Copyright  VHI. All rights reserved.     Internal Rotation (Assistive)   Seated with elbow bent at right angle  and held against side, slide arm on table surface in an inward arc. Repeat ____ times. Do ____ sessions per day. Activity: Use this motion to brush crumbs off the table.  Copyright  VHI. All rights reserved.

## 2014-09-16 ENCOUNTER — Ambulatory Visit (HOSPITAL_COMMUNITY): Payer: 59 | Admitting: Specialist

## 2014-09-16 DIAGNOSIS — M25619 Stiffness of unspecified shoulder, not elsewhere classified: Secondary | ICD-10-CM

## 2014-09-16 DIAGNOSIS — M25519 Pain in unspecified shoulder: Secondary | ICD-10-CM

## 2014-09-16 DIAGNOSIS — M62542 Muscle wasting and atrophy, not elsewhere classified, left hand: Secondary | ICD-10-CM | POA: Diagnosis not present

## 2014-09-16 DIAGNOSIS — M542 Cervicalgia: Secondary | ICD-10-CM

## 2014-09-16 NOTE — Telephone Encounter (Signed)
Perfect, thanks 

## 2014-09-16 NOTE — Therapy (Signed)
[place], [place], [address] Phone: [phone]   Fax:  [phone]  Occupational Therapy Treatment  Patient Details  Name: Jerome [name] MRN: [id number] Date of Birth: 09-17-1960 Referring Provider:  Sharilyn Sites, MD  Encounter Date: 09/16/2014      OT End of Session - 09/16/14 1146    Visit Number 14   Number of Visits 18   Date for OT Re-Evaluation 09/22/14   OT Start Time 0938   OT Stop Time 1015   OT Time Calculation (min) 37 min   Activity Tolerance Patient tolerated treatment well   Behavior During Therapy [facility] for tasks assessed/performed      Past Medical History  Diagnosis Date  . DJD (degenerative joint disease)   . Spinal stenosis   . Hypertension   . Obesity   . Thrombocytopenia     Past Surgical History  Procedure Laterality Date  . Laparoscopic gastric sleeve resection  06/2012    Dr Mickie Bail [facility])  . Breast reduction surgery    . Back surgery      x 4  . Colonoscopy N/A 10/14/2012    [id number] rectum and colon  . Flexible sigmoidoscopy N/A 10/15/2013    Procedure: FLEXIBLE SIGMOIDOSCOPY;  Surgeon: Daneil Dolin, MD;  [facility];  Service: Endoscopy;  Laterality: N/A;  8:30    There were no vitals taken for this visit.  Visit Diagnosis:  Pain in joint, shoulder region, unspecified laterality  Decreased shoulder mobility, unspecified laterality  Neck pain, bilateral      Subjective Assessment - 09/16/14 0941    Symptoms S;  The massage helped last time,    Currently in Pain? Yes   Pain Score 5    Pain Location Shoulder   Pain Orientation Left   Pain Descriptors / Indicators Aching   Pain Type Chronic pain          [facility] OT Assessment - 09/16/14 0001    Precautions   Precautions Cervical   Precaution Comments has had several neck surgeries - avoid manual cervical traction and MFR near the spinal column               OT  Treatments/Exercises (OP) - 09/16/14 0001    Exercises   Exercises Shoulder   Shoulder Exercises: Supine   Protraction PROM;AAROM;10 reps   Horizontal ABduction PROM;AAROM;10 reps   External Rotation PROM;AAROM;10 reps   Internal Rotation PROM;AAROM;10 reps   Flexion PROM;AAROM;10 reps   ABduction PROM;AAROM;10 reps   Manual Therapy   Manual Therapy Myofascial release   Myofascial Release MFR to bilateral scapular regions, upper arm, shoulder region, and sternocleidomastoid region to decrease pain and fascial restrictions and improve pain free mobility in bilateral upper extremity and cervical region                  OT Short Term Goals - 09/14/14 1550    OT SHORT TERM GOAL #1   Title Patient will be educated on a HEP.   Status Achieved   OT SHORT TERM GOAL #2   Title Patient will improve bilateral grip strength by 10 pounds and pinch strength by 2 pounds for increased abiilty to maintain grasp on objects.   Status Partially Met   OT SHORT TERM GOAL #3   Title Patient will improve fine motor coordination by decreasing completion time on the Nine Hole Peg Test by 5 seconds for increased ability to manipulate zippers and  buttons.   Status Partially Met   OT SHORT TERM GOAL #4   Title Patient will improve sensation in right hand to diminished protective sensation and left hand to loss of protective sensation.   Status Partially Met   OT SHORT TERM GOAL #5   Title Patient will improve bilateral shoulder and cervical AROM by50% for improved ability to reach overhead and put items into closet.   Time 4   Period Weeks   Status New   Additional Short Term Goals   Additional Short Term Goals Yes   OT SHORT TERM GOAL #6   Title Patient will improve bilateral shoulder strength to 4+/5 or better for increased ability to lift bags of groceries.    Time 4   Period Weeks           OT Long Term Goals - 08/27/14 1022    OT LONG TERM GOAL #1   Title Patient will be able to  fasten his own pants and hold his new grandbaby safely, as well as complete all BADLs with highest level of independence possible.   Status On-going   OT LONG TERM GOAL #2   Title Patient will improve bilateral grip strength by 15 pounds and pinch strength by 4 pounds for increased abiilty to maintain grasp on objects.   Status On-going   OT LONG TERM GOAL #3   Title Patient will improve fine motor coordination by decreasing completion time on the Nine Hole Peg Test by 10 seconds for increased ability to manipulate zippers and buttons.   Status On-going               Plan - 09/16/14 1147    Clinical Impression Statement A:  Treatment focused on cervical and shoulder MFR this date.  Patient had improved PROM and AROM from previous session.   Plan P:  Attempt AROM of bilateral shoulders, hand strengthening exercises that incoroprate shoulder AROM.        Problem List Patient Active Problem List   Diagnosis Date Noted  . Varicose veins of leg with complications 40/37/5436  . Rectal pain 10/10/2013  . Rectal bleeding 10/10/2013  . Unspecified constipation 10/10/2013  . Fecal incontinence 10/10/2013  . Heme positive stool 10/10/2013  . OSA on CPAP 08/25/2013  . HIV exposure 07/29/2013  . Encounter for screening colonoscopy 10/10/2012  . Hemorrhoids 10/10/2012    Vangie Bicker, OTR/L [phone]  09/16/2014, 11:49 AM  [facility] [address] [place], [place], [address] Phone: [phone]   Fax:  [phone]

## 2014-09-16 NOTE — Telephone Encounter (Signed)
Pt called- he is having episodes of severe anxiety with the movantik. He is also having to use enemas to have a BM. Pt would like to go back on amitiza.   Pt also wants to know if we can send in a rectal cream for him to use. Since he has been constipated, he is having some irritation again. He said last time this happened, Dr.McGough gave him a 2% hydrocortisone cream to use.

## 2014-09-17 ENCOUNTER — Other Ambulatory Visit: Payer: Self-pay | Admitting: Nurse Practitioner

## 2014-09-17 DIAGNOSIS — K649 Unspecified hemorrhoids: Secondary | ICD-10-CM

## 2014-09-17 MED ORDER — HYDROCORTISONE 2.5 % RE CREA: 1.0000 "application " | TOPICAL_CREAM | Freq: Two times a day (BID) | RECTAL | Status: DC | PRN

## 2014-09-17 NOTE — Telephone Encounter (Signed)
Prior auth for Baker Pierini has been started due to failed Movantik with intolerable side effects along with no symptomatic improvement. Will call in the rectal cream for hemorrhoid relief. Please notify patient

## 2014-09-21 ENCOUNTER — Ambulatory Visit (HOSPITAL_COMMUNITY): Payer: 59 | Admitting: Specialist

## 2014-09-22 NOTE — Telephone Encounter (Signed)
Pt is aware.  

## 2014-09-23 ENCOUNTER — Ambulatory Visit (HOSPITAL_COMMUNITY): Payer: 59 | Attending: Family Medicine | Admitting: Specialist

## 2014-09-23 DIAGNOSIS — R200 Anesthesia of skin: Secondary | ICD-10-CM | POA: Insufficient documentation

## 2014-09-23 DIAGNOSIS — R279 Unspecified lack of coordination: Secondary | ICD-10-CM

## 2014-09-23 DIAGNOSIS — M62542 Muscle wasting and atrophy, not elsewhere classified, left hand: Secondary | ICD-10-CM | POA: Diagnosis present

## 2014-09-23 DIAGNOSIS — R278 Other lack of coordination: Secondary | ICD-10-CM | POA: Insufficient documentation

## 2014-09-23 DIAGNOSIS — M6281 Muscle weakness (generalized): Secondary | ICD-10-CM

## 2014-09-23 NOTE — Therapy (Signed)
[place], [place], [address] Phone: [phone]   Fax:  [phone]  Occupational Therapy Treatment  Patient Details  Name: Jerome [name] MRN: [id number] Date of Birth: 24-Sep-1960 Referring Provider:  Sharilyn Sites, MD  Encounter Date: 09/23/2014      OT End of Session - 09/23/14 1058    Visit Number 15   Number of Visits 18   Date for OT Re-Evaluation 10/24/14  mini reassess 09/26/14   OT Start Time 0932   OT Stop Time 1015   OT Time Calculation (min) 43 min   Activity Tolerance Patient tolerated treatment well   Behavior During Therapy [facility] for tasks assessed/performed      Past Medical History  Diagnosis Date  . DJD (degenerative joint disease)   . Spinal stenosis   . Hypertension   . Obesity   . Thrombocytopenia     Past Surgical History  Procedure Laterality Date  . Laparoscopic gastric sleeve resection  06/2012    Dr Mickie Bail [facility])  . Breast reduction surgery    . Back surgery      x 4  . Colonoscopy N/A 10/14/2012    [id number] rectum and colon  . Flexible sigmoidoscopy N/A 10/15/2013    Procedure: FLEXIBLE SIGMOIDOSCOPY;  Surgeon: Daneil Dolin, MD;  [facility];  Service: Endoscopy;  Laterality: N/A;  8:30    There were no vitals taken for this visit.  Visit Diagnosis:  Hand muscle weakness  Lack of coordination      Subjective Assessment - 09/23/14 0934    Symptoms S:  I feel like we need to work on my hands, I have been dropping alot of items.   Currently in Pain? No/denies          [facility] OT Assessment - 09/23/14 0001    Assessment   Diagnosis Bilateral Hand Atrophy   Precautions   Precautions Cervical   Precaution Comments has had several neck surgeries - avoid manual cervical traction and MFR near the spinal column               OT Treatments/Exercises (OP) - 09/23/14 0001    Exercises   Exercises Hand;Wrist   Wrist Exercises   Forearm  Supination Strengthening;10 reps;Both   Bar Weights/Barbell (Forearm Supination) 1 lb   Forearm Pronation Strengthening;Both;10 reps   Bar Weights/Barbell (Forearm Pronation) 1 lb   Wrist Flexion Strengthening;Both;10 reps   Bar Weights/Barbell (Wrist Flexion) 1 lb   Wrist Extension Strengthening;10 reps;Both   Bar Weights/Barbell (Wrist Extension) 1 lb   Wrist Radial Deviation Strengthening;Both;10 reps   Bar Weights/Barbell (Radial Deviation) 1 lb   Fine Motor Coordination   Tendon Glides 10 times   Additional Wrist Exercises   Theraputty - Flatten green- seated   Theraputty - Roll green using bilateral hands   Theraputty - Grip green supinated and pronated   Theraputty - Pinch green supinated and pronated   Hand Exercises   Digit Composite ABduction AROM;Both;10 reps   Digit Composite ADduction AROM;10 reps;Both   Opposition AROM;Both;5 reps   Fine Motor Coordination   Tendon Glides 5X bilateral                  OT Short Term Goals - 09/14/14 1550    OT SHORT TERM GOAL #1   Title Patient will be educated on a HEP.   Status Achieved   OT SHORT TERM GOAL #2   Title Patient will  improve bilateral grip strength by 10 pounds and pinch strength by 2 pounds for increased abiilty to maintain grasp on objects.   Status Partially Met   OT SHORT TERM GOAL #3   Title Patient will improve fine motor coordination by decreasing completion time on the Nine Hole Peg Test by 5 seconds for increased ability to manipulate zippers and buttons.   Status Partially Met   OT SHORT TERM GOAL #4   Title Patient will improve sensation in right hand to diminished protective sensation and left hand to loss of protective sensation.   Status Partially Met   OT SHORT TERM GOAL #5   Title Patient will improve bilateral shoulder and cervical AROM by50% for improved ability to reach overhead and put items into closet.   Time 4   Period Weeks   Status New   Additional Short Term Goals    Additional Short Term Goals Yes   OT SHORT TERM GOAL #6   Title Patient will improve bilateral shoulder strength to 4+/5 or better for increased ability to lift bags of groceries.    Time 4   Period Weeks           OT Long Term Goals - 08/27/14 1022    OT LONG TERM GOAL #1   Title Patient will be able to fasten his own pants and hold his new grandbaby safely, as well as complete all BADLs with highest level of independence possible.   Status On-going   OT LONG TERM GOAL #2   Title Patient will improve bilateral grip strength by 15 pounds and pinch strength by 4 pounds for increased abiilty to maintain grasp on objects.   Status On-going   OT LONG TERM GOAL #3   Title Patient will improve fine motor coordination by decreasing completion time on the Nine Hole Peg Test by 10 seconds for increased ability to manipulate zippers and buttons.   Status On-going               Plan - 09/23/14 1059    Clinical Impression Statement A:  Treatment focused on bilateral hand strengthening and motor control of fine motor movements.  last portion of meeting was focused on strengthening movements needed to fasten buttons on pants while pulling pants together.   Plan P:  button fastening, pinch tree for shoulder range and pinch strengthening, mini reassessment.        Problem List Patient Active Problem List   Diagnosis Date Noted  . Varicose veins of leg with complications 48/27/0786  . Rectal pain 10/10/2013  . Rectal bleeding 10/10/2013  . Unspecified constipation 10/10/2013  . Fecal incontinence 10/10/2013  . Heme positive stool 10/10/2013  . OSA on CPAP 08/25/2013  . HIV exposure 07/29/2013  . Encounter for screening colonoscopy 10/10/2012  . Hemorrhoids 10/10/2012    Vangie Bicker, OTR/L [phone]  09/23/2014, 11:03 AM  [facility] [address] [place], [place], [address] Phone: [phone]   Fax:  [phone]

## 2014-09-24 NOTE — Telephone Encounter (Signed)
Tried to do a PA for Amitiza again. It was denied. They are going to need an appeal letter written and sent in.

## 2014-09-25 NOTE — Telephone Encounter (Signed)
Yesterday I received a fax from [facility] saying Baker Pierini was denied. This morning I received a fax from [facility] saying it was approved. I am sending this to the pharmacy and will contact pt.

## 2014-09-25 NOTE — Telephone Encounter (Signed)
Pt is aware.  

## 2014-09-25 NOTE — Telephone Encounter (Signed)
Noted  

## 2014-10-05 ENCOUNTER — Ambulatory Visit (HOSPITAL_COMMUNITY): Payer: 59 | Admitting: Specialist

## 2014-10-05 DIAGNOSIS — R279 Unspecified lack of coordination: Secondary | ICD-10-CM

## 2014-10-05 DIAGNOSIS — M62542 Muscle wasting and atrophy, not elsewhere classified, left hand: Secondary | ICD-10-CM | POA: Diagnosis not present

## 2014-10-05 DIAGNOSIS — M6281 Muscle weakness (generalized): Secondary | ICD-10-CM

## 2014-10-05 DIAGNOSIS — M25519 Pain in unspecified shoulder: Secondary | ICD-10-CM

## 2014-10-05 DIAGNOSIS — M542 Cervicalgia: Secondary | ICD-10-CM

## 2014-10-05 NOTE — Therapy (Signed)
[place], [place], [address] Phone: [phone]   Fax:  [phone]  Occupational Therapy Treatment  Patient Details  Name: Jerome [name] MRN: [id number] Date of Birth: 1960-10-23 Referring Provider:  Sharilyn Sites, MD  Encounter Date: 10/05/2014      OT End of Session - 10/05/14 1025    Visit Number 16   Number of Visits 26   Date for OT Re-Evaluation 10/24/14   OT Start Time 0938   OT Stop Time 1019   OT Time Calculation (min) 41 min   Activity Tolerance Patient tolerated treatment well   Behavior During Therapy [facility] for tasks assessed/performed      Past Medical History  Diagnosis Date  . DJD (degenerative joint disease)   . Spinal stenosis   . Hypertension   . Obesity   . Thrombocytopenia     Past Surgical History  Procedure Laterality Date  . Laparoscopic gastric sleeve resection  06/2012    Dr Mickie Bail [facility])  . Breast reduction surgery    . Back surgery      x 4  . Colonoscopy N/A 10/14/2012    [id number] rectum and colon  . Flexible sigmoidoscopy N/A 10/15/2013    Procedure: FLEXIBLE SIGMOIDOSCOPY;  Surgeon: Daneil Dolin, MD;  [facility];  Service: Endoscopy;  Laterality: N/A;  8:30    There were no vitals filed for this visit.  Visit Diagnosis:  Hand muscle weakness  Pain in joint, shoulder region, unspecified laterality  Neck pain, bilateral  Lack of coordination      Subjective Assessment - 10/05/14 0939    Symptoms S:  I fell into a doorway and landed on the floor yesterday.    Currently in Pain? Yes   Pain Score 7    Pain Location Head   Pain Descriptors / Indicators Aching   Pain Type Chronic pain            [facility] OT Assessment - 10/05/14 0001    Assessment   Diagnosis Bilateral Hand Atrophy   Precautions   Precautions Cervical   Precaution Comments has had several neck surgeries - avoid manual cervical traction and MFR near the spinal column   Sensation   Semmes Weinstein Monofilament Scale Unresponsive  bilateral hands   Coordination   Right 9 Hole Peg Test 26.16"   Left 9 Hole Peg Test 29.31"   Other able to oppose to little finger with bilateral thumbs   Strength   Grip (lbs) 60   Right Hand Lateral Pinch 14 lbs   Right Hand 3 Point Pinch 8 lbs   Grip (lbs) 18   Left Hand Lateral Pinch 6.5 lbs   Left Hand 3 Point Pinch 6 lbs                OT Treatments/Exercises (OP) - 10/05/14 0001    Manual Therapy   Manual Therapy Myofascial release   Myofascial Release MFR to bilateral scapular regions, upper arm, shoulder region, and sternocleidomastoid region to decrease pain and fascial restrictions and improve pain free mobility in bilateral upper extremity and cervical region                OT Education - 10/05/14 1023    Education provided Yes   Education Details recommended focusing on grip and coordination exercises previously given   Person(s) Educated Patient   Methods Explanation;Handout;Demonstration   Comprehension Verbalized understanding  OT Short Term Goals - 09/14/14 1550    OT SHORT TERM GOAL #1   Title Patient will be educated on a HEP.   Status Achieved   OT SHORT TERM GOAL #2   Title Patient will improve bilateral grip strength by 10 pounds and pinch strength by 2 pounds for increased abiilty to maintain grasp on objects.   Status Partially Met   OT SHORT TERM GOAL #3   Title Patient will improve fine motor coordination by decreasing completion time on the Nine Hole Peg Test by 5 seconds for increased ability to manipulate zippers and buttons.   Status Partially Met   OT SHORT TERM GOAL #4   Title Patient will improve sensation in right hand to diminished protective sensation and left hand to loss of protective sensation.   Status Partially Met   OT SHORT TERM GOAL #5   Title Patient will improve bilateral shoulder and cervical AROM by50% for improved ability to reach  overhead and put items into closet.   Time 4   Period Weeks   Status New   Additional Short Term Goals   Additional Short Term Goals Yes   OT SHORT TERM GOAL #6   Title Patient will improve bilateral shoulder strength to 4+/5 or better for increased ability to lift bags of groceries.    Time 4   Period Weeks           OT Long Term Goals - 08/27/14 1022    OT LONG TERM GOAL #1   Title Patient will be able to fasten his own pants and hold his new grandbaby safely, as well as complete all BADLs with highest level of independence possible.   Status On-going   OT LONG TERM GOAL #2   Title Patient will improve bilateral grip strength by 15 pounds and pinch strength by 4 pounds for increased abiilty to maintain grasp on objects.   Status On-going   OT LONG TERM GOAL #3   Title Patient will improve fine motor coordination by decreasing completion time on the Nine Hole Peg Test by 10 seconds for increased ability to manipulate zippers and buttons.   Status On-going               Plan - 10/05/14 1026    Clinical Impression Statement A:  Reasessed hand strength, coordination, and sensation this date.  Lateral pinch and 3 point pinch have improved with significant improvements in left thumb dexteriity.  Grip strength in bilateral hands has decreased, as has completion time on nine hole peg test.  Patients light touch sensation has decreased in bilateral hands, and patient notes dropping items more frequently.  Therapist recommended he follow up iwth his pain management/spine MD at [facility] regarding these changes.     Plan P:  Improve bilateral grip and coordination for improved ability to maintain grip on items.          Problem List Patient Active Problem List   Diagnosis Date Noted  . Varicose veins of leg with complications 01/21/1600  . Rectal pain 10/10/2013  . Rectal bleeding 10/10/2013  . Unspecified constipation 10/10/2013  . Fecal incontinence 10/10/2013  . Heme positive  stool 10/10/2013  . OSA on CPAP 08/25/2013  . HIV exposure 07/29/2013  . Encounter for screening colonoscopy 10/10/2012  . Hemorrhoids 10/10/2012    Vangie Bicker, OTR/L [phone]  10/05/2014, 10:34 AM  [place] [address] [place], [place], [address] Phone: [phone]  Fax:  [phone]

## 2014-10-07 ENCOUNTER — Ambulatory Visit (HOSPITAL_COMMUNITY): Payer: 59

## 2014-10-07 ENCOUNTER — Encounter (HOSPITAL_COMMUNITY): Payer: Self-pay

## 2014-10-07 DIAGNOSIS — M62542 Muscle wasting and atrophy, not elsewhere classified, left hand: Secondary | ICD-10-CM | POA: Diagnosis not present

## 2014-10-07 DIAGNOSIS — M542 Cervicalgia: Secondary | ICD-10-CM

## 2014-10-07 DIAGNOSIS — M25519 Pain in unspecified shoulder: Secondary | ICD-10-CM

## 2014-10-07 DIAGNOSIS — M6281 Muscle weakness (generalized): Secondary | ICD-10-CM

## 2014-10-07 NOTE — Therapy (Signed)
[place], [place], [address] Phone: [phone]   Fax:  [phone]  Occupational Therapy Treatment  Patient Details  Name: Jerome [name] MRN: [id number] Date of Birth: [birth year] Referring Provider:  Sharilyn Sites, MD  Encounter Date: 10/07/2014      OT End of Session - 10/07/14 0928    Visit Number 17   Number of Visits 26   Date for OT Re-Evaluation 10/24/14   Authorization Type n/a   OT Start Time 0849   OT Stop Time 0930   OT Time Calculation (min) 41 min   Activity Tolerance Patient tolerated treatment well   Behavior During Therapy [facility] for tasks assessed/performed      Past Medical History  Diagnosis Date  . DJD (degenerative joint disease)   . Spinal stenosis   . Hypertension   . Obesity   . Thrombocytopenia     Past Surgical History  Procedure Laterality Date  . Laparoscopic gastric sleeve resection  06/2012    Dr Mickie Bail [facility])  . Breast reduction surgery    . Back surgery      x 4  . Colonoscopy N/A 10/14/2012    [id number] rectum and colon  . Flexible sigmoidoscopy N/A 10/15/2013    Procedure: FLEXIBLE SIGMOIDOSCOPY;  Surgeon: Daneil Dolin, MD;  [facility];  Service: Endoscopy;  Laterality: N/A;  8:30    There were no vitals filed for this visit.  Visit Diagnosis:  Pain in joint, shoulder region, unspecified laterality  Hand muscle weakness  Neck pain, bilateral      Subjective Assessment - 10/07/14 0912    Symptoms S: I had a shot a few weeks ago in my shoulder/neck. It helped with the pain for a few days.    Currently in Pain? Yes   Pain Score 5    Pain Location Head   Pain Descriptors / Indicators Aching            [facility] OT Assessment - 10/07/14 0915    Assessment   Diagnosis Bilateral Hand Atrophy   Precautions   Precautions Cervical   Precaution Comments has had several neck surgeries - avoid manual cervical traction and MFR near the spinal  column                OT Treatments/Exercises (OP) - 10/07/14 0915    Exercises   Exercises Shoulder;Hand;Wrist   Additional Wrist Exercises   Theraputty - Flatten green- seated   Theraputty - Roll green using bilateral hands   Theraputty - Grip green supinated and pronated   Theraputty - Pinch green supinated and pronated   Hand Exercises   Other Hand Exercises Green eggsizer thumb adduction 10X   Manual Therapy   Manual Therapy Myofascial release   Myofascial Release Myofascial release to bilateral scapular regions, upper arm, shoulder region, and sternocleidomastoid region to decrease pain and fascial restrictions and improve pain free mobility in bilateral upper extremity and cervical region. Trigger point release performed to medial boarder of right scapula using small  tennis ball.                   OT Short Term Goals - 10/07/14 0936    OT SHORT TERM GOAL #1   Title Patient will be educated on a HEP.   OT SHORT TERM GOAL #2   Title Patient will improve bilateral grip strength by 10 pounds and pinch strength by 2 pounds for increased abiilty  to maintain grasp on objects.   Status Partially Met   OT SHORT TERM GOAL #3   Title Patient will improve fine motor coordination by decreasing completion time on the Nine Hole Peg Test by 5 seconds for increased ability to manipulate zippers and buttons.   Status Partially Met   OT SHORT TERM GOAL #4   Title Patient will improve sensation in right hand to diminished protective sensation and left hand to loss of protective sensation.   Status Partially Met   OT SHORT TERM GOAL #5   Title Patient will improve bilateral shoulder and cervical AROM by50% for improved ability to reach overhead and put items into closet.   Status On-going   OT SHORT TERM GOAL #6   Title Patient will improve bilateral shoulder strength to 4+/5 or better for increased ability to lift bags of groceries.    Status On-going           OT  Long Term Goals - 10/07/14 3403    OT LONG TERM GOAL #1   Title Patient will be able to fasten his own pants and hold his new grandbaby safely, as well as complete all BADLs with highest level of independence possible.   Status On-going   OT LONG TERM GOAL #2   Title Patient will improve bilateral grip strength by 15 pounds and pinch strength by 4 pounds for increased abiilty to maintain grasp on objects.   Status On-going   OT LONG TERM GOAL #3   Title Patient will improve fine motor coordination by decreasing completion time on the Nine Hole Peg Test by 10 seconds for increased ability to manipulate zippers and buttons.   Status On-going               Plan - 10/07/14 0935    Clinical Impression Statement A: Started session myofascial release to bilateral shoulder and lateral portions of neck. Pt with increased muscle tightness in bilateral upper trapezius.    Plan P: Continue to work on increasing grip strength and coordination of bilateral hands.         Problem List Patient Active Problem List   Diagnosis Date Noted  . Varicose veins of leg with complications 70/96/4383  . Rectal pain 10/10/2013  . Rectal bleeding 10/10/2013  . Unspecified constipation 10/10/2013  . Fecal incontinence 10/10/2013  . Heme positive stool 10/10/2013  . OSA on CPAP 08/25/2013  . HIV exposure 07/29/2013  . Encounter for screening colonoscopy 10/10/2012  . Hemorrhoids 10/10/2012    Ailene Ravel, OTR/L,CBIS  [phone]  10/07/2014, 9:38 AM  [place], [place], [address] Phone: [phone]   Fax:  [phone]

## 2014-10-12 ENCOUNTER — Ambulatory Visit (HOSPITAL_COMMUNITY): Payer: 59

## 2014-10-13 ENCOUNTER — Encounter (HOSPITAL_COMMUNITY): Payer: Self-pay | Admitting: Occupational Therapy

## 2014-10-13 ENCOUNTER — Ambulatory Visit (HOSPITAL_COMMUNITY): Payer: 59 | Admitting: Occupational Therapy

## 2014-10-13 DIAGNOSIS — M6281 Muscle weakness (generalized): Secondary | ICD-10-CM

## 2014-10-13 DIAGNOSIS — R279 Unspecified lack of coordination: Secondary | ICD-10-CM

## 2014-10-13 DIAGNOSIS — M62542 Muscle wasting and atrophy, not elsewhere classified, left hand: Secondary | ICD-10-CM | POA: Diagnosis not present

## 2014-10-13 DIAGNOSIS — M25519 Pain in unspecified shoulder: Secondary | ICD-10-CM

## 2014-10-13 NOTE — Therapy (Signed)
[place], [place], [address] Phone: [phone]   Fax:  [phone]  Occupational Therapy Treatment  Patient Details  Name: Jerome [name] MRN: [id number] Date of Birth: 1960/10/05 Referring Provider:  Sharilyn Sites, MD  Encounter Date: 10/13/2014      OT End of Session - 10/13/14 1433    Visit Number 18   Number of Visits 26   Date for OT Re-Evaluation 10/24/14   Authorization Type n/a   OT Start Time 1347   OT Stop Time 1430   OT Time Calculation (min) 43 min   Activity Tolerance Patient tolerated treatment well   Behavior During Therapy [facility] for tasks assessed/performed      Past Medical History  Diagnosis Date  . DJD (degenerative joint disease)   . Spinal stenosis   . Hypertension   . Obesity   . Thrombocytopenia     Past Surgical History  Procedure Laterality Date  . Laparoscopic gastric sleeve resection  06/2012    Dr Mickie Bail [facility])  . Breast reduction surgery    . Back surgery      x 4  . Colonoscopy N/A 10/14/2012    [id number] rectum and colon  . Flexible sigmoidoscopy N/A 10/15/2013    Procedure: FLEXIBLE SIGMOIDOSCOPY;  Surgeon: Daneil Dolin, MD;  [facility];  Service: Endoscopy;  Laterality: N/A;  8:30    There were no vitals filed for this visit.  Visit Diagnosis:  Pain in joint, shoulder region, unspecified laterality  Hand muscle weakness  Lack of coordination      Subjective Assessment - 10/13/14 1431    Symptoms S: I fell this morning and am sore now.    Currently in Pain? Yes   Pain Score 6    Pain Location Ankle   Pain Orientation Right   Pain Descriptors / Indicators Aching   Pain Type Acute pain            [facility] OT Assessment - 10/13/14 1433    Assessment   Diagnosis Bilateral Hand Atrophy   Precautions   Precautions Cervical   Precaution Comments has had several neck surgeries - avoid manual cervical traction and MFR near the spinal  column                OT Treatments/Exercises (OP) - 10/13/14 1348    Exercises   Exercises Shoulder;Hand;Wrist   Wrist Exercises   Forearm Supination Strengthening;10 reps;Both   Bar Weights/Barbell (Forearm Supination) 1 lb   Forearm Pronation Strengthening;Both;10 reps   Bar Weights/Barbell (Forearm Pronation) 1 lb   Wrist Flexion Strengthening;Both;10 reps   Bar Weights/Barbell (Wrist Flexion) 1 lb   Wrist Extension Strengthening;10 reps;Both   Bar Weights/Barbell (Wrist Extension) 1 lb   Wrist Radial Deviation Strengthening;Both;10 reps   Bar Weights/Barbell (Radial Deviation) 1 lb   Additional Wrist Exercises   Theraputty - Flatten green- seated   Theraputty - Roll green using bilateral hands   Theraputty - Grip green supinated and pronated   Theraputty - Pinch green supinated and pronated   Hand Gripper with Large Beads  L: 23# 6 beads   Hand Gripper with Medium Beads  L: 23# 13 beads   Hand Gripper with Small Beads L: 20# 12 beads   Manual Therapy   Manual Therapy Myofascial release   Myofascial Release Myofascial release to bilateral scapular regions, upper arm, shoulder region, and sternocleidomastoid region to decrease pain and fascial restrictions and improve pain  free mobility in bilateral upper extremity and cervical region. Trigger point release performed to medial boarder of right scapula using small  tennis ball.                   OT Short Term Goals - 10/07/14 0936    OT SHORT TERM GOAL #1   Title Patient will be educated on a HEP.   OT SHORT TERM GOAL #2   Title Patient will improve bilateral grip strength by 10 pounds and pinch strength by 2 pounds for increased abiilty to maintain grasp on objects.   Status Partially Met   OT SHORT TERM GOAL #3   Title Patient will improve fine motor coordination by decreasing completion time on the Nine Hole Peg Test by 5 seconds for increased ability to manipulate zippers and buttons.   Status Partially  Met   OT SHORT TERM GOAL #4   Title Patient will improve sensation in right hand to diminished protective sensation and left hand to loss of protective sensation.   Status Partially Met   OT SHORT TERM GOAL #5   Title Patient will improve bilateral shoulder and cervical AROM by50% for improved ability to reach overhead and put items into closet.   Status On-going   OT SHORT TERM GOAL #6   Title Patient will improve bilateral shoulder strength to 4+/5 or better for increased ability to lift bags of groceries.    Status On-going           OT Long Term Goals - 10/07/14 2440    OT LONG TERM GOAL #1   Title Patient will be able to fasten his own pants and hold his new grandbaby safely, as well as complete all BADLs with highest level of independence possible.   Status On-going   OT LONG TERM GOAL #2   Title Patient will improve bilateral grip strength by 15 pounds and pinch strength by 4 pounds for increased abiilty to maintain grasp on objects.   Status On-going   OT LONG TERM GOAL #3   Title Patient will improve fine motor coordination by decreasing completion time on the Nine Hole Peg Test by 10 seconds for increased ability to manipulate zippers and buttons.   Status On-going               Plan - 10/13/14 1433    Clinical Impression Statement A: Began session with grip strengthening exercises. Continued myofascial release to bilateral shoulder, trapezius, and lateral portions of neck. Pt with increased tightness along left upper trapezius. Pt reports falling this morning and is having increased soreness in ankle and leg due to fall.    Plan P: Bilateral coordination, grip strengthening, myofascial release as needed         Problem List Patient Active Problem List   Diagnosis Date Noted  . Varicose veins of leg with complications 05/20/2535  . Rectal pain 10/10/2013  . Rectal bleeding 10/10/2013  . Unspecified constipation 10/10/2013  . Fecal incontinence 10/10/2013   . Heme positive stool 10/10/2013  . OSA on CPAP 08/25/2013  . HIV exposure 07/29/2013  . Encounter for screening colonoscopy 10/10/2012  . Hemorrhoids 10/10/2012    Guadelupe Sabin, OTR/L  [phone]  10/13/2014, 2:37 PM  [place] [address] [place], [place], [address] Phone: [phone]   Fax:  [phone]

## 2014-10-16 ENCOUNTER — Ambulatory Visit (HOSPITAL_COMMUNITY): Payer: 59

## 2014-10-16 ENCOUNTER — Encounter (HOSPITAL_COMMUNITY): Payer: Self-pay

## 2014-10-16 DIAGNOSIS — M6281 Muscle weakness (generalized): Secondary | ICD-10-CM

## 2014-10-16 DIAGNOSIS — R279 Unspecified lack of coordination: Secondary | ICD-10-CM

## 2014-10-16 DIAGNOSIS — M542 Cervicalgia: Secondary | ICD-10-CM

## 2014-10-16 DIAGNOSIS — M25519 Pain in unspecified shoulder: Secondary | ICD-10-CM

## 2014-10-16 DIAGNOSIS — M62542 Muscle wasting and atrophy, not elsewhere classified, left hand: Secondary | ICD-10-CM | POA: Diagnosis not present

## 2014-10-16 NOTE — Therapy (Addendum)
[facility] [address] [place], [place], [address] Phone: [phone]   Fax:  [phone]  Occupational Therapy Treatment  Patient Details  Name: Jerome [name] MRN: [id number] Date of Birth: 08/10/1960 Referring Provider:  Golding, John, MD  Encounter Date: 10/16/2014      OT End of Session - 10/16/14 1003    Visit Number 19   Number of Visits 26   Date for OT Re-Evaluation 10/24/14   Authorization Type [employer]    Authorization Time Period 23 visits approved per year   Authorization - Visit Number 19   Authorization - Number of Visits 23   OT Start Time 0846   OT Stop Time 0930   OT Time Calculation (min) 44 min   Activity Tolerance Patient tolerated treatment well   Behavior During Therapy [facility] for tasks assessed/performed      Past Medical History  Diagnosis Date  . DJD (degenerative joint disease)   . Spinal stenosis   . Hypertension   . Obesity   . Thrombocytopenia     Past Surgical History  Procedure Laterality Date  . Laparoscopic gastric sleeve resection  06/2012    Dr [place] ([facility])  . Breast reduction surgery    . Back surgery      x 4  . Colonoscopy N/A 10/14/2012    RMR:Normal rectum and colon  . Flexible sigmoidoscopy N/A 10/15/2013    Procedure: FLEXIBLE SIGMOIDOSCOPY;  Surgeon: Francisco M Rourk, MD;  [facility];  Service: Endoscopy;  Laterality: N/A;  8:30    There were no vitals filed for this visit.  Visit Diagnosis:  Hand muscle weakness  Pain in joint, shoulder region, unspecified laterality  Neck pain, bilateral  Lack of coordination      Subjective Assessment - 10/16/14 0928    Symptoms S: My right hand isn't that good today.   Currently in Pain? Yes   Pain Score 8    Pain Location Neck   Pain Orientation Posterior   Pain Descriptors / Indicators Aching   Pain Type Chronic pain            [facility] OT Assessment - 10/16/14 0912    Assessment   Diagnosis Bilateral Hand  Atrophy   Precautions   Precautions Cervical   Precaution Comments has had several neck surgeries - avoid manual cervical traction and MFR near the spinal column                OT Treatments/Exercises (OP) - 10/16/14 0850    Exercises   Exercises Shoulder;Hand;Wrist   Wrist Exercises   Forearm Supination Strengthening;Both  12X   Bar Weights/Barbell (Forearm Supination) 2 lbs   Forearm Pronation Strengthening;Both  12X   Bar Weights/Barbell (Forearm Pronation) 2 lbs   Wrist Flexion Strengthening;Both  12X   Bar Weights/Barbell (Wrist Flexion) 2 lbs   Wrist Extension Strengthening;Both  12X   Bar Weights/Barbell (Wrist Extension) 2 lbs   Wrist Radial Deviation Strengthening;Both  12X   Bar Weights/Barbell (Radial Deviation) 2 lbs   Hand Exercises   Other Hand Exercises Coordination task used grooved pegboard and twizzers to place/remove pegs. Alternated between bilateral hands to complete. Mod difficulty with increased time to complete.    Manual Therapy   Manual Therapy Myofascial release   Myofascial Release Myofascial release to bilateral scapular regions, upper arm, shoulder region, and sternocleidomastoid region to decrease pain and fascial restrictions and improve pain free mobility in bilateral upper extremity and cervical   region.                    OT Short Term Goals - 10/07/14 0936    OT SHORT TERM GOAL #1   Title Patient will be educated on a HEP.   OT SHORT TERM GOAL #2   Title Patient will improve bilateral grip strength by 10 pounds and pinch strength by 2 pounds for increased abiilty to maintain grasp on objects.   Status Partially Met   OT SHORT TERM GOAL #3   Title Patient will improve fine motor coordination by decreasing completion time on the Nine Hole Peg Test by 5 seconds for increased ability to manipulate zippers and buttons.   Status Partially Met   OT SHORT TERM GOAL #4   Title Patient will improve sensation in right hand to  diminished protective sensation and left hand to loss of protective sensation.   Status Partially Met   OT SHORT TERM GOAL #5   Title Patient will improve bilateral shoulder and cervical AROM by50% for improved ability to reach overhead and put items into closet.   Status On-going   OT SHORT TERM GOAL #6   Title Patient will improve bilateral shoulder strength to 4+/5 or better for increased ability to lift bags of groceries.    Status On-going           OT Long Term Goals - 10/07/14 0937    OT LONG TERM GOAL #1   Title Patient will be able to fasten his own pants and hold his new grandbaby safely, as well as complete all BADLs with highest level of independence possible.   Status On-going   OT LONG TERM GOAL #2   Title Patient will improve bilateral grip strength by 15 pounds and pinch strength by 4 pounds for increased abiilty to maintain grasp on objects.   Status On-going   OT LONG TERM GOAL #3   Title Patient will improve fine motor coordination by decreasing completion time on the Nine Hole Peg Test by 10 seconds for increased ability to manipulate zippers and buttons.   Status On-going               Plan - 10/16/14 1005    Clinical Impression Statement A: Increased muscle knots and tightness in bilateral upper trapezius region.   Plan P: Cont with bilateral coordination and grip strengthening.        Problem List Patient Active Problem List   Diagnosis Date Noted  . Varicose veins of leg with complications 05/04/2014  . Rectal pain 10/10/2013  . Rectal bleeding 10/10/2013  . Unspecified constipation 10/10/2013  . Fecal incontinence 10/10/2013  . Heme positive stool 10/10/2013  . OSA on CPAP 08/25/2013  . HIV exposure 07/29/2013  . Encounter for screening colonoscopy 10/10/2012  . Hemorrhoids 10/10/2012    Laura Essenmacher, OTR/L,CBIS  [phone]  10/16/2014, 10:09 AM  [place] [facility] [address] [place], [place], [address] Phone: [phone]   Fax:  [phone]    

## 2014-10-17 ENCOUNTER — Other Ambulatory Visit: Payer: Self-pay | Admitting: Gastroenterology

## 2014-10-19 ENCOUNTER — Ambulatory Visit (HOSPITAL_COMMUNITY): Payer: 59

## 2014-10-19 DIAGNOSIS — R279 Unspecified lack of coordination: Secondary | ICD-10-CM

## 2014-10-19 DIAGNOSIS — M25519 Pain in unspecified shoulder: Secondary | ICD-10-CM

## 2014-10-19 DIAGNOSIS — M542 Cervicalgia: Secondary | ICD-10-CM

## 2014-10-19 DIAGNOSIS — M62542 Muscle wasting and atrophy, not elsewhere classified, left hand: Secondary | ICD-10-CM | POA: Diagnosis not present

## 2014-10-19 DIAGNOSIS — M6281 Muscle weakness (generalized): Secondary | ICD-10-CM

## 2014-10-19 NOTE — Therapy (Signed)
[place], [place], [address] Phone: [phone]   Fax:  [phone]  Occupational Therapy Treatment  Patient Details  Name: Jerome [name] MRN: [id number] Date of Birth: [birth year] Referring Provider:  Redmond School, MD  Encounter Date: 10/19/2014      OT End of Session - 10/19/14 1009    Visit Number 20   Number of Visits 26   Date for OT Re-Evaluation 10/24/14   Authorization Type U[employer]    Authorization Time Period 23 visits approved per year   Authorization - Visit Number 20   Authorization - Number of Visits 23   OT Start Time [phone]   OT Stop Time 1012   OT Time Calculation (min) 46 min   Activity Tolerance Patient tolerated treatment well   Behavior During Therapy [facility] for tasks assessed/performed      Past Medical History  Diagnosis Date  . DJD (degenerative joint disease)   . Spinal stenosis   . Hypertension   . Obesity   . Thrombocytopenia     Past Surgical History  Procedure Laterality Date  . Laparoscopic gastric sleeve resection  06/2012    Dr Mickie Bail [facility])  . Breast reduction surgery    . Back surgery      x 4  . Colonoscopy N/A 10/14/2012    [id number] rectum and colon  . Flexible sigmoidoscopy N/A 10/15/2013    Procedure: FLEXIBLE SIGMOIDOSCOPY;  Surgeon: Daneil Dolin, MD;  [facility];  Service: Endoscopy;  Laterality: N/A;  8:30    There were no vitals filed for this visit.  Visit Diagnosis:  Neck pain, bilateral  Pain in joint, shoulder region, unspecified laterality  Lack of coordination  Hand muscle weakness                  OT Treatments/Exercises (OP) - 10/19/14 0949    Exercises   Exercises Shoulder;Hand;Wrist   Wrist Exercises   Forearm Supination Strengthening;Both;15 reps   Bar Weights/Barbell (Forearm Supination) 2 lbs   Forearm Pronation Strengthening;Both;15 reps   Bar Weights/Barbell (Forearm Pronation) 2 lbs   Wrist Flexion  Strengthening;Both;15 reps   Bar Weights/Barbell (Wrist Flexion) 2 lbs   Wrist Extension Strengthening;Both;15 reps   Bar Weights/Barbell (Wrist Extension) 2 lbs   Wrist Radial Deviation Strengthening;Both;15 reps   Bar Weights/Barbell (Radial Deviation) 2 lbs   Additional Wrist Exercises   Hand Gripper with Large Beads  L: 23# 6 beads   Hand Gripper with Medium Beads  L: 23# 13 beads   Hand Gripper with Small Beads L: 23# 6/12 beads   Hand Exercises   Other Hand Exercises Coordination task compelted using colored pegboard pattern and tweezers. Pegs place with Lt hand and removed with Rt hand. When removed focus was on speed.   Manual Therapy   Manual Therapy Myofascial release   Myofascial Release Myofascial release to bilateral scapular regions, upper arm, shoulder region, and sternocleidomastoid region to decrease pain and fascial restrictions and improve pain free mobility in bilateral upper extremity and cervical region.                    OT Short Term Goals - 10/07/14 0936    OT SHORT TERM GOAL #1   Title Patient will be educated on a HEP.   OT SHORT TERM GOAL #2   Title Patient will improve bilateral grip strength by 10 pounds and pinch strength by 2 pounds for increased abiilty  to maintain grasp on objects.   Status Partially Met   OT SHORT TERM GOAL #3   Title Patient will improve fine motor coordination by decreasing completion time on the Nine Hole Peg Test by 5 seconds for increased ability to manipulate zippers and buttons.   Status Partially Met   OT SHORT TERM GOAL #4   Title Patient will improve sensation in right hand to diminished protective sensation and left hand to loss of protective sensation.   Status Partially Met   OT SHORT TERM GOAL #5   Title Patient will improve bilateral shoulder and cervical AROM by50% for improved ability to reach overhead and put items into closet.   Status On-going   OT SHORT TERM GOAL #6   Title Patient will improve  bilateral shoulder strength to 4+/5 or better for increased ability to lift bags of groceries.    Status On-going           OT Long Term Goals - 10/07/14 7371    OT LONG TERM GOAL #1   Title Patient will be able to fasten his own pants and hold his new grandbaby safely, as well as complete all BADLs with highest level of independence possible.   Status On-going   OT LONG TERM GOAL #2   Title Patient will improve bilateral grip strength by 15 pounds and pinch strength by 4 pounds for increased abiilty to maintain grasp on objects.   Status On-going   OT LONG TERM GOAL #3   Title Patient will improve fine motor coordination by decreasing completion time on the Nine Hole Peg Test by 10 seconds for increased ability to manipulate zippers and buttons.   Status On-going               Plan - 10/19/14 1003    Clinical Impression Statement A: Dicussed that insurance only allows 23 visits per year per discipline. Today is visit 20 out of 23. Pt will use remaining visits and discharge on visit 23. Pt in agreement with plan. Informed patient that if he needs PT for any issues he will have 23 visits to use with them also. Increased swelling on left lateral portion of neck. Increased trigger points located on bilateral upper trapezius muscles with right>left. Discussed using cane and/or tennis to perform self myofascial release and trigger point release on upper trapezius muscles.    Plan MFR to bilateral shoulders and cervical region. Cont with coordination and grip strengthening.         Problem List Patient Active Problem List   Diagnosis Date Noted  . Varicose veins of leg with complications 01/17/9484  . Rectal pain 10/10/2013  . Rectal bleeding 10/10/2013  . Unspecified constipation 10/10/2013  . Fecal incontinence 10/10/2013  . Heme positive stool 10/10/2013  . OSA on CPAP 08/25/2013  . HIV exposure 07/29/2013  . Encounter for screening colonoscopy 10/10/2012  . Hemorrhoids  10/10/2012    Ailene Ravel, OTR/L,CBIS  [phone]  10/19/2014, 10:16 AM  [place], [place], [address] Phone: [phone]   Fax:  [phone]

## 2014-10-21 ENCOUNTER — Encounter (HOSPITAL_COMMUNITY): Payer: Self-pay

## 2014-10-21 ENCOUNTER — Ambulatory Visit (HOSPITAL_COMMUNITY): Payer: 59

## 2014-10-21 DIAGNOSIS — M62542 Muscle wasting and atrophy, not elsewhere classified, left hand: Secondary | ICD-10-CM | POA: Diagnosis not present

## 2014-10-21 DIAGNOSIS — M25519 Pain in unspecified shoulder: Secondary | ICD-10-CM

## 2014-10-21 DIAGNOSIS — M542 Cervicalgia: Secondary | ICD-10-CM
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
## 2014-10-26 ENCOUNTER — Ambulatory Visit (HOSPITAL_COMMUNITY): Payer: 59 | Attending: Family Medicine

## 2014-10-26 ENCOUNTER — Encounter (HOSPITAL_COMMUNITY): Payer: Self-pay

## 2014-10-26 DIAGNOSIS — M62542 Muscle wasting and atrophy, not elsewhere classified, left hand: Secondary | ICD-10-CM | POA: Diagnosis not present

## 2014-10-26 DIAGNOSIS — M542 Cervicalgia: Secondary | ICD-10-CM

## 2014-10-26 DIAGNOSIS — M25519 Pain in unspecified shoulder: Secondary | ICD-10-CM

## 2014-10-26 DIAGNOSIS — M25619 Stiffness of unspecified shoulder, not elsewhere classified: Secondary | ICD-10-CM

## 2014-10-26 DIAGNOSIS — R278 Other lack of coordination: Secondary | ICD-10-CM | POA: Insufficient documentation

## 2014-10-26 DIAGNOSIS — R200 Anesthesia of skin: Secondary | ICD-10-CM | POA: Diagnosis not present

## 2014-10-26 NOTE — Therapy (Signed)
[place], [place], [address] Phone: [phone]   Fax:  [phone]  Occupational Therapy Treatment  Patient Details  Name: Jerome [name] MRN: [id number] Date of Birth: 12-Jul-1961 Referring Provider:  Sharilyn Sites, MD  Encounter Date: 10/26/2014      OT End of Session - 10/26/14 1107    Visit Number 22   Number of Visits 23   Date for OT Re-Evaluation 10/24/14   Authorization Type UHC    Authorization Time Period 23 visits approved per year   Authorization - Visit Number 22   Authorization - Number of Visits 23   OT Start Time 0931   OT Stop Time 1015   OT Time Calculation (min) 44 min   Activity Tolerance Patient tolerated treatment well   Behavior During Therapy [facility] for tasks assessed/performed      Past Medical History  Diagnosis Date  . DJD (degenerative joint disease)   . Spinal stenosis   . Hypertension   . Obesity   . Thrombocytopenia     Past Surgical History  Procedure Laterality Date  . Laparoscopic gastric sleeve resection  06/2012    Dr Mickie Bail [facility])  . Breast reduction surgery    . Back surgery      x 4  . Colonoscopy N/A 10/14/2012    [id number] rectum and colon  . Flexible sigmoidoscopy N/A 10/15/2013    Procedure: FLEXIBLE SIGMOIDOSCOPY;  Surgeon: Daneil Dolin, MD;  [facility];  Service: Endoscopy;  Laterality: N/A;  8:30    There were no vitals filed for this visit.  Visit Diagnosis:  Neck pain, bilateral  Pain in joint, shoulder region, unspecified laterality  Decreased shoulder mobility, unspecified laterality      Subjective Assessment - 10/26/14 0954    Subjective  S: I think I'm having more of a nerve issue going on. There's a spot on the right shoulder blade. It just feels like I'm being compressed.    Currently in Pain? Yes   Pain Score 6    Pain Location Shoulder   Pain Orientation Right   Pain Descriptors / Indicators Aching   Pain Type  Chronic pain            [facility] OT Assessment - 10/26/14 0952    Assessment   Diagnosis Bilateral Hand Atrophy   Precautions   Precautions Cervical   Precaution Comments has had several neck surgeries - avoid manual cervical traction and MFR near the spinal column                OT Treatments/Exercises (OP) - 10/26/14 4825    Exercises   Exercises Neck;Shoulder;Hand   Neck Exercises: Seated   Lateral Flexion Right;Left;10 reps   Shoulder Rolls Backwards;Forwards;10 reps   Other Seated Exercise Upper Thoracic Stretch; 10" hold; 3 sets   Other Seated Exercise Cervical Spine flexibility neck stretch; 10" hold, 3 sets   Shoulder Exercises: Supine   Protraction PROM;5 reps;Both;AROM;10 reps   Horizontal ABduction PROM;5 reps;Both;AROM;10 reps   External Rotation PROM;5 reps;Both;AROM;10 reps   Internal Rotation PROM;5 reps;Both;AROM;10 reps   Flexion PROM;5 reps;Both;AROM;10 reps   ABduction PROM;5 reps;Both;AROM;10 reps   Shoulder Exercises: Seated   Row AROM;10 reps   Manual Therapy   Manual Therapy Myofascial release   Myofascial Release Myofascial release to bilateral scapular regions, upper arm, shoulder region, and sternocleidomastoid region to decrease pain and fascial restrictions and improve pain free mobility in bilateral upper  extremity and cervical region.                    OT Short Term Goals - 10/07/14 0936    OT SHORT TERM GOAL #1   Title Patient will be educated on a HEP.   OT SHORT TERM GOAL #2   Title Patient will improve bilateral grip strength by 10 pounds and pinch strength by 2 pounds for increased abiilty to maintain grasp on objects.   Status Partially Met   OT SHORT TERM GOAL #3   Title Patient will improve fine motor coordination by decreasing completion time on the Nine Hole Peg Test by 5 seconds for increased ability to manipulate zippers and buttons.   Status Partially Met   OT SHORT TERM GOAL #4   Title Patient will improve  sensation in right hand to diminished protective sensation and left hand to loss of protective sensation.   Status Partially Met   OT SHORT TERM GOAL #5   Title Patient will improve bilateral shoulder and cervical AROM by50% for improved ability to reach overhead and put items into closet.   Status On-going   OT SHORT TERM GOAL #6   Title Patient will improve bilateral shoulder strength to 4+/5 or better for increased ability to lift bags of groceries.    Status On-going           OT Long Term Goals - 10/07/14 4734    OT LONG TERM GOAL #1   Title Patient will be able to fasten his own pants and hold his new grandbaby safely, as well as complete all BADLs with highest level of independence possible.   Status On-going   OT LONG TERM GOAL #2   Title Patient will improve bilateral grip strength by 15 pounds and pinch strength by 4 pounds for increased abiilty to maintain grasp on objects.   Status On-going   OT LONG TERM GOAL #3   Title Patient will improve fine motor coordination by decreasing completion time on the Nine Hole Peg Test by 10 seconds for increased ability to manipulate zippers and buttons.   Status On-going               Plan - 10/26/14 1107    Clinical Impression Statement A: Pt reports a new pain area by right medial area of scapula. No muscle knots palpated. Continued with myofascial release and shoulder and cervical stretches.   Plan P: Reassess and discharge. Update HEP.        Problem List Patient Active Problem List   Diagnosis Date Noted  . Varicose veins of leg with complications 03/70/9643  . Rectal pain 10/10/2013  . Rectal bleeding 10/10/2013  . Unspecified constipation 10/10/2013  . Fecal incontinence 10/10/2013  . Heme positive stool 10/10/2013  . OSA on CPAP 08/25/2013  . HIV exposure 07/29/2013  . Encounter for screening colonoscopy 10/10/2012  . Hemorrhoids 10/10/2012    Ailene Ravel, OTR/L,CBIS  [phone]  10/26/2014,  11:09 AM  [place] [address] [place], [place], [address] Phone: [phone]   Fax:  [phone]

## 2014-10-28 ENCOUNTER — Encounter (HOSPITAL_COMMUNITY): Payer: 59

## 2014-10-29 ENCOUNTER — Ambulatory Visit (HOSPITAL_COMMUNITY): Payer: 59

## 2014-10-29 ENCOUNTER — Encounter (HOSPITAL_COMMUNITY): Payer: Self-pay

## 2014-10-29 DIAGNOSIS — M542 Cervicalgia: Secondary | ICD-10-CM

## 2014-10-29 DIAGNOSIS — M62542 Muscle wasting and atrophy, not elsewhere classified, left hand: Secondary | ICD-10-CM | POA: Diagnosis not present

## 2014-10-29 DIAGNOSIS — M25619 Stiffness of unspecified shoulder, not elsewhere classified: Secondary | ICD-10-CM

## 2014-10-29 NOTE — Therapy (Signed)
[place], [place], [address] Phone: [phone]   Fax:  [phone]  Occupational Therapy Reassessment and Treatment  Patient Details  Name: Jerome [name] MRN: [id number] Date of Birth: 1960-12-08 Referring Provider:  Sharilyn Sites, MD  Encounter Date: 10/29/2014      OT End of Session - 10/29/14 1155    Visit Number 23   Number of Visits 23   Authorization Type [employer]    Authorization Time Period 23 visits approved per year   Authorization - Visit Number 23   Authorization - Number of Visits 23   OT Start Time 0934   OT Stop Time 1015   OT Time Calculation (min) 41 min   Activity Tolerance Patient tolerated treatment well   Behavior During Therapy [facility] for tasks assessed/performed      Past Medical History  Diagnosis Date  . DJD (degenerative joint disease)   . Spinal stenosis   . Hypertension   . Obesity   . Thrombocytopenia     Past Surgical History  Procedure Laterality Date  . Laparoscopic gastric sleeve resection  06/2012    Dr Mickie Bail [facility])  . Breast reduction surgery    . Back surgery      x 4  . Colonoscopy N/A 10/14/2012    AES:[id number] rectum and colon  . Flexible sigmoidoscopy N/A 10/15/2013    Procedure: FLEXIBLE SIGMOIDOSCOPY;  Surgeon: Daneil Dolin, MD;  Location[facility];  Service: Endoscopy;  Laterality: N/A;  8:30    There were no vitals filed for this visit.  Visit Diagnosis:  Neck pain, bilateral  Decreased shoulder mobility, unspecified laterality      Subjective Assessment - 10/29/14 1140    Subjective  S: I'm having a lot of trouble with my left hand today. I'm able to open and close it but I feel like it's taking longer. It's more delayed.   Currently in Pain? Yes   Pain Score 10-Worst pain ever   Pain Location Hip   Pain Orientation Left   Pain Descriptors / Indicators Constant;Discomfort;Radiating;Sharp;Shooting   Pain Type Acute pain           [facility]  OT Assessment - 10/29/14 0935    Assessment   Diagnosis Bilateral Hand Atrophy   Precautions   Precautions Cervical   Precaution Comments has had several neck surgeries - avoid manual cervical traction and MFR near the spinal column   Sensation   Semmes Weinstein Monofilament Scale Unresponsive   Coordination   Right 9 Hole Peg Test 26.16"  Not assessed this date due to time constraint.    Left 9 Hole Peg Test 29.31"  Not assessed this date due to time constaint.    ROM / Strength   AROM / PROM / Strength AROM;PROM;Strength   AROM   Overall AROM Comments Assessed seated. IR/ER abducted   AROM Assessment Site Shoulder   Right/Left Shoulder Right;Left   Right Shoulder Flexion 120 Degrees  on eval: 170   Right Shoulder ABduction 142 Degrees  on eval: 135   Right Shoulder Internal Rotation 41 Degrees  on eval: 60   Right Shoulder External Rotation 61 Degrees  on eval: 60   Left Shoulder Flexion 145 Degrees  on eval: 130   Left Shoulder ABduction 131 Degrees  on eval: 140   Left Shoulder Internal Rotation 55 Degrees  on eval: 40   Left Shoulder External Rotation 35 Degrees  same at eval  Cervical Flexion WFL   Cervical Extension 10 cm  on eval: 13.0 cm   Cervical - Right Side Bend 17 degrees  on eval: 20 degrees   Cervical - Left Side Bend 20 degrees  on eval: 15 degrees   Cervical - Right Rotation 20 degrees  same at eval   Cervical - Left Rotation 20 degrees  on eval: 23 degrees   Strength   Overall Strength Comments assessed in seated, ER/IR with shoulder adducted   Strength Assessment Site Shoulder   Right/Left Shoulder Right;Left   Right Shoulder Flexion 4+/5  on eval: 5/5   Right Shoulder ABduction 4+/5  same at eval   Right Shoulder Internal Rotation 4+/5  same at eval   Right Shoulder External Rotation 4+/5  same at eval   Left Shoulder Flexion 3+/5  on eval: 4-/5   Left Shoulder ABduction 4/5  on eval: 4-/5   Left Shoulder Internal Rotation --  4+/5    Left Shoulder External Rotation 4+/5  on eval: 4-/5   Grip (lbs) 65  last progress note: 60   Right Hand Lateral Pinch 12 lbs  last progress note: 14   Right Hand 3 Point Pinch 5 lbs  last progress note: 8   Grip (lbs) 30  last progress note: 18   Left Hand Lateral Pinch 5 lbs  last progress note: 6.5   Left Hand 3 Point Pinch 3 lbs  last progress note: 6                         OT Education - 10/29/14 1155    Education provided Yes   Education Details cervical stretches   Person(s) Educated Patient   Methods Explanation;Handout   Comprehension Verbalized understanding          OT Short Term Goals - 10/29/14 1157    OT SHORT TERM GOAL #1   Title Patient will be educated on a HEP.   OT SHORT TERM GOAL #2   Title Patient will improve bilateral grip strength by 10 pounds and pinch strength by 2 pounds for increased abiilty to maintain grasp on objects.   Status Partially Met   OT SHORT TERM GOAL #3   Title Patient will improve fine motor coordination by decreasing completion time on the Nine Hole Peg Test by 5 seconds for increased ability to manipulate zippers and buttons.   Status Partially Met   OT SHORT TERM GOAL #4   Title Patient will improve sensation in right hand to diminished protective sensation and left hand to loss of protective sensation.   Status Partially Met   OT SHORT TERM GOAL #5   Title Patient will improve bilateral shoulder and cervical AROM by50% for improved ability to reach overhead and put items into closet.   Status Achieved   OT SHORT TERM GOAL #6   Title Patient will improve bilateral shoulder strength to 4+/5 or better for increased ability to lift bags of groceries.    Status On-going           OT Long Term Goals - 10/29/14 1157    OT LONG TERM GOAL #1   Title Patient will be able to fasten his own pants and hold his new grandbaby safely, as well as complete all BADLs with highest level of independence possible.    Status On-going   OT LONG TERM GOAL #2   Title Patient will improve bilateral grip strength by 15 pounds and  pinch strength by 4 pounds for increased abiilty to maintain grasp on objects.   Status On-going   OT LONG TERM GOAL #3   Title Patient will improve fine motor coordination by decreasing completion time on the Nine Hole Peg Test by 10 seconds for increased ability to manipulate zippers and buttons.   Status On-going               Plan - 10/29/14 1156    Clinical Impression Statement A: Reassessment and discharge completed this date as patient has used 23 out of 23 visits allowed for OT this year. Patient has made very little progress at this reassessment. His pinch strength has decreased. Overall, patient has met 2/6 STGs and no LTGs.    Plan P: D/C from therapy.        Problem List Patient Active Problem List   Diagnosis Date Noted  . Varicose veins of leg with complications 80/32/1224  . Rectal pain 10/10/2013  . Rectal bleeding 10/10/2013  . Unspecified constipation 10/10/2013  . Fecal incontinence 10/10/2013  . Heme positive stool 10/10/2013  . OSA on CPAP 08/25/2013  . HIV exposure 07/29/2013  . Encounter for screening colonoscopy 10/10/2012  . Hemorrhoids 10/10/2012    OCCUPATIONAL THERAPY DISCHARGE SUMMARY  Visits from Start of Care: 23  Current functional level related to goals / functional outcomes: See goals above   Remaining deficits: See clinical impression statement above   Education / Equipment: Fine motor coordination, hand strengthening exercises, cervical stretches Plan: Patient agrees to discharge.  Patient goals were partially met. Patient is being discharged due to financial reasons.  ?????       Ailene Ravel, OTR/L,CBIS  [phone]  10/29/2014, 12:01 PM  [place] [address] [place], [place], [address] Phone: [phone]   Fax:  [phone]

## 2014-10-29 NOTE — Patient Instructions (Signed)
  Flexibility: Upper Trapezius Stretch   Gently grasp right side of head while reaching behind back with other hand. Tilt head away until a gentle stretch is felt. Hold ____ seconds. Repeat ____ times per set. Do ____ sets per session. Do ____ sessions per day.  http://orth.exer.us/340   Levator Stretch   Grasp seat or sit on hand on side to be stretched. Turn head toward other side and look down. Use hand on head to gently stretch neck in that position. Hold ____ seconds. Repeat on other side. Repeat ____ times. Do ____ sessions per day.  http://[email or web address].exer.us/30   Scapular Retraction (Standing)   With arms at sides, pinch shoulder blades together. Repeat ____ times per set. Do ____ sets per session. Do ____ sessions per day.  http://orth.exer.us/944   Flexibility: Neck Retraction   Pull head straight back, keeping eyes and jaw level. Repeat ____ times per set. Do ____ sets per session. Do ____ sessions per day.  http://orth.exer.us/344   Posture - Sitting   Sit upright, head facing forward. Try using a roll to support lower back. Keep shoulders relaxed, and avoid rounded back. Keep hips level with knees. Avoid crossing legs for long periods.   Flexibility: Corner Stretch   Standing in corner with hands just above shoulder level and feet ____ inches from corner, lean forward until a comfortable stretch is felt across chest. Hold ____ seconds. Repeat ____ times per set. Do ____ sets per session. Do ____ sessions per day.  http://orth.exer.us/342   Copyright  VHI. All rights reserved.    

## 2015-02-21 NOTE — Progress Notes (Signed)
REVIEWED-NO ADDITIONAL RECOMMENDATIONS. 

## 2015-02-26 ENCOUNTER — Ambulatory Visit (HOSPITAL_COMMUNITY)
Admission: RE | Admit: 2015-02-26 | Discharge: 2015-02-26 | Disposition: A | Discharge status: Home / Self Care | Payer: Medicare HMO | Source: Ambulatory Visit | Attending: Physician Assistant | Admitting: Physician Assistant

## 2015-02-26 ENCOUNTER — Other Ambulatory Visit (HOSPITAL_COMMUNITY): Payer: Self-pay | Admitting: Physician Assistant

## 2015-02-26 DIAGNOSIS — R05 Cough: Secondary | ICD-10-CM | POA: Insufficient documentation

## 2015-02-26 DIAGNOSIS — R062 Wheezing: Secondary | ICD-10-CM

## 2015-03-02 ENCOUNTER — Encounter: Payer: Self-pay | Admitting: *Deleted

## 2015-03-30 ENCOUNTER — Encounter: Payer: Self-pay | Admitting: Cardiovascular Disease

## 2015-04-26 DIAGNOSIS — L570 Actinic keratosis: Secondary | ICD-10-CM | POA: Diagnosis not present

## 2015-04-26 DIAGNOSIS — D2372 Other benign neoplasm of skin of left lower limb, including hip: Secondary | ICD-10-CM | POA: Diagnosis not present

## 2015-04-26 DIAGNOSIS — L304 Erythema intertrigo: Secondary | ICD-10-CM | POA: Diagnosis not present

## 2015-04-26 DIAGNOSIS — D225 Melanocytic nevi of trunk: Secondary | ICD-10-CM | POA: Diagnosis not present

## 2015-04-26 DIAGNOSIS — X32XXXA Exposure to sunlight, initial encounter: Secondary | ICD-10-CM | POA: Diagnosis not present

## 2015-04-27 DIAGNOSIS — M5481 Occipital neuralgia: Secondary | ICD-10-CM | POA: Diagnosis not present

## 2015-04-27 DIAGNOSIS — M5412 Radiculopathy, cervical region: Secondary | ICD-10-CM | POA: Diagnosis not present

## 2015-04-27 DIAGNOSIS — M961 Postlaminectomy syndrome, not elsewhere classified: Secondary | ICD-10-CM | POA: Diagnosis not present

## 2015-04-27 DIAGNOSIS — D691 Qualitative platelet defects: Secondary | ICD-10-CM | POA: Diagnosis not present

## 2015-04-27 DIAGNOSIS — D696 Thrombocytopenia, unspecified: Secondary | ICD-10-CM | POA: Diagnosis not present

## 2015-04-27 DIAGNOSIS — M4802 Spinal stenosis, cervical region: Secondary | ICD-10-CM | POA: Diagnosis not present

## 2015-04-27 DIAGNOSIS — M5416 Radiculopathy, lumbar region: Secondary | ICD-10-CM | POA: Diagnosis not present

## 2015-05-18 DIAGNOSIS — D691 Qualitative platelet defects: Secondary | ICD-10-CM | POA: Diagnosis not present

## 2015-05-18 DIAGNOSIS — M5416 Radiculopathy, lumbar region: Secondary | ICD-10-CM | POA: Diagnosis not present

## 2015-05-24 DIAGNOSIS — G4730 Sleep apnea, unspecified: Secondary | ICD-10-CM | POA: Diagnosis not present

## 2015-05-24 DIAGNOSIS — Z1389 Encounter for screening for other disorder: Secondary | ICD-10-CM | POA: Diagnosis not present

## 2015-05-24 DIAGNOSIS — Z681 Body mass index (BMI) 19 or less, adult: Secondary | ICD-10-CM | POA: Diagnosis not present

## 2015-05-24 DIAGNOSIS — R5383 Other fatigue: Secondary | ICD-10-CM | POA: Diagnosis not present

## 2015-05-28 DIAGNOSIS — R947 Abnormal results of other endocrine function studies: Secondary | ICD-10-CM | POA: Diagnosis not present

## 2015-06-06 ENCOUNTER — Emergency Department (HOSPITAL_COMMUNITY)
Admission: EM | Admit: 2015-06-06 | Discharge: 2015-06-06 | Disposition: A | Discharge status: Home / Self Care | Payer: Medicare HMO | Attending: Emergency Medicine | Admitting: Emergency Medicine

## 2015-06-06 ENCOUNTER — Emergency Department (HOSPITAL_COMMUNITY): Payer: Medicare HMO

## 2015-06-06 ENCOUNTER — Encounter (HOSPITAL_COMMUNITY): Payer: Self-pay | Admitting: Emergency Medicine

## 2015-06-06 DIAGNOSIS — R079 Chest pain, unspecified: Secondary | ICD-10-CM

## 2015-06-06 DIAGNOSIS — M545 Low back pain: Secondary | ICD-10-CM | POA: Diagnosis not present

## 2015-06-06 DIAGNOSIS — G8929 Other chronic pain: Secondary | ICD-10-CM

## 2015-06-06 DIAGNOSIS — F419 Anxiety disorder, unspecified: Secondary | ICD-10-CM | POA: Insufficient documentation

## 2015-06-06 DIAGNOSIS — I10 Essential (primary) hypertension: Secondary | ICD-10-CM | POA: Insufficient documentation

## 2015-06-06 DIAGNOSIS — M542 Cervicalgia: Secondary | ICD-10-CM | POA: Diagnosis not present

## 2015-06-06 DIAGNOSIS — Z87891 Personal history of nicotine dependence: Secondary | ICD-10-CM | POA: Diagnosis not present

## 2015-06-06 DIAGNOSIS — M1990 Unspecified osteoarthritis, unspecified site: Secondary | ICD-10-CM | POA: Diagnosis not present

## 2015-06-06 DIAGNOSIS — Z9889 Other specified postprocedural states: Secondary | ICD-10-CM | POA: Diagnosis not present

## 2015-06-06 DIAGNOSIS — Z862 Personal history of diseases of the blood and blood-forming organs and certain disorders involving the immune mechanism: Secondary | ICD-10-CM | POA: Diagnosis not present

## 2015-06-06 DIAGNOSIS — R51 Headache: Secondary | ICD-10-CM | POA: Diagnosis not present

## 2015-06-06 DIAGNOSIS — Z79899 Other long term (current) drug therapy: Secondary | ICD-10-CM | POA: Diagnosis not present

## 2015-06-06 DIAGNOSIS — R61 Generalized hyperhidrosis: Secondary | ICD-10-CM | POA: Diagnosis not present

## 2015-06-06 DIAGNOSIS — R55 Syncope and collapse: Secondary | ICD-10-CM | POA: Diagnosis not present

## 2015-06-06 DIAGNOSIS — E7800 Pure hypercholesterolemia, unspecified: Secondary | ICD-10-CM | POA: Diagnosis not present

## 2015-06-06 DIAGNOSIS — R69 Illness, unspecified: Secondary | ICD-10-CM | POA: Diagnosis not present

## 2015-06-06 HISTORY — DX: Anxiety disorder, unspecified: F41.9

## 2015-06-06 NOTE — ED Provider Notes (Signed)
CSN: [id number]     Arrival date & time 06/06/15  4098 History  By signing my name below, I, [name], attest that this documentation has been prepared under the direction and in the presence of Daleen Bo, MD. Electronically Signed: [name], ED Scribe. 06/06/2015. 10:44 AM.     Chief Complaint  Patient presents with  . Near Syncope  . Chest Pain   The history is provided by the patient. No language interpreter was used.    HPI Comments: Jerome [name] is a 54 y.o. male with a history of cervical stenosis and anxiety, brought in by ambulance, who presents to the Emergency Department complaining of an episode of chest pain and near-syncope that occurred PTA. Patient reports his symptoms began this morning with a headache that is typical for him and which he attributes to occipital nerve pain. His pain then radiated down his neck to his posterior left arm, across shoulder blades which gradually worsened. He states he has had similar pains in the past due to cervical stenosis, but they had never been as severe as they had been today. He states his heart started pounding and he began to have mid-sternal chest pains, and when he became pale and diaphoretic, his daughter called 911. He states his chest pains resolved PTA, and he feels "embarrassed" as he believes his chest pain was caused by his anxiety. He notes a history of 5 operations on his spine - 3 on his neck and 2 on his lower back. 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 He is currently undergoing pain management and cervical decompression, receiving epidural injections at C7-T1 and L4-L5 injections due to sciatic nerve pain. He is also prescribed oxycodone 10 mg BID but states he has not taken his pain medication for the past few weeks as he has been busy helping a friend with a project. Patient reports he has had steroids  prescribed to him intermittently in the past for his pain. He notes a history of HLD and HTN, for which he takes pravastatin 40 mg.    Past Medical History  Diagnosis Date  . DJD (degenerative joint disease)   . Spinal stenosis   . Hypertension   . Obesity   . Thrombocytopenia ([place])   . H/O echocardiogram 2012    for cad, htn & obesity  . H/O cardiovascular stress test 2013  . Anxiety    Past Surgical History  Procedure Laterality Date  . Laparoscopic gastric sleeve resection  06/2012    Dr Mickie Bail [facility])  . Breast reduction surgery    . Back surgery      x 4  . Colonoscopy N/A 10/14/2012    [id number]:[id number] rectum and colon  . Flexible sigmoidoscopy N/A 10/15/2013    Procedure: FLEXIBLE SIGMOIDOSCOPY;  Surgeon: Daneil Dolin, MD;  Location: [facility];  Service: Endoscopy;  Laterality: N/A;  8:30  . H/o cardiac cath    . Cardiac catheterization  2011    5 frech sheath   Family History  Problem Relation Age of Onset  . Colon cancer Paternal Grandmother   . Brain cancer Brother 35  . Heart attack Father    Social History  Substance Use Topics  . Smoking status: Former Smoker -- 0.50 packs/day    Types: Cigarettes    Quit date: 06/12/2012  . Smokeless tobacco: Never Used  .  Alcohol Use: No    Review of Systems  All other systems reviewed and are negative.  Allergies  Aspirin; Codeine; and Nsaids  Home Medications   Prior to Admission medications   Medication Sig Start Date End Date Taking? Authorizing Provider  acetaminophen (TYLENOL) 500 MG tablet Take 1,000 mg by mouth every 6 (six) hours as needed.    Historical Provider, MD  AMITIZA 24 MCG capsule TAKE 1 CAPSULE TWICE DAILY WITH FOOD. 10/19/14   Carlis Stable, NP  calcium citrate-vitamin D (CITRACAL+D) 315-200 MG-UNIT per tablet Take 1 tablet by mouth 2 (two) times daily.    Historical Provider, MD  cetirizine (ZYRTEC) 10 MG tablet Take 10 mg by mouth as needed for allergies.    Historical Provider, MD   cholecalciferol (VITAMIN D) 400 UNITS TABS tablet Take 400 Units by mouth daily.    Historical Provider, MD  clonazePAM (KLONOPIN) 1 MG tablet Take 1 mg by mouth at bedtime as needed for anxiety (Sleep).  09/23/12   Historical Provider, MD  cyclobenzaprine (FLEXERIL) 10 MG tablet Take 10 mg by mouth as needed for muscle spasms.    Historical Provider, MD  Flaxseed, Linseed, (FLAX SEED OIL PO) Take 1 tablet by mouth daily.     Historical Provider, MD  hydrocortisone (ANUSOL-HC) 2.5 % rectal cream Place 1 application rectally 2 (two) times daily as needed for hemorrhoids or itching. USE FOR MAX 7 DAYS AT A TIME 09/17/14   Carlis Stable, NP  Lidocaine-Hydrocortisone Ace (ANA-LEX) 2-2 % KIT Place 1 applicator rectally 2 (two) times daily. Patient not taking: Reported on 09/14/2014 10/22/13   Mahala Menghini, PA-C  losartan (COZAAR) 50 MG tablet Take 50 mg by mouth daily.  09/13/12   Historical Provider, MD  Multiple Vitamin (MULTIVITAMIN) capsule Take 2 capsules by mouth daily.    Historical Provider, MD  Naloxegol Oxalate (MOVANTIK) 25 MG TABS Take 25 mg by mouth daily. TAKE ON AN EMPTY STOMACH IN THE MORNING. STOP OTHER LAXATIVES WHILE STARTING THIS MEDICATION. 09/08/14   Carlis Stable, NP  Nitroglycerin (RECTIV) 0.4 % OINT Apply one inch line of medication anorectally twice per day for three weeks. Use glove to avoid exposure of medication to hand. Patient not taking: Reported on 09/14/2014 10/10/13   Mahala Menghini, PA-C  nortriptyline (PAMELOR) 10 MG capsule Take 10 mg by mouth at bedtime.  06/10/13   Historical Provider, MD  nystatin-triamcinolone (MYCOLOG II) cream  07/02/13   Historical Provider, MD  omeprazole (PRILOSEC) 20 MG capsule Take 20 mg by mouth daily.  09/23/12   Historical Provider, MD  Oxycodone HCl 10 MG TABS Every 4-6 hours. 07/14/13   Historical Provider, MD  pravastatin (PRAVACHOL) 40 MG tablet Take 40 mg by mouth daily.    Historical Provider, MD  sertraline (ZOLOFT) 100 MG tablet Take 100 mg  by mouth daily.  09/27/12   Historical Provider, MD  STENDRA 200 MG TABS  06/05/13   Historical Provider, MD  vitamin B-12 (CYANOCOBALAMIN) 1000 MCG tablet Take 1,000 mcg by mouth daily.    Historical Provider, MD  zinc gluconate 50 MG tablet Take 50 mg by mouth daily.    Historical Provider, MD   There were no vitals taken for this visit. Physical Exam  Constitutional: He is oriented to person, place, and time. He appears well-developed and well-nourished.  HENT:  Head: Normocephalic and atraumatic.  Right Ear: External ear normal.  Left Ear: External ear normal.  Eyes: Conjunctivae and EOM are  normal. Pupils are equal, round, and reactive to light.  Neck: Normal range of motion and phonation normal. Neck supple.  Cardiovascular: Normal rate, regular rhythm and normal heart sounds.   Pulmonary/Chest: Effort normal and breath sounds normal. He exhibits no bony tenderness.  Abdominal: Soft. There is no tenderness.  Musculoskeletal: Normal range of motion.  Pt is holding his left arm in an antalgic, splinted position in such a way so as to decompress the neck.  Neurological: He is alert and oriented to person, place, and time. No cranial nerve deficit or sensory deficit. He exhibits normal muscle tone. Coordination normal.  Skin: Skin is warm, dry and intact.  Psychiatric: He has a normal mood and affect. His behavior is normal. Judgment and thought content normal.  Nursing note and vitals reviewed.   ED Course  Procedures (including critical care time)  COORDINATION OF CARE: 9:07 AM - Discussed treatment plan with pt at bedside. Pt verbalized understanding and agreed to plan.   Medications - No data to display  No data found.   11:15 AM Reevaluation with update and discussion. After initial assessment and treatment, an updated evaluation reveals he remains comfortable. He states his chest pain has resolved. Findings discussed with patient, all questions were answered.. [place] Review Labs Reviewed - No data to display  Imaging Review No results found. I have personally reviewed and evaluated these images and lab results as part of my medical decision-making.   EKG Interpretation   Date/Time:  Sunday June 06 2015 09:26:43 EST Ventricular Rate:  93 PR Interval:  141 QRS Duration: 102 QT Interval:  361 QTC Calculation: 449 R Axis:   77 Text Interpretation:  Sinus rhythm Right atrial enlargement Artifact in  lead(s) I III aVR aVL aVF V1 No old tracing to compare Confirmed by [facility]   MD, Albeiro Trompeter [phone]) on 06/06/2015 10:02:32 AM      MDM   Final diagnoses:  None    Worsening neck pain secondary to not taking usual medications, with palpitations, and chest pain, likely related to neck pain and not, acute cardiac etiology. Doubt ACS, pneumonia, spinal myelopathy.  Nursing Notes Reviewed/ Care Coordinated Applicable Imaging Reviewed Interpretation of Laboratory Data incorporated into ED treatment  The patient appears reasonably screened and/or stabilized for discharge and I doubt any other medical condition or other [facility] requiring further screening, evaluation, or treatment in the ED at this time prior to discharge.  Plan: Home Medications- usual; Home Treatments- rest; return here if the recommended treatment, does not improve the symptoms; Recommended follow up- PCP prn   I personally performed the services described in this documentation, which was scribed in my presence. The recorded information has been reviewed and is accurate.        Daleen Bo, MD 06/06/15 [phone]

## 2015-06-06 NOTE — ED Notes (Signed)
Pt do not want pain medication.

## 2015-06-06 NOTE — ED Notes (Signed)
Patient brought in via EMS. Alert and oriented. Patient reports having pain cross back between shoulder blades that radiated into head and down left arm which patient states that is a pain that he is accustomed to. Patient states pain in neck radiating into head became more progressive. Patient states that he tried meditation techniques with no relief. Per patient became pale diaphoretic-states "I started seeing starts and fell back into wall." Patient then started having mid-sternal chest pain and slight shortness of breath which patient contributes to anxiety. Per patient used to have severe anxiety attacks but has not had "one in a long time"

## 2015-06-06 NOTE — ED Notes (Signed)
MD at bedside. 

## 2015-06-06 NOTE — Discharge Instructions (Signed)
Chronic Pain  Chronic pain can be defined as pain that is off and on and lasts for 3-6 months or longer. Many things cause chronic pain, which can make it difficult to make a diagnosis. There are many treatment options available for chronic pain. However, finding a treatment that works well for you may require trying various approaches until the right one is found. Many people benefit from a combination of two or more types of treatment to control their pain.  SYMPTOMS   Chronic pain can occur anywhere in the body and can range from mild to very severe. Some types of chronic pain include:  · Headache.  · Low back pain.  · Cancer pain.  · Arthritis pain.  · Neurogenic pain. This is pain resulting from damage to nerves.   People with chronic pain may also have other symptoms such as:  · Depression.  · Anger.  · Insomnia.  · Anxiety.  DIAGNOSIS   Your health care provider will help diagnose your condition over time. In many cases, the initial focus will be on excluding possible conditions that could be causing the pain. Depending on your symptoms, your health care provider may order tests to diagnose your condition. Some of these tests may include:   · Blood tests.    · CT scan.    · MRI.    · X-rays.    · Ultrasounds.    · Nerve conduction studies.    You may need to see a specialist.   TREATMENT   Finding treatment that works well may take time. You may be referred to a pain specialist. He or she may prescribe medicine or therapies, such as:   · Mindful meditation or yoga.  · Shots (injections) of numbing or pain-relieving medicines into the spine or area of pain.  · Local electrical stimulation.  · Acupuncture.    · Massage therapy.    · Aroma, color, light, or sound therapy.    · Biofeedback.    · Working with a physical therapist to keep from getting stiff.    · Regular, gentle exercise.    · Cognitive or behavioral therapy.    · Group support.    Sometimes, surgery may be recommended.   HOME CARE INSTRUCTIONS    · Take all medicines as directed by your health care provider.    · Lessen stress in your life by relaxing and doing things such as listening to calming music.    · Exercise or be active as directed by your health care provider.    · Eat a healthy diet and include things such as vegetables, fruits, fish, and lean meats in your diet.    · Keep all follow-up appointments with your health care provider.    · Attend a support group with others suffering from chronic pain.  SEEK MEDICAL CARE IF:   · Your pain gets worse.    · You develop a new pain that was not there before.    · You cannot tolerate medicines given to you by your health care provider.    · You have new symptoms since your last visit with your health care provider.    SEEK IMMEDIATE MEDICAL CARE IF:   · You feel weak.    · You have decreased sensation or numbness.    · You lose control of bowel or bladder function.    · Your pain suddenly gets much worse.    · You develop shaking.  · You develop chills.  · You develop confusion.  · You develop chest pain.  · You develop shortness of breath.    MAKE SURE YOU:  ·   Document Revised: 03/12/2013 Document Reviewed: 01/03/2013 Elsevier Interactive Patient Education 2016 Elsevier Inc.  Nonspecific Chest Pain It is often hard to find the cause of chest pain. There is always a chance that your pain could be related to something serious, such as a heart attack or a blood clot in your lungs. Chest pain can also be caused by conditions that are not life-threatening. If you have chest pain, it is very important to follow up with your doctor.  HOME CARE  If you were prescribed  an antibiotic medicine, finish it all even if you start to feel better.  Avoid any activities that cause chest pain.  Do not use any tobacco products, including cigarettes, chewing tobacco, or electronic cigarettes. If you need help quitting, ask your doctor.  Do not drink alcohol.  Take medicines only as told by your doctor.  Keep all follow-up visits as told by your doctor. This is important. This includes any further testing if your chest pain does not go away.  Your doctor may tell you to keep your head raised (elevated) while you sleep.  Make lifestyle changes as told by your doctor. These may include:  Getting regular exercise. Ask your doctor to suggest some activities that are safe for you.  Eating a heart-healthy diet. Your doctor or a diet specialist (dietitian) can help you to learn healthy eating options.  Maintaining a healthy weight.  Managing diabetes, if necessary.  Reducing stress. GET HELP IF:  Your chest pain does not go away, even after treatment.  You have a rash with blisters on your chest.  You have a fever. GET HELP RIGHT AWAY IF:  Your chest pain is worse.  You have an increasing cough, or you cough up blood.  You have severe belly (abdominal) pain.  You feel extremely weak.  You pass out (faint).  You have chills.  You have sudden, unexplained chest discomfort.  You have sudden, unexplained discomfort in your arms, back, neck, or jaw.  You have shortness of breath at any time.  You suddenly start to sweat, or your skin gets clammy.  You feel nauseous.  You vomit.  You suddenly feel light-headed or dizzy.  Your heart begins to beat quickly, or it feels like it is skipping beats. These symptoms may be an emergency. Do not wait to see if the symptoms will go away. Get medical help right away. Call your local emergency services (911 in the [place] Do not drive yourself to the hospital.   This information is not intended to replace  advice given to you by your health care provider. Make sure you discuss any questions you have with your health care provider.   Document Released: 12/27/2007 Document Revised: 07/31/2014 Document Reviewed: 02/13/2014 Elsevier Interactive Patient Education [employer].

## 2015-06-08 DIAGNOSIS — I10 Essential (primary) hypertension: Secondary | ICD-10-CM | POA: Diagnosis not present

## 2015-06-08 DIAGNOSIS — H5210 Myopia, unspecified eye: Secondary | ICD-10-CM | POA: Diagnosis not present

## 2015-06-15 DIAGNOSIS — M5416 Radiculopathy, lumbar region: Secondary | ICD-10-CM | POA: Diagnosis not present

## 2015-06-15 DIAGNOSIS — D691 Qualitative platelet defects: Secondary | ICD-10-CM | POA: Diagnosis not present

## 2015-06-15 DIAGNOSIS — M961 Postlaminectomy syndrome, not elsewhere classified: Secondary | ICD-10-CM | POA: Diagnosis not present

## 2015-06-24 DIAGNOSIS — M5412 Radiculopathy, cervical region: Secondary | ICD-10-CM | POA: Diagnosis not present

## 2015-06-24 DIAGNOSIS — R69 Illness, unspecified: Secondary | ICD-10-CM | POA: Diagnosis not present

## 2015-06-24 DIAGNOSIS — M4802 Spinal stenosis, cervical region: Secondary | ICD-10-CM | POA: Diagnosis not present

## 2015-06-24 DIAGNOSIS — M5481 Occipital neuralgia: Secondary | ICD-10-CM | POA: Diagnosis not present

## 2015-06-24 DIAGNOSIS — M961 Postlaminectomy syndrome, not elsewhere classified: Secondary | ICD-10-CM | POA: Diagnosis not present

## 2015-06-24 DIAGNOSIS — D696 Thrombocytopenia, unspecified: Secondary | ICD-10-CM | POA: Diagnosis not present

## 2015-06-24 DIAGNOSIS — M5416 Radiculopathy, lumbar region: Secondary | ICD-10-CM | POA: Diagnosis not present

## 2015-06-24 DIAGNOSIS — D691 Qualitative platelet defects: Secondary | ICD-10-CM | POA: Diagnosis not present

## 2015-07-28 DIAGNOSIS — M5416 Radiculopathy, lumbar region: Secondary | ICD-10-CM | POA: Diagnosis not present

## 2015-07-28 DIAGNOSIS — Z9884 Bariatric surgery status: Secondary | ICD-10-CM | POA: Diagnosis not present

## 2015-07-28 DIAGNOSIS — M5412 Radiculopathy, cervical region: Secondary | ICD-10-CM | POA: Diagnosis not present

## 2015-07-28 DIAGNOSIS — E785 Hyperlipidemia, unspecified: Secondary | ICD-10-CM | POA: Diagnosis not present

## 2015-07-28 DIAGNOSIS — D696 Thrombocytopenia, unspecified: Secondary | ICD-10-CM | POA: Diagnosis not present

## 2015-07-28 DIAGNOSIS — K219 Gastro-esophageal reflux disease without esophagitis: Secondary | ICD-10-CM | POA: Diagnosis not present

## 2015-07-28 DIAGNOSIS — Z79899 Other long term (current) drug therapy: Secondary | ICD-10-CM | POA: Diagnosis not present

## 2015-07-28 DIAGNOSIS — G4733 Obstructive sleep apnea (adult) (pediatric): Secondary | ICD-10-CM | POA: Diagnosis not present

## 2015-07-28 DIAGNOSIS — M4802 Spinal stenosis, cervical region: Secondary | ICD-10-CM | POA: Diagnosis not present

## 2015-07-28 DIAGNOSIS — N4883 Acquired buried penis: Secondary | ICD-10-CM | POA: Diagnosis not present

## 2015-07-28 DIAGNOSIS — I10 Essential (primary) hypertension: Secondary | ICD-10-CM | POA: Diagnosis not present

## 2015-08-03 DIAGNOSIS — I10 Essential (primary) hypertension: Secondary | ICD-10-CM | POA: Diagnosis not present

## 2015-08-03 DIAGNOSIS — E65 Localized adiposity: Secondary | ICD-10-CM | POA: Diagnosis not present

## 2015-08-03 DIAGNOSIS — R69 Illness, unspecified: Secondary | ICD-10-CM | POA: Diagnosis not present

## 2015-08-03 DIAGNOSIS — E881 Lipodystrophy, not elsewhere classified: Secondary | ICD-10-CM | POA: Diagnosis not present

## 2015-08-03 DIAGNOSIS — Z87891 Personal history of nicotine dependence: Secondary | ICD-10-CM | POA: Diagnosis not present

## 2015-08-03 DIAGNOSIS — Z9884 Bariatric surgery status: Secondary | ICD-10-CM | POA: Diagnosis not present

## 2015-08-03 DIAGNOSIS — N4883 Acquired buried penis: Secondary | ICD-10-CM | POA: Diagnosis not present

## 2015-08-03 DIAGNOSIS — D696 Thrombocytopenia, unspecified: Secondary | ICD-10-CM | POA: Diagnosis not present

## 2015-08-03 DIAGNOSIS — K219 Gastro-esophageal reflux disease without esophagitis: Secondary | ICD-10-CM | POA: Diagnosis not present

## 2015-08-06 DIAGNOSIS — E860 Dehydration: Secondary | ICD-10-CM | POA: Diagnosis not present

## 2015-08-06 DIAGNOSIS — Y838 Other surgical procedures as the cause of abnormal reaction of the patient, or of later complication, without mention of misadventure at the time of the procedure: Secondary | ICD-10-CM | POA: Diagnosis not present

## 2015-08-06 DIAGNOSIS — E785 Hyperlipidemia, unspecified: Secondary | ICD-10-CM | POA: Diagnosis not present

## 2015-08-06 DIAGNOSIS — R69 Illness, unspecified: Secondary | ICD-10-CM | POA: Diagnosis not present

## 2015-08-06 DIAGNOSIS — L7632 Postprocedural hematoma of skin and subcutaneous tissue following other procedure: Secondary | ICD-10-CM | POA: Diagnosis not present

## 2015-08-06 DIAGNOSIS — I10 Essential (primary) hypertension: Secondary | ICD-10-CM | POA: Diagnosis not present

## 2015-08-06 DIAGNOSIS — R1030 Lower abdominal pain, unspecified: Secondary | ICD-10-CM | POA: Diagnosis not present

## 2015-08-06 DIAGNOSIS — R000 Tachycardia, unspecified: Secondary | ICD-10-CM | POA: Diagnosis not present

## 2015-08-06 DIAGNOSIS — K219 Gastro-esophageal reflux disease without esophagitis: Secondary | ICD-10-CM | POA: Diagnosis not present

## 2015-08-06 DIAGNOSIS — D696 Thrombocytopenia, unspecified: Secondary | ICD-10-CM | POA: Diagnosis not present

## 2015-08-06 DIAGNOSIS — I959 Hypotension, unspecified: Secondary | ICD-10-CM | POA: Diagnosis not present

## 2015-08-06 DIAGNOSIS — R42 Dizziness and giddiness: Secondary | ICD-10-CM | POA: Diagnosis not present

## 2015-08-08 DIAGNOSIS — M96840 Postprocedural hematoma of a musculoskeletal structure following a musculoskeletal system procedure: Secondary | ICD-10-CM | POA: Diagnosis not present

## 2015-08-08 DIAGNOSIS — S301XXA Contusion of abdominal wall, initial encounter: Secondary | ICD-10-CM | POA: Diagnosis not present

## 2015-08-08 DIAGNOSIS — R58 Hemorrhage, not elsewhere classified: Secondary | ICD-10-CM | POA: Diagnosis not present

## 2015-08-08 DIAGNOSIS — R918 Other nonspecific abnormal finding of lung field: Secondary | ICD-10-CM | POA: Diagnosis not present

## 2015-08-17 DIAGNOSIS — E881 Lipodystrophy, not elsewhere classified: Secondary | ICD-10-CM | POA: Diagnosis not present

## 2015-08-17 DIAGNOSIS — N4883 Acquired buried penis: Secondary | ICD-10-CM | POA: Diagnosis not present

## 2015-08-24 DIAGNOSIS — Z1389 Encounter for screening for other disorder: Secondary | ICD-10-CM | POA: Diagnosis not present

## 2015-08-24 DIAGNOSIS — G4730 Sleep apnea, unspecified: Secondary | ICD-10-CM | POA: Diagnosis not present

## 2015-08-24 DIAGNOSIS — Z6827 Body mass index (BMI) 27.0-27.9, adult: Secondary | ICD-10-CM | POA: Diagnosis not present

## 2015-08-24 DIAGNOSIS — I10 Essential (primary) hypertension: Secondary | ICD-10-CM | POA: Diagnosis not present

## 2015-08-25 DIAGNOSIS — E881 Lipodystrophy, not elsewhere classified: Secondary | ICD-10-CM | POA: Diagnosis not present

## 2015-08-31 DIAGNOSIS — D691 Qualitative platelet defects: Secondary | ICD-10-CM | POA: Diagnosis not present

## 2015-08-31 DIAGNOSIS — M5013 Cervical disc disorder with radiculopathy, cervicothoracic region: Secondary | ICD-10-CM | POA: Diagnosis not present

## 2015-08-31 DIAGNOSIS — M5412 Radiculopathy, cervical region: Secondary | ICD-10-CM | POA: Diagnosis not present

## 2015-09-01 DIAGNOSIS — R69 Illness, unspecified: Secondary | ICD-10-CM | POA: Diagnosis not present

## 2015-09-01 DIAGNOSIS — Z48817 Encounter for surgical aftercare following surgery on the skin and subcutaneous tissue: Secondary | ICD-10-CM | POA: Diagnosis not present

## 2015-09-01 DIAGNOSIS — E881 Lipodystrophy, not elsewhere classified: Secondary | ICD-10-CM | POA: Diagnosis not present

## 2015-09-21 DIAGNOSIS — E881 Lipodystrophy, not elsewhere classified: Secondary | ICD-10-CM | POA: Diagnosis not present

## 2015-09-21 DIAGNOSIS — R634 Abnormal weight loss: Secondary | ICD-10-CM | POA: Diagnosis not present

## 2015-09-22 DIAGNOSIS — D696 Thrombocytopenia, unspecified: Secondary | ICD-10-CM | POA: Diagnosis not present

## 2015-09-22 DIAGNOSIS — M4802 Spinal stenosis, cervical region: Secondary | ICD-10-CM | POA: Diagnosis not present

## 2015-09-22 DIAGNOSIS — M5481 Occipital neuralgia: Secondary | ICD-10-CM | POA: Diagnosis not present

## 2015-09-22 DIAGNOSIS — M961 Postlaminectomy syndrome, not elsewhere classified: Secondary | ICD-10-CM | POA: Diagnosis not present

## 2015-09-22 DIAGNOSIS — D691 Qualitative platelet defects: Secondary | ICD-10-CM | POA: Diagnosis not present

## 2015-09-22 DIAGNOSIS — M5412 Radiculopathy, cervical region: Secondary | ICD-10-CM | POA: Diagnosis not present

## 2015-09-22 DIAGNOSIS — M5416 Radiculopathy, lumbar region: Secondary | ICD-10-CM | POA: Diagnosis not present

## 2015-09-30 ENCOUNTER — Institutional Professional Consult (permissible substitution): Payer: Medicare HMO | Admitting: Neurology

## 2015-10-12 ENCOUNTER — Institutional Professional Consult (permissible substitution): Payer: Medicare HMO | Admitting: Neurology

## 2015-10-18 ENCOUNTER — Encounter: Payer: Self-pay | Admitting: Neurology

## 2015-10-18 ENCOUNTER — Ambulatory Visit (INDEPENDENT_AMBULATORY_CARE_PROVIDER_SITE_OTHER): Payer: Medicare HMO | Admitting: Neurology

## 2015-10-18 VITALS — BP 118/80 | HR 76 | Resp 20 | Ht 72.0 in | Wt 204.0 lb

## 2015-10-18 DIAGNOSIS — G4710 Hypersomnia, unspecified: Secondary | ICD-10-CM | POA: Diagnosis not present

## 2015-10-18 DIAGNOSIS — Z9884 Bariatric surgery status: Secondary | ICD-10-CM

## 2015-10-18 DIAGNOSIS — G4730 Sleep apnea, unspecified: Secondary | ICD-10-CM

## 2015-10-18 DIAGNOSIS — G4737 Central sleep apnea in conditions classified elsewhere: Secondary | ICD-10-CM

## 2015-10-18 DIAGNOSIS — Z79891 Long term (current) use of opiate analgesic: Secondary | ICD-10-CM

## 2015-10-18 DIAGNOSIS — Z8639 Personal history of other endocrine, nutritional and metabolic disease: Secondary | ICD-10-CM | POA: Diagnosis not present

## 2015-10-18 DIAGNOSIS — R69 Illness, unspecified: Secondary | ICD-10-CM | POA: Diagnosis not present

## 2015-10-18 DIAGNOSIS — Z9889 Other specified postprocedural states: Secondary | ICD-10-CM

## 2015-10-18 DIAGNOSIS — F1190 Opioid use, unspecified, uncomplicated: Principal | ICD-10-CM

## 2015-10-18 DIAGNOSIS — Z87898 Personal history of other specified conditions: Secondary | ICD-10-CM

## 2015-10-18 DIAGNOSIS — F513 Sleepwalking [somnambulism]: Secondary | ICD-10-CM

## 2015-10-18 DIAGNOSIS — G478 Other sleep disorders: Secondary | ICD-10-CM

## 2015-10-18 NOTE — Progress Notes (Signed)
SLEEP MEDICINE CLINIC   Provider:  Larey Seat, M D  Referring Provider: Redmond School, MD Primary Care Physician:  Glo Herring., MD   Dr.Michael Doy Mince, at [facility] is his primary neurologist.   Chief Complaint  Patient presents with  . New Patient (Initial Visit)    used to see Dr. Georgina Peer, on bipap, has used it for about 5 years, bipap is not able to be downloaded because of age, got bipap from [facility], orders supplies on [employer]bay, rm 11, alone    HPI:  NARAIN WAUGAMAN is a 55 y.o. male , seen here as a referral from Dr. Gerarda Fraction a sleep centered consultation,  Mr. 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 He is followed by the [facility] pain [facility], walks with a walker. Over 5 years ago Dr. Claiborne Billings his cardiologist at the time tested him for sleep apnea. The study was done in [place] near [facility]. At the time the patient weighted 360 pounds and was titrated to a BiPAP machine. I have here his study results available from 03/24/2010. His referring physician was Lorrin Goodell, MD. the patient had endorsed the Epworth score at 14 points consistent with excessive daytime somnolence, had loud snoring and witnessed apnea reported. He did smoke at the time and did consume alcohol. Sleep efficiency was 85.4% no REM sleep was noted AHI was 128.5 oxygen nadir was 75%. I an attempt to titrate the patient to CPAP, which failed. AHI was 86 at 12 cm water pressure. Subsequently, BiPAP was initiated 15/11 and increased to 19/14. The patient was still not able to sleep very well on these pressures as reached. He was given a full face mask ResMed Mirage quadruple in large size. He himself was able to change the mask or interface size since his weight loss to a medium size but still full face mask, and his face shows  pressure marks of last night's use. The patient has since lost a lot of weight. In December  2013 he underwent a gastric sleeve operation and he lost significant weight since. He joined the [employer].  On 08/03/2015 he underwent an abdominoplasty to remove excess skin. Dr. Abram Sander was his surgeon at [facility] 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 He walks with a walker.primary neurology at [facility], as is pain care. C7 injections and L4-5 injection therapies. Dr. Gale Journey.  Chief complaint according to patient : " I lost so much weight, I need my sleep care adjusted."   Sleep habits are as follows: He falls asleep within an hour of going to bed usually around 11 PM, he sleeps on 3 pillows. The bedroom is cool, quiet and dark. He sleeps alone. He sleeps in supine position. The patient habitually worked up between 3:30 and 5 AM at least once. He usually has to go to the bathroom and then leaves the bed because he has trouble initiating sleep again. It is mainly has sciatic nerve pain that hinders him to get more sleep and to sleep longer. He is expected to have a new treatment within the next coming days. Usually after an injection he can sleep through again to 6 or even 7 AM His daughter has witnessed him yelling in his sleep. He has been known to snore but not since he is using BiPAP. He describes  headaches that wake him out of sleep sharp stabbing headaches behind the left eye as well as occipital headaches that cause pin and needle dysesthesias at the crown. He often has a morning headache when he wakes up. He takes takes Flexeril and pain medication but increase in often too drowsy and he tries to restricted intake in daytime. He has 12 coffees during AM and walks with a walker.   Sleep medical history and family sleep history:  Sleepwalker in childhood. Father snored, died of MI at age 29    Social history: one  106 year old daughter and 3 children live with him, Divorced. One daughter has 2 children, lives in [place].  Ex-Wife remarried.     Review of Systems: Out of a complete 14 system review, the patient complains of only the following symptoms, and all other reviewed systems are negative. Medication list reviewed. It includes cyclobenzaprine, OxyContin or oxycodone, Flonase, omeprazole, Klonopin, pravastatin, sertraline.   Epworth score 7 , Fatigue severity score 55  , depression score n/a    Social History   Social History  . Marital Status: Divorced    Spouse Name: N/A  . Number of Children: 2  . Years of Education: N/A   Occupational History  . 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 Smoking status: Former Smoker -- 0.50 packs/day    Types: Cigarettes    Quit date: 06/12/2012  . Smokeless tobacco: Never Used  . Alcohol Use: No  . Drug Use: No  . Sexual Activity:    Partners: Male   Other Topics Concern  . Not on file   Social History Narrative   Moving to [place] to care for brother w/ brain tumor   Lives w/ 2 daughters & 4 grandsons    Family History  Problem Relation Age of Onset  . Colon cancer Paternal Grandmother   . Brain cancer Brother 49  . Heart attack Father     Past Medical History  Diagnosis Date  . DJD (degenerative joint disease)   . Spinal stenosis   . Hypertension   . Obesity   . Thrombocytopenia ([place])   . H/O echocardiogram 2012    for cad, htn & obesity  . H/O cardiovascular stress test 2013  . Anxiety     Past Surgical History  Procedure Laterality Date  . Laparoscopic gastric sleeve resection  06/2012    Dr Mickie Bail [facility])  . Breast reduction surgery    . Back surgery      x 4  . Colonoscopy N/A 10/14/2012    [id number] rectum and colon  . Flexible sigmoidoscopy N/A 10/15/2013    Procedure: FLEXIBLE SIGMOIDOSCOPY;  Surgeon: Daneil Dolin, MD;  Location: [facility];  Service: Endoscopy;  Laterality: N/A;  8:30  . H/o cardiac cath    . Cardiac catheterization  2011    5 frech sheath    Current Outpatient Prescriptions   Medication Sig Dispense Refill  . acetaminophen (TYLENOL) 500 MG tablet Take 1,000 mg by mouth every 6 (six) hours as needed.    . AMITIZA 24 MCG capsule TAKE 1 CAPSULE TWICE DAILY WITH FOOD. 60 capsule 11  . calcium citrate-vitamin D (CITRACAL+D) 315-200 MG-UNIT per tablet Take 1 tablet by mouth 2 (two) times daily.    . cetirizine (ZYRTEC) 10 MG tablet Take 10 mg by mouth as needed for allergies.    . cholecalciferol (VITAMIN D) 400 UNITS TABS tablet Take 400  Units by mouth daily.    . clonazePAM (KLONOPIN) 1 MG tablet Take 1 mg by mouth at bedtime as needed for anxiety (Sleep).     . Flaxseed, Linseed, (FLAX SEED OIL PO) Take 1 tablet by mouth daily.     Marland Kitchen losartan (COZAAR) 50 MG tablet Take 50 mg by mouth daily.     . Multiple Vitamin (MULTIVITAMIN) capsule Take 2 capsules by mouth daily.    Marland Kitchen omeprazole (PRILOSEC) 20 MG capsule Take 20 mg by mouth daily.     . Oxycodone HCl 10 MG TABS Every 4-6 hours.    . pravastatin (PRAVACHOL) 40 MG tablet Take 40 mg by mouth daily.    . sertraline (ZOLOFT) 100 MG tablet Take 100 mg by mouth daily.     . vitamin B-12 (CYANOCOBALAMIN) 1000 MCG tablet Take 1,000 mcg by mouth daily.    Marland Kitchen zinc gluconate 50 MG tablet Take 50 mg by mouth daily.     No current facility-administered medications for this visit.    Allergies as of 10/18/2015 - Review Complete 10/18/2015  Allergen Reaction Noted  . Aspirin Nausea And Vomiting 03/27/2014  . Codeine Nausea And Vomiting and Nausea Only 10/14/2012  . Nsaids Anxiety 07/27/2013    Vitals: BP 118/80 mmHg  Pulse 76  Resp 20  Ht 6' (1.829 m)  Wt 204 lb (92.534 kg)  BMI 27.66 kg/m2 Last Weight:  Wt Readings from Last 1 Encounters:  10/18/15 204 lb (92.534 kg)   [id number] mass index is 27.66 kg/(m^2).     Last Height:   Ht Readings from Last 1 Encounters:  10/18/15 6' (1.829 m)    Physical exam:  General: The patient is awake, alert and appears not in acute distress. The patient is well groomed. Head:  Normocephalic, atraumatic. Neck is supple. Mallampati 3  neck circumference:16. Nasal airflow unrestricted , TMJ click evident . Retrognathia is not seen. Bruxism reported. Cardiovascular:  Regular rate and rhythm, without  murmurs or carotid bruit, and without distended neck veins. Respiratory: Lungs are clear to auscultation. Skin:  Without evidence of edema, or rash Trunk:  Neurologic exam : The patient is awake and alert, oriented to place and time.   Memory subjective described as intact.   Attention span & concentration ability appears normal.  Speech is fluent,  without dysarthria, dysphonia or aphasia.  Mood and affect are appropriate.  Cranial nerves: Pupils are equal and briskly reactive to light. Funduscopic exam without evidence of pallor or edema.  Extraocular movements  in vertical and horizontal planes intact and without nystagmus. Visual fields by finger perimetry are intact. Hearing to finger rub intact. Facial sensation intact to fine touch. Facial motor strength is symmetric and tongue and uvula move midline. Shoulder shrug was symmetrical.   Motor exam:  Normal tone, muscle bulk and symmetric strength in all extremities. Sensory:  Fine touch, pinprick and vibration were tested in all extremities. Proprioception tested in the upper extremities was normal. Coordination: Rapid alternating movements in the fingers/hands was normal. Finger-to-nose maneuver  normal without evidence of ataxia, dysmetria or tremor. Gait and station: Patient walks with walker , not able unassisted to climb up to the exam table. Turns with 4 Steps. Romberg testing is positive.  Deep tendon reflexes: in the upper and lower extremities are asymmetric, left patella absent achilles absent . Babinski maneuver response is downgoing.  The patient was advised of the nature of the diagnosed sleep disorder , the treatment options and risks for general a  health and wellness arising from not treating the  condition.  I spent more than 45 minutes of face to face time with the patient. Greater than 50% of time was spent in counseling and coordination of care. We have discussed the diagnosis and differential and I answered the patient's questions.     Assessment:  After physical and neurologic examination, review of laboratory studies,  Personal review of imaging studies, reports of other /same  Imaging studies ,  Results of polysomnography/ neurophysiology testing and pre-existing records as far as provided in visit., my assessment is   1) The patient will need to undergo a full night attended study- see his report of central apneas, Co2 retention. He lost weight .   2) Mr. Bridenbaugh 's next pain treatment should be performed after his next pain treatment. This way we can hope for some sleep to be recorded.   3) The patient will need to be tested for  #1 the presence of sleep apnea  #2 central versus obstructive sleep apnea  #3 if apnea is found titration to BiPAP or CPAP device.   Unfortunately after 5 years he will need to undergo a baseline study again to document the presence or absence of apnea. Given that his pain is limiting his sleep efficiency at night I rather wait for the sleep study to be performed after his next pain treatment. He was originally diagnosed with obstructive sleep apnea but CPAP titration cost him to have central apneas. Very high AHI's were noted at the time. I will share my results with his primary care physician and was his [place] neurologist and pain - specialists.    Plan:  Treatment plan and additional workup :   SPLIT night study after next Thursday , the 30st.  The patient needs not to bring his machine , it is not downloadable.  Asencion Partridge Tumeka Chimenti MD  10/18/2015    I thank Dr Gerarda Fraction for this referral , and will keep him updated with the developments.   CC: Redmond School, [address] [place], [place] [address]

## 2015-10-19 ENCOUNTER — Encounter: Payer: Self-pay | Admitting: *Deleted

## 2015-10-21 DIAGNOSIS — D696 Thrombocytopenia, unspecified: Secondary | ICD-10-CM | POA: Diagnosis not present

## 2015-10-21 DIAGNOSIS — M5416 Radiculopathy, lumbar region: Secondary | ICD-10-CM | POA: Diagnosis not present

## 2015-10-21 DIAGNOSIS — D691 Qualitative platelet defects: Secondary | ICD-10-CM | POA: Diagnosis not present

## 2015-10-25 DIAGNOSIS — D696 Thrombocytopenia, unspecified: Secondary | ICD-10-CM | POA: Diagnosis not present

## 2015-10-26 DIAGNOSIS — Z9889 Other specified postprocedural states: Secondary | ICD-10-CM | POA: Diagnosis not present

## 2015-10-26 DIAGNOSIS — G5622 Lesion of ulnar nerve, left upper limb: Secondary | ICD-10-CM | POA: Diagnosis not present

## 2015-10-26 DIAGNOSIS — E881 Lipodystrophy, not elsewhere classified: Secondary | ICD-10-CM | POA: Diagnosis not present

## 2015-10-26 DIAGNOSIS — R634 Abnormal weight loss: Secondary | ICD-10-CM | POA: Diagnosis not present

## 2015-10-26 DIAGNOSIS — R69 Illness, unspecified: Secondary | ICD-10-CM | POA: Diagnosis not present

## 2015-11-12 ENCOUNTER — Other Ambulatory Visit: Payer: Self-pay | Admitting: Nurse Practitioner

## 2015-11-29 ENCOUNTER — Ambulatory Visit (INDEPENDENT_AMBULATORY_CARE_PROVIDER_SITE_OTHER): Payer: Medicare HMO | Admitting: Neurology

## 2015-11-29 DIAGNOSIS — Z9884 Bariatric surgery status: Secondary | ICD-10-CM

## 2015-11-29 DIAGNOSIS — G4710 Hypersomnia, unspecified: Secondary | ICD-10-CM | POA: Diagnosis not present

## 2015-11-29 DIAGNOSIS — Z87898 Personal history of other specified conditions: Secondary | ICD-10-CM

## 2015-11-29 DIAGNOSIS — G4730 Sleep apnea, unspecified: Secondary | ICD-10-CM

## 2015-11-29 DIAGNOSIS — G4737 Central sleep apnea in conditions classified elsewhere: Secondary | ICD-10-CM | POA: Diagnosis not present

## 2015-11-29 DIAGNOSIS — G478 Other sleep disorders: Secondary | ICD-10-CM

## 2015-11-29 DIAGNOSIS — Z8639 Personal history of other endocrine, nutritional and metabolic disease: Secondary | ICD-10-CM

## 2015-11-29 DIAGNOSIS — F513 Sleepwalking [somnambulism]: Secondary | ICD-10-CM

## 2015-11-29 DIAGNOSIS — F1190 Opioid use, unspecified, uncomplicated: Principal | ICD-10-CM

## 2015-12-06 DIAGNOSIS — E663 Overweight: Secondary | ICD-10-CM | POA: Diagnosis not present

## 2015-12-06 DIAGNOSIS — R42 Dizziness and giddiness: Secondary | ICD-10-CM | POA: Diagnosis not present

## 2015-12-06 DIAGNOSIS — Z1389 Encounter for screening for other disorder: Secondary | ICD-10-CM | POA: Diagnosis not present

## 2015-12-06 DIAGNOSIS — Z6828 Body mass index (BMI) 28.0-28.9, adult: Secondary | ICD-10-CM | POA: Diagnosis not present

## 2015-12-09 ENCOUNTER — Telehealth: Payer: Self-pay

## 2015-12-09 NOTE — Telephone Encounter (Signed)
I spoke to pt regarding his sleep study results. I advised him that his study did not reveal significant sleep apnea or PLMS of sleep resulting in significant sleep disruption. Parasomnia activity was noted. Pt was advised to not drive or operate hazardous machinery when sleepy. Pt verbalized understanding. I advised pt to avoid caffeine containing beverages and chocolate. Pt wishes for a follow up appt to discuss. An appt was made for 8/7 at 9:30. Pt verbalized understanding. Pt asked that I fax a copy of his results to Dr. Gerarda Fraction, pt's PCP and referring physician. Pt verbalized understanding of his results. Pt had no questions at this time but was encouraged to call back if questions arise.

## 2015-12-14 DIAGNOSIS — M5412 Radiculopathy, cervical region: Secondary | ICD-10-CM | POA: Diagnosis not present

## 2015-12-14 DIAGNOSIS — D691 Qualitative platelet defects: Secondary | ICD-10-CM | POA: Diagnosis not present

## 2015-12-21 DIAGNOSIS — M5412 Radiculopathy, cervical region: Secondary | ICD-10-CM | POA: Diagnosis not present

## 2015-12-21 DIAGNOSIS — D691 Qualitative platelet defects: Secondary | ICD-10-CM | POA: Diagnosis not present

## 2015-12-21 DIAGNOSIS — M5416 Radiculopathy, lumbar region: Secondary | ICD-10-CM | POA: Diagnosis not present

## 2015-12-21 DIAGNOSIS — M961 Postlaminectomy syndrome, not elsewhere classified: Secondary | ICD-10-CM | POA: Diagnosis not present

## 2016-01-03 ENCOUNTER — Ambulatory Visit (INDEPENDENT_AMBULATORY_CARE_PROVIDER_SITE_OTHER): Payer: Medicare HMO | Admitting: Adult Health

## 2016-01-03 ENCOUNTER — Encounter: Payer: Self-pay | Admitting: Adult Health

## 2016-01-03 VITALS — BP 112/78 | HR 68 | Ht 72.0 in | Wt 210.0 lb

## 2016-01-03 DIAGNOSIS — R072 Precordial pain: Secondary | ICD-10-CM

## 2016-01-03 DIAGNOSIS — I10 Essential (primary) hypertension: Secondary | ICD-10-CM | POA: Diagnosis not present

## 2016-01-03 DIAGNOSIS — Z79899 Other long term (current) drug therapy: Secondary | ICD-10-CM

## 2016-01-03 NOTE — Progress Notes (Deleted)
Name: Jerome [name]    DOB: May 30, 1961  Age: 55 y.o.  MR#: [id number]       PCP:  Glo Herring., MD      Insurance: Payor: [employer] Falling MEDICARE / Plan: [employer] MEDICARE HMO/PPO / Product Type: *No Product type* /   CC:   No chief complaint on file.   VS Filed Vitals:   01/03/16 1453  BP: 112/78  Pulse: 68  Height: 6' (1.829 m)  Weight: 210 lb (95.255 kg)  SpO2: 98%    Weights Current Weight  01/03/16 210 lb (95.255 kg)  10/18/15 204 lb (92.534 kg)  09/14/14 250 lb (113.399 kg)    Blood Pressure  BP Readings from Last 3 Encounters:  01/03/16 112/78  10/18/15 118/80  06/06/15 110/74     Admit date:  (Not on file) Last encounter with RMR:  Visit date not found   Allergy Aspirin; Codeine; and Nsaids  Current Outpatient Prescriptions  Medication Sig Dispense Refill  . acetaminophen (TYLENOL) 500 MG tablet Take 1,000 mg by mouth every 6 (six) hours as needed.    . AMITIZA 24 MCG capsule TAKE 1 CAPSULE BY MOUTH TWICE DAILY WITH FOOD 60 capsule 5  . calcium citrate-vitamin D (CITRACAL+D) 315-200 MG-UNIT per tablet Take 1 tablet by mouth 2 (two) times daily.    . cetirizine (ZYRTEC) 10 MG tablet Take 10 mg by mouth as needed for allergies.    . cholecalciferol (VITAMIN D) 400 UNITS TABS tablet Take 400 Units by mouth daily.    . clonazePAM (KLONOPIN) 1 MG tablet Take 1 mg by mouth 2 (two) times daily.     . cyclobenzaprine (FLEXERIL) 5 MG tablet Take by mouth 3 (three) times daily as needed.     . Flaxseed, Linseed, (FLAX SEED OIL PO) Take 1 tablet by mouth daily.     Marland Kitchen losartan (COZAAR) 25 MG tablet Take 25 mg by mouth daily.    . Multiple Vitamin (MULTIVITAMIN WITH MINERALS) TABS tablet Take 1 tablet by mouth daily.    Marland Kitchen omeprazole (PRILOSEC) 20 MG capsule Take 20 mg by mouth daily.     . Oxycodone HCl 10 MG TABS Every 4-6 hours.    . pravastatin (PRAVACHOL) 40 MG tablet Take 40 mg by mouth daily.    . sertraline (ZOLOFT) 100 MG tablet Take 100 mg by mouth daily.     . vitamin  B-12 (CYANOCOBALAMIN) 1000 MCG tablet Take 1,000 mcg by mouth daily.    Marland Kitchen zinc gluconate 50 MG tablet Take 50 mg by mouth daily.     No current facility-administered medications for this visit.    Discontinued Meds:    Medications Discontinued During This Encounter  Medication Reason  . clonazePAM (KLONOPIN) 1 MG tablet Error  . Multiple Vitamin (MULTIVITAMIN) capsule Error  . losartan (COZAAR) 50 MG tablet Error    Patient Active Problem List   Diagnosis Date Noted  . 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 H/O gastric bypass 10/18/2015  . Hypersomnia with sleep apnea 10/18/2015  . Sleep talking 10/18/2015  . Sleep walking and eating 10/18/2015  . Varicose veins of leg with complications [id number]  . Rectal pain 10/10/2013  . Rectal bleeding 10/10/2013  . Unspecified constipation 10/10/2013  . Fecal incontinence 10/10/2013  . Heme positive stool 10/10/2013  . OSA on CPAP 08/25/2013  . HIV exposure 07/29/2013  . Encounter for screening colonoscopy 10/10/2012  . Hemorrhoids 10/10/2012    LABS    Component  Value Date/Time   NA 140 07/27/2013 1128   K 4.7 07/27/2013 1128   CL 103 07/27/2013 1128   CO2 25 07/27/2013 1128   GLUCOSE 94 07/27/2013 1128   BUN 15 07/27/2013 1128   CREATININE 0.64 07/27/2013 1128   CALCIUM 9.8 07/27/2013 1128   GFRNONAA >90 07/27/2013 1128   GFRAA >90 07/27/2013 1128   CMP     Component Value Date/Time   NA 140 07/27/2013 1128   K 4.7 07/27/2013 1128   CL 103 07/27/2013 1128   CO2 25 07/27/2013 1128   GLUCOSE 94 07/27/2013 1128   BUN 15 07/27/2013 1128   CREATININE 0.64 07/27/2013 1128   CALCIUM 9.8 07/27/2013 1128   PROT 7.5 07/27/2013 1128   ALBUMIN 4.4 07/27/2013 1128   AST 20 07/27/2013 1128   ALT 21 07/27/2013 1128   ALKPHOS 87 07/27/2013 1128   BILITOT 0.8 07/27/2013 1128   GFRNONAA >90 07/27/2013 1128   GFRAA >90 07/27/2013 1128       Component Value Date/Time   WBC 6.9 07/27/2013 1128    HGB 17.8* 07/27/2013 1128   HCT 49.6 07/27/2013 1128   MCV 86.7 07/27/2013 1128    Lipid Panel  No results found for: CHOL, TRIG, HDL, CHOLHDL, VLDL, LDLCALC, LDLDIRECT  ABG No results found for: PHART, PCO2ART, PO2ART, HCO3, TCO2, ACIDBASEDEF, O2SAT   No results found for: TSH BNP (last 3 results) No results for input(s): BNP in the last 8760 hours.  ProBNP (last 3 results) No results for input(s): PROBNP in the last 8760 hours.  Cardiac Panel (last 3 results) No results for input(s): CKTOTAL, CKMB, TROPONINI, RELINDX in the last 72 hours.  Iron/TIBC/Ferritin/ %Sat No results found for: IRON, TIBC, FERRITIN, IRONPCTSAT   EKG Orders placed or performed in visit on 01/03/16  . EKG 12-Lead     Prior Assessment and Plan Problem List as of 01/03/2016      Cardiovascular and Mediastinum   Hemorrhoids   Last Assessment & Plan 10/10/2012 Office Visit Written 10/10/2012  7:38 PM by Andria Meuse, NP    Anusol HC supp 1 PR BID Further evaluation with colonoscopy.      Varicose veins of leg with complications     Respiratory   OSA on CPAP     Digestive   Rectal bleeding   Unspecified constipation     Nervous and Auditory   Sleep walking and eating     Other   Encounter for screening colonoscopy   Last Assessment & Plan 10/10/2012 Office Visit Written 10/10/2012  7:37 PM by Andria Meuse, NP    Ander Gaster is a pleasant 55 y.o. male due for screening colonoscopy with Dr Gala Romney. I have discussed risks & benefits which include, but are not limited to, bleeding, infection, perforation & drug reaction.  The patient agrees with this plan & written consent will be obtained.   Phenergan 25mg  IV 30 minutes prior to procedure to augment sedation given hx of chronic narcotics, anxiolytics & alcohol        HIV exposure   Last Assessment & Plan 08/25/2013 Office Visit Edited 08/25/2013 10:23 AM by Campbell Riches, MD    Will check his HIV RNA and ab tests. He is doing well,  will complete his meds in 2 days. Will return to clinic prn. Needs repeat lab in 6 months.       Rectal pain   Last Assessment & Plan 10/10/2013 Office Visit Written 10/10/2013  2:19  PM by Mahala Menghini, PA-C    55 year old gentleman with at least 2 month history of rectal discomfort associated with rectal bleeding, constipation, history of anal receptor during sexual encounter (07/2013). Complains of more long-term intermittent fecal incontinence. Recent bowel change from regular stools to constipation. Patient is on chronic narcotic therapy but states that he had typically been able to manage with daily bowel movements before. On exam he is tender rectal exam, poor anal sphincter tone, questionable mild prolapse. He was Hemoccult-positive. Would be concerned about possibility of anorectal fissure as well as other etiology. He has small palpable lesions within the anal canal possibly hemorrhoids. Cannot exclude some degree of prolapse or proctitis. Discussed at length with the patient he was quite anxious about his condition. Rectum appeared normal on colonoscopy over one year ago. At this point in time we'll manage as anorectal fissure with nitroglycerin ointment, hydrocortisone for possible hemorrhoids. We'll recommend flexible sigmoidoscopy for more diagnostic purposes and to determine course of treatment for patient given chronicity and severity of his ongoing symptoms. Suspect patient would not tolerate anoscope with possible hemorrhoid banding in the office given current discomfort. I have discussed the risks, alternatives, benefits with regards to but not limited to the risk of reaction to medication, bleeding, infection, perforation and the patient is agreeable to proceed. Written consent to be obtained.  Add amitiza 47mcg BID to constipation.        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

## 2016-01-03 NOTE — Patient Instructions (Signed)
Medication Instructions:  Your physician recommends that you continue on your current medications as directed. Please refer to the Current Medication list given to you today.   Labwork: Your physician recommends that you return for lab work in: [place]    Testing/Procedures: NONE  Follow-Up: Your physician recommends that you schedule a follow-up appointment in: [place] [facility]    Any Other Special Instructions Will Be Listed Below (If Applicable).     If you need a refill on your cardiac medications before your next appointment, please call your pharmacy.

## 2016-01-03 NOTE — Progress Notes (Signed)
Cardiology Office Note   Date:  01/03/2016   ID:  BLANCH DYKHUIZEN, DOB 05-Nov-1960, MRN [id number]  PCP:  Glo Herring., MD  Cardiologist: Woodroe Chen, NP   Chief Complaint  Patient presents with  . Chest Pain      History of Present Illness: Jerome [name] is a 55 y.o. male who presents for ongoing assessment and management of hypertension. Hx of morbid obesity with sleeve gastrectomy, tobacco abuse, varicose veins, and hyperlipidemia. He was last seen by Dr. Bronson Ing on 03/27/2014. He was to follow up prn. He requested appointment for chronic chest pain. He had a normal stress test on October of 2013. He had normal cath with normal coronaries in 2011.   He comes today with complaints of pinching chest pain on the left side. He states that it was there unrelenting for several hours. He did not have associated diaphoresis dyspnea or weakness. Patient has some anxiety associated with discomfort in his chest. He admits he worries too much about his pain.  Past Medical History  Diagnosis Date  . DJD (degenerative joint disease)   . Spinal stenosis   . Hypertension   . Obesity   . Thrombocytopenia ([place])   . H/O echocardiogram 2012    for cad, htn & obesity  . H/O cardiovascular stress test 2013  . Anxiety     Past Surgical History  Procedure Laterality Date  . Laparoscopic gastric sleeve resection  06/2012    Dr Mickie Bail [facility])  . Breast reduction surgery    . Back surgery      x 4  . Colonoscopy N/A 10/14/2012    [id number] rectum and colon  . Flexible sigmoidoscopy N/A 10/15/2013    Procedure: FLEXIBLE SIGMOIDOSCOPY;  Surgeon: Daneil Dolin, MD;  [facility];  Service: Endoscopy;  Laterality: N/A;  8:30  . H/o cardiac cath    . Cardiac catheterization  2011    5 frech sheath     Current Outpatient Prescriptions  Medication Sig Dispense Refill  . acetaminophen (TYLENOL) 500 MG tablet Take 1,000 mg by mouth every 6 (six) hours as needed.     . AMITIZA 24 MCG capsule TAKE 1 CAPSULE BY MOUTH TWICE DAILY WITH FOOD 60 capsule 5  . calcium citrate-vitamin D (CITRACAL+D) 315-200 MG-UNIT per tablet Take 1 tablet by mouth 2 (two) times daily.    . cetirizine (ZYRTEC) 10 MG tablet Take 10 mg by mouth as needed for allergies.    . cholecalciferol (VITAMIN D) 400 UNITS TABS tablet Take 400 Units by mouth daily.    . clonazePAM (KLONOPIN) 1 MG tablet Take 1 mg by mouth 2 (two) times daily.     . cyclobenzaprine (FLEXERIL) 5 MG tablet Take by mouth 3 (three) times daily as needed.     . Flaxseed, Linseed, (FLAX SEED OIL PO) Take 1 tablet by mouth daily.     Marland Kitchen losartan (COZAAR) 25 MG tablet Take 25 mg by mouth daily.    . Multiple Vitamin (MULTIVITAMIN WITH MINERALS) TABS tablet Take 1 tablet by mouth daily.    Marland Kitchen omeprazole (PRILOSEC) 20 MG capsule Take 20 mg by mouth daily.     . Oxycodone HCl 10 MG TABS Every 4-6 hours.    . pravastatin (PRAVACHOL) 40 MG tablet Take 40 mg by mouth daily.    . sertraline (ZOLOFT) 100 MG tablet Take 100 mg by mouth daily.     . vitamin B-12 (CYANOCOBALAMIN) 1000 MCG tablet Take 1,000  mcg by mouth daily.    Marland Kitchen zinc gluconate 50 MG tablet Take 50 mg by mouth daily.     No current facility-administered medications for this visit.    Allergies:   Aspirin; Codeine; and Nsaids    Social History:  The patient  reports that he quit smoking about 3 years ago. His smoking use included Cigarettes. He smoked 0.50 packs per day. He has never used smokeless tobacco. He reports that he does not drink alcohol or use illicit drugs.   Family History:  The patient's family history includes Brain cancer (age of onset: 73) in his brother; Colon cancer in his paternal grandmother; Heart attack in his father.    ROS: All other systems are reviewed and negative. Unless otherwise mentioned in H&P    PHYSICAL EXAM: VS:  BP 112/78 mmHg  Pulse 68  Ht 6' (1.829 m)  Wt 210 lb (95.255 kg)  BMI 28.47 kg/m2  SpO2 98% , BMI Body  mass index is 28.47 kg/(m^2). GEN: Well nourished, well developed, in no acute distress HEENT: normal Neck: no JVD, carotid bruits, or masses Cardiac: RRR; no murmurs, rubs, or gallops,no edema  Respiratory:  clear to auscultation bilaterally, normal work of breathing GI: soft, nontender, nondistended, + BS MS: no deformity or atrophylordosis is noted with mild scoliosis. The patient uses a cane for ambulation. Skin: warm and dry, no rash Neuro:  Strength and sensation are intact Psych: euthymic mood, full affect   EKG:  The ekg ordered today demonstrates normal sinus rhythm   Recent Labs: No results found for requested labs within last 365 days.    Lipid Panel No results found for: CHOL, TRIG, HDL, CHOLHDL, VLDL, LDLCALC, LDLDIRECT    Wt Readings from Last 3 Encounters:  01/03/16 210 lb (95.255 kg)  10/18/15 204 lb (92.534 kg)  09/14/14 250 lb (113.399 kg)     ASSESSMENT AND PLAN:  1.  Chronic chest pain: this appears to be atypical in etiology lasting for several hours, described as pinching, not associated with shortness of breath or diaphoresis. Review of last stress test and catheterization revealed normal coronary arteries and normal perfusion. Do to use a PPI, I will check a BMET and a magnesium to evaluate potassium and status and magnesium status. This can contribute to some musculoskeletal pain if low. He will followup with Dr. Bronson Ing in one month.  2. Hypertension:blood pressure is stable currently. We'll not make any changes in medication regimen at this time.   Current medicines are reviewed at length with the patient today.    Labs/ tests ordered today include: BMET magnesium  Orders Placed This Encounter  Procedures  . Basic Metabolic Panel (BMET)  . Magnesium  . EKG 12-Lead     Disposition:   FU with one month Dr.Koneswaran   Signed, Jory Sims, NP  01/03/2016 3:37 PM    [facility] [address]  [place], [place] [address] Phone: [phone]; Fax: [phone]

## 2016-01-04 LAB — BASIC METABOLIC PANEL
BUN: 17 mg/dL (ref 7–25)
CALCIUM: 9.2 mg/dL (ref 8.6–10.3)
CHLORIDE: 103 mmol/L (ref 98–110)
CO2: 28 mmol/L (ref 20–31)
CREATININE: 0.73 mg/dL (ref 0.70–1.33)
Glucose, Bld: 73 mg/dL (ref 65–99)
Potassium: 4.6 mmol/L (ref 3.5–5.3)
Sodium: 139 mmol/L (ref 135–146)

## 2016-01-04 LAB — MAGNESIUM: MAGNESIUM: 1.8 mg/dL (ref 1.5–2.5)

## 2016-02-28 ENCOUNTER — Telehealth: Payer: Self-pay

## 2016-02-28 ENCOUNTER — Ambulatory Visit: Payer: Self-pay | Admitting: Neurology

## 2016-02-28 NOTE — Telephone Encounter (Signed)
I spoke to pt and advised him that his appt today with Dr. Brett Fairy needs to be cancelled because she is out of the office sick. Pt is agreeable to a 03/14/16 appt at 9:00. Pt verbalized understanding.

## 2016-03-01 DIAGNOSIS — Z6827 Body mass index (BMI) 27.0-27.9, adult: Secondary | ICD-10-CM | POA: Diagnosis not present

## 2016-03-01 DIAGNOSIS — Z0001 Encounter for general adult medical examination with abnormal findings: Secondary | ICD-10-CM | POA: Diagnosis not present

## 2016-03-01 DIAGNOSIS — Z1389 Encounter for screening for other disorder: Secondary | ICD-10-CM | POA: Diagnosis not present

## 2016-03-01 DIAGNOSIS — I10 Essential (primary) hypertension: Secondary | ICD-10-CM | POA: Diagnosis not present

## 2016-03-01 DIAGNOSIS — E782 Mixed hyperlipidemia: Secondary | ICD-10-CM | POA: Diagnosis not present

## 2016-03-02 ENCOUNTER — Encounter: Payer: Self-pay | Admitting: Cardiovascular Disease

## 2016-03-02 ENCOUNTER — Ambulatory Visit (INDEPENDENT_AMBULATORY_CARE_PROVIDER_SITE_OTHER): Payer: Medicare HMO | Admitting: Cardiovascular Disease

## 2016-03-02 VITALS — BP 116/76 | HR 79 | Ht 72.0 in | Wt 200.0 lb

## 2016-03-02 DIAGNOSIS — I8393 Asymptomatic varicose veins of bilateral lower extremities: Secondary | ICD-10-CM

## 2016-03-02 DIAGNOSIS — I10 Essential (primary) hypertension: Secondary | ICD-10-CM

## 2016-03-02 DIAGNOSIS — R072 Precordial pain: Secondary | ICD-10-CM

## 2016-03-02 DIAGNOSIS — E785 Hyperlipidemia, unspecified: Secondary | ICD-10-CM

## 2016-03-02 NOTE — Patient Instructions (Signed)
Your physician recommends that you schedule a follow-up appointment in: as needed   Thank you for choosing [facility] !         

## 2016-03-02 NOTE — Progress Notes (Signed)
SUBJECTIVE: Pt returns for fu. I haven't evaluated him since 03/2014. Has h/o venous varicosities. HTN, OSA, and hyperlipidemia.  Saw K. Lawrence for chest pain in 12/2015.  Had normal cath on 03/11/2010.  Labs checked in June and were unremarkable (normal Mg, BMET).  Pain was finally alleviated after spine injection.  Review of Systems: As per "subjective", otherwise negative.  Allergies  Allergen Reactions  . Aspirin Nausea And Vomiting    Due to gastric sleeve surgery   . Codeine Nausea And Vomiting and Nausea Only  . Nsaids Anxiety    Due to gastric sleeve surgery  N/V    Current Outpatient Prescriptions  Medication Sig Dispense Refill  . acetaminophen (TYLENOL) 500 MG tablet Take 1,000 mg by mouth every 6 (six) hours as needed.    . AMITIZA 24 MCG capsule TAKE 1 CAPSULE BY MOUTH TWICE DAILY WITH FOOD 60 capsule 5  . calcium citrate-vitamin D (CITRACAL+D) 315-200 MG-UNIT per tablet Take 1 tablet by mouth 2 (two) times daily.    . cetirizine (ZYRTEC) 10 MG tablet Take 10 mg by mouth as needed for allergies.    . cholecalciferol (VITAMIN D) 400 UNITS TABS tablet Take 400 Units by mouth daily.    . clonazePAM (KLONOPIN) 1 MG tablet Take 1 mg by mouth 2 (two) times daily.     . cyclobenzaprine (FLEXERIL) 5 MG tablet Take by mouth 3 (three) times daily as needed.     . Flaxseed, Linseed, (FLAX SEED OIL PO) Take 1 tablet by mouth daily.     Marland Kitchen losartan (COZAAR) 25 MG tablet Take 25 mg by mouth daily.    . Multiple Vitamin (MULTIVITAMIN WITH MINERALS) TABS tablet Take 1 tablet by mouth daily.    Marland Kitchen omeprazole (PRILOSEC) 20 MG capsule Take 20 mg by mouth daily.     . Oxycodone HCl 10 MG TABS Every 4-6 hours.    . pravastatin (PRAVACHOL) 40 MG tablet Take 40 mg by mouth daily.    . sertraline (ZOLOFT) 100 MG tablet Take 100 mg by mouth daily.     . vitamin B-12 (CYANOCOBALAMIN) 1000 MCG tablet Take 1,000 mcg by mouth daily.    Marland Kitchen zinc gluconate 50 MG tablet Take 50 mg by mouth  daily.     No current facility-administered medications for this visit.     Past Medical History:  Diagnosis Date  . Anxiety   . DJD (degenerative joint disease)   . H/O cardiovascular stress test 2013  . H/O echocardiogram 2012   for cad, htn & obesity  . Hypertension   . Obesity   . Spinal stenosis   . Thrombocytopenia ([place])     Past Surgical History:  Procedure Laterality Date  . BACK SURGERY     x 4  . BREAST REDUCTION SURGERY    . CARDIAC CATHETERIZATION  2011   5 frech sheath  . COLONOSCOPY N/A 10/14/2012   [id number] rectum and colon  . FLEXIBLE SIGMOIDOSCOPY N/A 10/15/2013   Procedure: FLEXIBLE SIGMOIDOSCOPY;  Surgeon: Daneil Dolin, MD;  [facility];  Service: Endoscopy;  Laterality: N/A;  8:30  . h/o cardiac cath    . LAPAROSCOPIC GASTRIC SLEEVE RESECTION  06/2012   Dr Mickie Bail [facility])    Social History   Social History  . Marital status: Divorced    Spouse name: N/A  . Number of children: 2  . Years of education: N/A   Occupational History  . retired; [employer] ([place]    Social History Main Topics  . Smoking status: Former Smoker    Packs/day: 0.50    Types: Cigarettes    Quit date: 06/12/2012  . Smokeless tobacco: Never Used  . Alcohol use No  . Drug use: No  . Sexual activity: Yes    Partners: Male   Other Topics Concern  . Not on file   Social History Narrative   Moving to [place] to care for brother w/ brain tumor   Lives w/ 2 daughters & 4 grandsons     Vitals:   03/02/16 0939  BP: 116/76  Pulse: 79  SpO2: 95%  Weight: 200 lb (90.7 kg)  Height: 6' (1.829 m)    PHYSICAL EXAM General: NAD HEENT: Normal. Neck: No JVD, no thyromegaly. Lungs: Clear to auscultation bilaterally with normal respiratory effort. CV: Nondisplaced PMI.  Regular rate and rhythm, normal S1/S2, no S3/S4, no murmur. No pretibial or periankle edema.   Abdomen: Soft, nontender, no distention.  Neurologic: Alert and oriented.  Psych:  Normal affect. Skin: Normal.     ECG: Most recent ECG reviewed.      ASSESSMENT AND PLAN: 1. Essential HTN: Controlled on present therapy. No changes.  2. Hyperlipidemia: Taking pravastatin 40 mg daily.  3. Chest pain: Noncardiac. No further workup needed.  Dispo: fu prn.   Kate Sable, M.D., F.A.C.C.

## 2016-03-07 DIAGNOSIS — D696 Thrombocytopenia, unspecified: Secondary | ICD-10-CM | POA: Diagnosis not present

## 2016-03-07 DIAGNOSIS — M5417 Radiculopathy, lumbosacral region: Secondary | ICD-10-CM | POA: Diagnosis not present

## 2016-03-07 DIAGNOSIS — M25412 Effusion, left shoulder: Secondary | ICD-10-CM | POA: Diagnosis not present

## 2016-03-07 DIAGNOSIS — M5412 Radiculopathy, cervical region: Secondary | ICD-10-CM | POA: Diagnosis not present

## 2016-03-14 ENCOUNTER — Encounter: Payer: Self-pay | Admitting: Neurology

## 2016-03-14 ENCOUNTER — Ambulatory Visit (INDEPENDENT_AMBULATORY_CARE_PROVIDER_SITE_OTHER): Payer: Medicare HMO | Admitting: Neurology

## 2016-03-14 VITALS — BP 110/80 | HR 72 | Resp 20 | Ht 72.0 in | Wt 201.0 lb

## 2016-03-14 DIAGNOSIS — G8929 Other chronic pain: Secondary | ICD-10-CM | POA: Diagnosis not present

## 2016-03-14 DIAGNOSIS — G4701 Insomnia due to medical condition: Secondary | ICD-10-CM

## 2016-03-14 DIAGNOSIS — M5417 Radiculopathy, lumbosacral region: Secondary | ICD-10-CM | POA: Diagnosis not present

## 2016-03-14 DIAGNOSIS — D691 Qualitative platelet defects: Secondary | ICD-10-CM | POA: Diagnosis not present

## 2016-03-14 NOTE — Patient Instructions (Signed)
alpha lipoic acid, B 12 and Vit D with calcium should be continued.  You need not to continue with BiPAP or CPAP therapy .

## 2016-03-14 NOTE — Progress Notes (Signed)
SLEEP MEDICINE CLINIC   Provider:  Larey Seat, M D  Referring Provider: Redmond School, MD Primary Care Physician:  Jerome [name]., MD   Dr.Michael Doy Mince, at [facility] is his primary neurologist.   Chief Complaint  [name] presents with  . Follow-up    discuss sleep study results    HPI:  Jerome [name] is a 55 y.o. male , seen here as a referral from Dr. 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 He is followed by [facility], walks with a walker. Over 5 years ago Dr. Claiborne Billings his cardiologist at Jerome time tested him for sleep apnea. Jerome study was done in [place] near [facility]. At Jerome time Jerome [name] weighted 360 pounds and was titrated to a BiPAP machine. I have here his study results available from 03/24/2010. His referring physician was Jerome Goodell, MD. Jerome [name] had endorsed Jerome Epworth score at 14 points consistent with excessive daytime somnolence, had loud snoring and witnessed apnea reported. He did smoke at Jerome time and did consume alcohol. Sleep efficiency was 85.4% no REM sleep was noted AHI was 128.5 oxygen nadir was 75%. I an attempt to titrate Jerome [name] to CPAP, which failed. AHI was 86 at 12 cm water pressure. Subsequently, BiPAP was initiated 15/11 and increased to 19/14. Jerome [name] was still not able to sleep very well on these pressures as reached. He was given a full face mask ResMed Mirage quadruple in large size. He himself was able to change Jerome mask or interface size since his weight loss to a medium size but still full face mask, and his face shows  pressure marks of last night's use. Jerome [name] has since lost a lot of weight. In December 2013 he underwent a gastric sleeve operation and he lost significant weight since. He joined [facility] [employer].  On 08/03/2015 he underwent an abdominoplasty to remove excess  skin. Dr. Abram Sander was his surgeon at [facility]. 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 He walks with a walker.primary neurology at [facility], as is pain care. C7 injections and L4-5 injection therapies. Dr. Gale Journey.  Chief complaint according to [name] : " I lost so much weight, I need my sleep care adjusted."   Sleep habits are as follows: He falls asleep within an hour of going to bed usually around 11 PM, he sleeps on 3 pillows. Jerome bedroom is cool, quiet and dark. He sleeps alone. He sleeps in supine position. Jerome [name] habitually worked up between 3:30 and 5 AM at least once. He usually has to go to Jerome bathroom and then leaves Jerome bed because he has trouble initiating sleep again. It is mainly has sciatic nerve pain that hinders him to get more sleep and to sleep longer. He is expected to have a new treatment within Jerome next coming days. Usually after an injection he can sleep through again to 6 or even 7 AM His daughter has witnessed him yelling in his sleep. He has been known to snore but not since he is using BiPAP. He describes headaches that wake him out of sleep sharp stabbing headaches behind Jerome left eye as well as occipital headaches that cause pin and needle dysesthesias at Jerome crown. He often has a morning headache  when he wakes up. He takes takes Flexeril and pain medication but increase in often too drowsy and he tries to restricted intake in daytime.He has 12 coffees during AM and walks with a walker.  Unfortunately after 5 years he will need to undergo a baseline study again- to document Jerome presence or absence of apnea. Given that his pain is limiting his sleep efficiency at night I rather wait for Jerome sleep study to be performed after his next pain treatment. He was originally diagnosed with obstructive sleep apnea but CPAP titration cost him to have central apneas. Very high AHI's were noted at Jerome time. I will share my  results with his primary care physician and was his [facility] neurologist and pain - specialists.  Interval history on 03/14/2016. Jerome [name] underwent a sleep study on 11/29/2015 he presented with an Epworth score of 7 points a BMI of 28 and neck circumference of 16 inches. He had lost a lot of weight since last and tested for apnea and Jerome question was if he still needs apnea treatment. His AHI was 5.3 it was marginally increased during REM sleep to 8.9 and all apneas were in supine position with a supine AHI of 6.3 versus a nonsupine of 0.0. Based on this result CPAP does not need to be used any longer. It is possible that Jerome [name] who has been using CPAP for decade would like to continue it but it is at this time not medically indicated. Because of back pain Jerome [name] prefers a supine sleep position, with a wedge and multiple pillows. He has pain interrupting his sleep not apnea. His sleepiness score is not elevated but currently at 9 points fatigue severity at 45 points, he only gets an average of about 5 hours of sleep. All related to pain. He will follow his pain management clinic at [facility].     Sleep medical history and family sleep history:  Sleepwalker in childhood. Father snored, died of MI at age 45   Social history: one 16 year old daughter and her 3 children live with him, Divorced. Another daughter has 2 children, lives in [place].    Review of Systems: Out of a complete 14 system review, Jerome [name] complains of only Jerome following symptoms, and all other reviewed systems are negative. Medication list reviewed. It includes cyclobenzaprine, OxyContin or oxycodone, Flonase, omeprazole, Klonopin, pravastatin, sertraline. Epworth score 9 , Fatigue severity score 45  , depression score n/a    Social History   Social History  . Marital status: Divorced    Spouse name: N/A  . Number of children: 2  . Years of education: N/A   Occupational History  . 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 Smoking status: Former Smoker    Packs/day: 0.50    Types: Cigarettes    Quit date: 06/12/2012  . Smokeless tobacco: Never Used  . Alcohol use No  . Drug use: No  . Sexual activity: Yes    Partners: Male   Other Topics Concern  . Not on file   Social History Narrative   Moving to [place] to care for brother w/ brain tumor   Lives w/ 2 daughters & 4 grandsons    Family History  Problem Relation Age of Onset  . Colon cancer Paternal Grandmother   . Brain cancer Brother 68  . Heart attack Father     Past Medical History:  Diagnosis Date  . Anxiety   .  DJD (degenerative joint disease)   . H/O cardiovascular stress test 2013  . H/O echocardiogram 2012   for cad, htn & obesity  . Hypertension   . Obesity   . Spinal stenosis   . Thrombocytopenia ([place])     Past Surgical History:  Procedure Laterality Date  . BACK SURGERY     x 4  . BREAST REDUCTION SURGERY    . CARDIAC CATHETERIZATION  2011   5 frech sheath  . COLONOSCOPY N/A 10/14/2012   [id number] rectum and colon  . FLEXIBLE SIGMOIDOSCOPY N/A 10/15/2013   Procedure: FLEXIBLE SIGMOIDOSCOPY;  Surgeon: Daneil Dolin, MD;  Location: [facility] ENDO SUITE;  Service: Endoscopy;  Laterality: N/A;  8:30  . h/o cardiac cath    . LAPAROSCOPIC GASTRIC SLEEVE RESECTION  06/2012   Dr Mickie Bail [facility])    Current Outpatient Prescriptions  Medication Sig Dispense Refill  . acetaminophen (TYLENOL) 500 MG tablet Take 1,000 mg by mouth every 6 (six) hours as needed.    . Alpha-Lipoic Acid 600 MG CAPS Take by mouth.    . AMITIZA 24 MCG capsule TAKE 1 CAPSULE BY MOUTH TWICE DAILY WITH FOOD 60 capsule 5  . calcium citrate-vitamin D (CITRACAL+D) 315-200 MG-UNIT per tablet Take 1 tablet by mouth 2 (two) times daily.    . cetirizine (ZYRTEC) 10 MG tablet Take 10 mg by mouth as needed for allergies.    . cholecalciferol (VITAMIN D) 400 UNITS TABS tablet Take 400 Units by mouth daily.    . clonazePAM (KLONOPIN) 1 MG  tablet Take 1 mg by mouth 2 (two) times daily.     . cyclobenzaprine (FLEXERIL) 5 MG tablet Take by mouth 3 (three) times daily as needed.     . Flaxseed, Linseed, (FLAX SEED OIL PO) Take 1 tablet by mouth daily.     Marland Kitchen losartan (COZAAR) 25 MG tablet Take 25 mg by mouth daily.    . Multiple Vitamin (MULTIVITAMIN WITH MINERALS) TABS tablet Take 1 tablet by mouth daily.    Marland Kitchen omeprazole (PRILOSEC) 20 MG capsule Take 20 mg by mouth daily.     . Oxycodone HCl 10 MG TABS Every 4-6 hours.    . pravastatin (PRAVACHOL) 40 MG tablet Take 40 mg by mouth daily.    . sertraline (ZOLOFT) 100 MG tablet Take 100 mg by mouth daily.     . vitamin B-12 (CYANOCOBALAMIN) 1000 MCG tablet Take 1,000 mcg by mouth daily.    Marland Kitchen zinc gluconate 50 MG tablet Take 50 mg by mouth daily.     No current facility-administered medications for this visit.     Allergies as of 03/14/2016 - Review Complete 03/14/2016  Allergen Reaction Noted  . Aspirin Nausea And Vomiting 03/27/2014  . Codeine Nausea And Vomiting and Nausea Only 10/14/2012  . Nsaids Anxiety 07/27/2013    Vitals: BP 110/80   Pulse 72   Resp 20   Ht 6' (1.829 m)   Wt 201 lb (91.2 kg)   BMI 27.26 kg/m  Last Weight:  Wt Readings from Last 1 Encounters:  03/14/16 201 lb (91.2 kg)   [id number] mass index is 27.26 kg/m.     Last Height:   Ht Readings from Last 1 Encounters:  03/14/16 6' (1.829 m)    Physical exam:  General: Jerome [name] is awake, alert and appears not in acute distress. Jerome [name] is well groomed. Head: Normocephalic, atraumatic. Neck is supple. Mallampati 3  neck circumference:16. Nasal airflow unrestricted , TMJ click evident .  Retrognathia is not seen. Bruxism reported. Cardiovascular:  Regular rate and rhythm, without  murmurs or carotid bruit, and without distended neck veins. Respiratory: Lungs are clear to auscultation. Skin:  Without evidence of edema, or rash Trunk:  Neurologic exam : Jerome [name] is awake and alert, oriented  to place and time.   Memory subjective described as intact.   Attention span & concentration ability appears normal.  Speech is fluent,  without dysarthria, dysphonia or aphasia.  Mood and affect are appropriate.  Cranial nerves: Pupils are equal and briskly reactive to light. Funduscopic exam without evidence of pallor or edema.  Extraocular movements  in vertical and horizontal planes intact and without nystagmus. Visual fields by finger perimetry are intact. Hearing to finger rub intact. Facial sensation intact to fine touch. Facial motor strength is symmetric and tongue and uvula move midline. Shoulder shrug was symmetrical.   Motor exam:  Normal tone, muscle bulk and symmetric strength in all extremities. Sensory:  Fine touch, pinprick and vibration were tested in all extremities. Proprioception tested in Jerome upper extremities was normal. Coordination: Rapid alternating movements in Jerome fingers/hands was normal. Finger-to-nose maneuver  normal without evidence of ataxia, dysmetria or tremor. Gait and station: [name] walks with walker , not able unassisted to climb up to Jerome exam table. Turns with 4 Steps. Romberg testing is positive.  Deep tendon reflexes: in Jerome upper and lower extremities are asymmetric, left patella absent achilles absent . Babinski maneuver response is downgoing.  Jerome [name] was advised of Jerome nature of Jerome diagnosed sleep disorder , Jerome treatment options and risks for general a health and wellness arising from not treating Jerome condition.  I spent more than 45 minutes of face to face time with Jerome [name]. Greater than 50% of time was spent in counseling and coordination of care. We have discussed Jerome diagnosis and differential and I answered Jerome [name]'s questions.     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   Plan:  Treatment plan and additional workup :  Return to pain management. PCP has copy of sleep study.   Asencion Partridge Analuisa Tudor MD  03/14/2016    CC: Redmond School, [employer] [place], [place] [address]

## 2016-04-11 DIAGNOSIS — D691 Qualitative platelet defects: Secondary | ICD-10-CM | POA: Diagnosis not present

## 2016-04-11 DIAGNOSIS — M5412 Radiculopathy, cervical region: Secondary | ICD-10-CM | POA: Diagnosis not present

## 2016-04-13 DIAGNOSIS — M5417 Radiculopathy, lumbosacral region: Secondary | ICD-10-CM | POA: Diagnosis not present

## 2016-04-13 DIAGNOSIS — M4802 Spinal stenosis, cervical region: Secondary | ICD-10-CM | POA: Diagnosis not present

## 2016-04-13 DIAGNOSIS — M961 Postlaminectomy syndrome, not elsewhere classified: Secondary | ICD-10-CM | POA: Diagnosis not present

## 2016-04-13 DIAGNOSIS — M5412 Radiculopathy, cervical region: Secondary | ICD-10-CM | POA: Diagnosis not present

## 2016-04-14 DIAGNOSIS — Z23 Encounter for immunization: Secondary | ICD-10-CM | POA: Diagnosis not present

## 2016-04-20 DIAGNOSIS — M5412 Radiculopathy, cervical region: Secondary | ICD-10-CM | POA: Diagnosis not present

## 2016-04-20 DIAGNOSIS — M5481 Occipital neuralgia: Secondary | ICD-10-CM | POA: Diagnosis not present

## 2016-04-20 DIAGNOSIS — M4802 Spinal stenosis, cervical region: Secondary | ICD-10-CM | POA: Diagnosis not present

## 2016-05-25 DIAGNOSIS — D691 Qualitative platelet defects: Secondary | ICD-10-CM | POA: Diagnosis not present

## 2016-05-25 DIAGNOSIS — M4726 Other spondylosis with radiculopathy, lumbar region: Secondary | ICD-10-CM | POA: Diagnosis not present

## 2016-05-25 DIAGNOSIS — D696 Thrombocytopenia, unspecified: Secondary | ICD-10-CM | POA: Diagnosis not present

## 2016-05-25 DIAGNOSIS — D693 Immune thrombocytopenic purpura: Secondary | ICD-10-CM | POA: Diagnosis not present

## 2016-05-25 DIAGNOSIS — R69 Illness, unspecified: Secondary | ICD-10-CM | POA: Diagnosis not present

## 2016-06-08 DIAGNOSIS — M4726 Other spondylosis with radiculopathy, lumbar region: Secondary | ICD-10-CM | POA: Diagnosis not present

## 2016-06-08 DIAGNOSIS — M48061 Spinal stenosis, lumbar region without neurogenic claudication: Secondary | ICD-10-CM | POA: Diagnosis not present

## 2016-06-08 DIAGNOSIS — M5116 Intervertebral disc disorders with radiculopathy, lumbar region: Secondary | ICD-10-CM | POA: Diagnosis not present

## 2016-06-09 DIAGNOSIS — H5210 Myopia, unspecified eye: Secondary | ICD-10-CM | POA: Diagnosis not present

## 2016-06-19 DIAGNOSIS — R69 Illness, unspecified: Secondary | ICD-10-CM | POA: Diagnosis not present

## 2016-06-19 DIAGNOSIS — M5412 Radiculopathy, cervical region: Secondary | ICD-10-CM | POA: Diagnosis not present

## 2016-06-19 DIAGNOSIS — M5417 Radiculopathy, lumbosacral region: Secondary | ICD-10-CM | POA: Diagnosis not present

## 2016-06-19 DIAGNOSIS — M961 Postlaminectomy syndrome, not elsewhere classified: Secondary | ICD-10-CM | POA: Diagnosis not present

## 2016-06-19 DIAGNOSIS — M5481 Occipital neuralgia: Secondary | ICD-10-CM | POA: Diagnosis not present

## 2016-06-19 DIAGNOSIS — Z5181 Encounter for therapeutic drug level monitoring: Secondary | ICD-10-CM | POA: Diagnosis not present

## 2016-06-19 DIAGNOSIS — M4726 Other spondylosis with radiculopathy, lumbar region: Secondary | ICD-10-CM | POA: Diagnosis not present

## 2016-06-19 DIAGNOSIS — M1288 Other specific arthropathies, not elsewhere classified, other specified site: Secondary | ICD-10-CM | POA: Diagnosis not present

## 2016-06-22 DIAGNOSIS — D696 Thrombocytopenia, unspecified: Secondary | ICD-10-CM | POA: Diagnosis not present

## 2016-06-22 DIAGNOSIS — M545 Low back pain: Secondary | ICD-10-CM | POA: Diagnosis not present

## 2016-06-22 DIAGNOSIS — G8929 Other chronic pain: Secondary | ICD-10-CM | POA: Diagnosis not present

## 2016-06-22 DIAGNOSIS — M47816 Spondylosis without myelopathy or radiculopathy, lumbar region: Secondary | ICD-10-CM | POA: Diagnosis not present

## 2016-06-23 DIAGNOSIS — R69 Illness, unspecified: Secondary | ICD-10-CM | POA: Diagnosis not present

## 2016-06-23 DIAGNOSIS — F418 Other specified anxiety disorders: Secondary | ICD-10-CM | POA: Diagnosis not present

## 2016-07-06 DIAGNOSIS — L723 Sebaceous cyst: Secondary | ICD-10-CM | POA: Diagnosis not present

## 2016-07-07 DIAGNOSIS — M1991 Primary osteoarthritis, unspecified site: Secondary | ICD-10-CM | POA: Diagnosis not present

## 2016-07-07 DIAGNOSIS — R69 Illness, unspecified: Secondary | ICD-10-CM | POA: Diagnosis not present

## 2016-07-07 DIAGNOSIS — Z6829 Body mass index (BMI) 29.0-29.9, adult: Secondary | ICD-10-CM | POA: Diagnosis not present

## 2016-07-07 DIAGNOSIS — E663 Overweight: Secondary | ICD-10-CM | POA: Diagnosis not present

## 2016-07-07 DIAGNOSIS — M519 Unspecified thoracic, thoracolumbar and lumbosacral intervertebral disc disorder: Secondary | ICD-10-CM | POA: Diagnosis not present

## 2016-07-07 DIAGNOSIS — Z1389 Encounter for screening for other disorder: Secondary | ICD-10-CM | POA: Diagnosis not present

## 2016-07-19 DIAGNOSIS — Z1389 Encounter for screening for other disorder: Secondary | ICD-10-CM | POA: Diagnosis not present

## 2016-07-19 DIAGNOSIS — Z6828 Body mass index (BMI) 28.0-28.9, adult: Secondary | ICD-10-CM | POA: Diagnosis not present

## 2016-07-19 DIAGNOSIS — E663 Overweight: Secondary | ICD-10-CM | POA: Diagnosis not present

## 2016-07-19 DIAGNOSIS — R1903 Right lower quadrant abdominal swelling, mass and lump: Secondary | ICD-10-CM | POA: Diagnosis not present

## 2016-07-25 DIAGNOSIS — M7521 Bicipital tendinitis, right shoulder: Secondary | ICD-10-CM | POA: Diagnosis not present

## 2016-07-25 DIAGNOSIS — D691 Qualitative platelet defects: Secondary | ICD-10-CM | POA: Diagnosis not present

## 2016-07-25 DIAGNOSIS — M7522 Bicipital tendinitis, left shoulder: Secondary | ICD-10-CM | POA: Diagnosis not present

## 2016-07-28 DIAGNOSIS — M47816 Spondylosis without myelopathy or radiculopathy, lumbar region: Secondary | ICD-10-CM | POA: Diagnosis not present

## 2016-08-02 DIAGNOSIS — L723 Sebaceous cyst: Secondary | ICD-10-CM | POA: Diagnosis not present

## 2016-08-08 ENCOUNTER — Ambulatory Visit (HOSPITAL_COMMUNITY)
Admission: RE | Admit: 2016-08-08 | Discharge: 2016-08-08 | Disposition: A | Discharge status: Home / Self Care | Payer: Medicare HMO | Source: Ambulatory Visit | Attending: Surgery | Admitting: Surgery

## 2016-08-08 ENCOUNTER — Encounter (HOSPITAL_COMMUNITY)
Admission: RE | Disposition: A | Discharge status: Home / Self Care | Payer: Self-pay | Source: Ambulatory Visit | Attending: Surgery

## 2016-08-08 ENCOUNTER — Encounter (HOSPITAL_COMMUNITY): Admission: RE | Admit: 2016-08-08 | Payer: Medicare HMO | Source: Ambulatory Visit

## 2016-08-08 DIAGNOSIS — Z87891 Personal history of nicotine dependence: Secondary | ICD-10-CM | POA: Insufficient documentation

## 2016-08-08 DIAGNOSIS — I10 Essential (primary) hypertension: Secondary | ICD-10-CM | POA: Insufficient documentation

## 2016-08-08 DIAGNOSIS — Z9884 Bariatric surgery status: Secondary | ICD-10-CM | POA: Insufficient documentation

## 2016-08-08 DIAGNOSIS — L720 Epidermal cyst: Secondary | ICD-10-CM | POA: Insufficient documentation

## 2016-08-08 DIAGNOSIS — R222 Localized swelling, mass and lump, trunk: Secondary | ICD-10-CM | POA: Diagnosis not present

## 2016-08-08 DIAGNOSIS — R1900 Intra-abdominal and pelvic swelling, mass and lump, unspecified site: Secondary | ICD-10-CM | POA: Diagnosis present

## 2016-08-08 HISTORY — PX: CYST EXCISION: SHX5701

## 2016-08-08 LAB — CBC
HCT: 47.4 % (ref 39.0–52.0)
Hemoglobin: 15.6 g/dL (ref 13.0–17.0)
MCH: 27.9 pg (ref 26.0–34.0)
MCHC: 32.9 g/dL (ref 30.0–36.0)
MCV: 84.6 fL (ref 78.0–100.0)
PLATELETS: 119 10*3/uL — AB (ref 150–400)
RBC: 5.6 MIL/uL (ref 4.22–5.81)
RDW: 13.8 % (ref 11.5–15.5)
WBC: 5.1 10*3/uL (ref 4.0–10.5)

## 2016-08-08 SURGERY — CYST REMOVAL
Anesthesia: LOCAL | Site: Inguinal | Laterality: Right

## 2016-08-08 MED ORDER — BUPIVACAINE HCL (PF) 0.5 % IJ SOLN
INTRAMUSCULAR | Status: AC
  Filled 2016-08-08: qty 30

## 2016-08-08 MED ORDER — CHLORHEXIDINE GLUCONATE CLOTH 2 % EX PADS: 6.0000 | MEDICATED_PAD | Freq: Once | CUTANEOUS | Status: DC

## 2016-08-08 MED ORDER — LIDOCAINE HCL 1 % IJ SOLN
INTRAMUSCULAR | Status: DC | PRN
  Administered 2016-08-08 (×2): 10 mL

## 2016-08-08 MED ORDER — CEFAZOLIN SODIUM-DEXTROSE 2-4 GM/100ML-% IV SOLN
2.0000 g | INTRAVENOUS | Status: AC
  Filled 2016-08-08: qty 100

## 2016-08-08 SURGICAL SUPPLY — 19 items
BAG HAMPER (MISCELLANEOUS) ×2 IMPLANT
BLADE SURG 15 STRL LF DISP TIS (BLADE) IMPLANT
BLADE SURG 15 STRL SS (BLADE) ×2
CLOTH BEACON ORANGE TIMEOUT ST (SAFETY) ×2 IMPLANT
DURAPREP 26ML APPLICATOR (WOUND CARE) ×2 IMPLANT
FORMALIN 10 PREFIL 120ML (MISCELLANEOUS) ×2 IMPLANT
GAUZE SPONGE 4X4 12PLY STRL (GAUZE/BANDAGES/DRESSINGS) ×2 IMPLANT
GLOVE BIOGEL PI IND STRL 7.5 (GLOVE) ×1 IMPLANT
GLOVE BIOGEL PI INDICATOR 7.5 (GLOVE) ×1
GLOVE ECLIPSE 7.0 STRL STRAW (GLOVE) ×2 IMPLANT
GLOVE EXAM NITRILE MD LF STRL (GLOVE) ×1 IMPLANT
GOWN STRL REUS W/TWL LRG LVL3 (GOWN DISPOSABLE) ×4 IMPLANT
NS IRRIG 1000ML POUR BTL (IV SOLUTION) ×1 IMPLANT
PAD ARMBOARD 7.5X6 YLW CONV (MISCELLANEOUS) ×2 IMPLANT
SOL PREP PROV IODINE SCRUB 4OZ (MISCELLANEOUS) IMPLANT
SUT MNCRL AB 4-0 PS2 18 (SUTURE) ×2 IMPLANT
SUT PROLENE 3 0 PS 1 (SUTURE) IMPLANT
SUT VIC AB 3-0 SH 27 (SUTURE) ×6
SUT VIC AB 3-0 SH 27X BRD (SUTURE) IMPLANT
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
## 2016-08-08 NOTE — Op Note (Signed)
SURGICAL OPERATIVE REPORT  DATE OF PROCEDURE: 08/08/2016  ATTENDING Surgeon(s): Vickie Epley, MD  ANESTHESIA: Local  PRE-OPERATIVE DIAGNOSIS: Symptomatic (painful, intermittently draining) Right inguinal sebaceous cyst (icd-10: L72.3)  POST-OPERATIVE DIAGNOSIS: Symptomatic (painful, intermittently draining) Right inguinal subcutaneous mass (icd-10: R22.2)  PROCEDURE(S):  1.) Excision of symptomatic (painful, intermittently draining) subcutaneous Right inguinal mass (cpt: [id number])  INTRAOPERATIVE FINDINGS: 7 cm long (combined) x 2 cm wide x 4 cm deep not visibly infected subcutaneous Right inguinal mass extending to depth of fascia (still possibly a sebaceous cyst, but associated with a venous plexus overlying fascia and with a small amount of clear non-caseous fluid expressed), removed intact  INTRAVENOUS FLUIDS: 0 mL crystalloid   ESTIMATED BLOOD LOSS: Minimal (< 20 mL)  URINE OUTPUT: No Foley  SPECIMENS: 7 cm long (combined) x 2 cm wide x 4 cm deep not visibly infected subcutaneous Right inguinal mass extending to depth of fascia (still possibly a sebaceous cyst, but associated with a venous plexus overlying fascia and with a small amount of clear non-caseous fluid expressed), removed intact  IMPLANTS: None  DRAINS: None  COMPLICATIONS: None apparent  CONDITION AT END OF PROCEDURE: Hemodynamically stable and awake  DISPOSITION OF PATIENT: PACU  INDICATIONS FOR PROCEDURE:  Patient is a 56 y.o. male who presented for a symptomatic (painful, intermittently draining, previously infected) Right inguinal subcutaneous mass, at which time patient was told it was a sebaceous cyst and referred for surgical evaluation and management. Patient reports he first noticed the mass(es) this past summer, since which time it has grown in size, became inflamed/infected, drained, and regressed following antibiotic therapy. Patient requested that it be removed so activities can be performed  with less discomfort.All risks, benefits, and alternatives to above procedure were discussed with the patient, all of patient's questions were answered to his expressed satisfaction, and informed consent was obtained and documented.  DETAILS OF PROCEDURE: Patient was brought to the procedure suite and was appropriately identified. Laying on the procedural bed in supine position, operative site was prepped and draped in the usual sterile fashion, and following a brief time out, a 7 cm long and 2 cm wide transverse elliptical incision was made using a #15 blade scalpel, and incision was extended deep around subcutaneous mass to fascia using sharp + blunt dissection. During the course of dissecting free the mass, it was not disrupted and was removed intact. Direct pressure was applied for hemostasis, and an additional piece of what appeared to be the mass was excised. Further examination did not reveal any additional mass. Hemostasis was achieved, and the wound was copiously irrigated with sterile unused local anesthetic. Deep tissue and dermis were re-approximated using buried interrupted 3-0 Vicryl suture, and running subcuticular 4-0 Monocryl suture was used to re-approximate epidermis. Skin was then cleaned and dried, and sterile Dermabond skin glue was applied to the wound. Patient was then safely transferred to recovery for post-procedural monitoring and care. I was present for all aspects of the above procedure, and there were no complications apparent.

## 2016-08-08 NOTE — Interval H&P Note (Signed)
History and Physical Interval Note:  08/08/2016 8:40 AM  Jerome [name]  has presented today for surgery, with the diagnosis of sebaceous cyst right groin  The various methods of treatment have been discussed with the patient and family. After consideration of risks, benefits and other options for treatment, the patient has consented to  Procedure(s): EXCISION OF RIGHT INGUINAL SUBCUTANEOUS MASS (LIKELY SEBACEIYS CYST) (Right) as a surgical intervention .  The patient's history has been reviewed, patient examined, no change in status, stable for surgery.  I have reviewed the patient's chart and labs.  Questions were answered to the patient's satisfaction.     Vickie Epley

## 2016-08-08 NOTE — Discharge Instructions (Addendum)
In addition to included general post-operative instructions for Excision of Right Inguinal Subcutaneous Mass,  Diet: Resume home heart healthy diet.   Activity: No heavy lifting >20 pounds (children, pets, laundry, garbage) or strenuous activity until follow-up, but light activity and walking are encouraged. Do not drive or drink alcohol if taking narcotic pain medications.  Wound care: 2 days after surgery (Thursday, 1/18), may shower/get incision wet with soapy water and pat dry (do not rub incisions), but no baths or submerging incision underwater until follow-up.   Medications: Resume all home medications. For mild to moderate pain: acetaminophen (Tylenol) or ibuprofen (if no kidney disease). Narcotic pain medications, if prescribed, can be used for severe pain, though may cause nausea, constipation, and drowsiness. Do not combine Tylenol and Percocet within a 6 hour period as Percocet contains Tylenol. If you do not need the narcotic pain medication, you do not need to fill the prescription.  Call office [phone]) at any time if any questions, worsening pain, fevers/chills, bleeding, drainage from incision site, or other concerns.

## 2016-08-10 ENCOUNTER — Encounter (HOSPITAL_COMMUNITY): Payer: Self-pay | Admitting: Surgery

## 2016-09-14 DIAGNOSIS — R202 Paresthesia of skin: Secondary | ICD-10-CM | POA: Diagnosis not present

## 2016-09-14 DIAGNOSIS — R2681 Unsteadiness on feet: Secondary | ICD-10-CM | POA: Diagnosis not present

## 2016-09-14 DIAGNOSIS — M5412 Radiculopathy, cervical region: Secondary | ICD-10-CM | POA: Diagnosis not present

## 2016-09-14 DIAGNOSIS — M5416 Radiculopathy, lumbar region: Secondary | ICD-10-CM | POA: Diagnosis not present

## 2016-09-14 DIAGNOSIS — M5417 Radiculopathy, lumbosacral region: Secondary | ICD-10-CM | POA: Diagnosis not present

## 2016-09-14 DIAGNOSIS — R200 Anesthesia of skin: Secondary | ICD-10-CM | POA: Diagnosis not present

## 2016-09-14 DIAGNOSIS — D691 Qualitative platelet defects: Secondary | ICD-10-CM | POA: Diagnosis not present

## 2016-09-18 DIAGNOSIS — M7521 Bicipital tendinitis, right shoulder: Secondary | ICD-10-CM | POA: Diagnosis not present

## 2016-09-18 DIAGNOSIS — M4726 Other spondylosis with radiculopathy, lumbar region: Secondary | ICD-10-CM | POA: Diagnosis not present

## 2016-09-18 DIAGNOSIS — M7522 Bicipital tendinitis, left shoulder: Secondary | ICD-10-CM | POA: Diagnosis not present

## 2016-09-18 DIAGNOSIS — M5417 Radiculopathy, lumbosacral region: Secondary | ICD-10-CM | POA: Diagnosis not present

## 2016-09-18 DIAGNOSIS — M47816 Spondylosis without myelopathy or radiculopathy, lumbar region: Secondary | ICD-10-CM | POA: Diagnosis not present

## 2016-09-18 DIAGNOSIS — Z5181 Encounter for therapeutic drug level monitoring: Secondary | ICD-10-CM | POA: Diagnosis not present

## 2016-09-18 DIAGNOSIS — G8929 Other chronic pain: Secondary | ICD-10-CM | POA: Diagnosis not present

## 2016-09-18 DIAGNOSIS — M545 Low back pain: Secondary | ICD-10-CM | POA: Diagnosis not present

## 2016-10-11 DIAGNOSIS — G5622 Lesion of ulnar nerve, left upper limb: Secondary | ICD-10-CM | POA: Diagnosis not present

## 2016-10-11 DIAGNOSIS — G5601 Carpal tunnel syndrome, right upper limb: Secondary | ICD-10-CM | POA: Diagnosis not present

## 2016-10-11 DIAGNOSIS — M5412 Radiculopathy, cervical region: Secondary | ICD-10-CM | POA: Diagnosis not present

## 2016-10-19 DIAGNOSIS — D2372 Other benign neoplasm of skin of left lower limb, including hip: Secondary | ICD-10-CM | POA: Diagnosis not present

## 2016-10-19 DIAGNOSIS — D225 Melanocytic nevi of trunk: Secondary | ICD-10-CM | POA: Diagnosis not present

## 2016-10-19 DIAGNOSIS — L821 Other seborrheic keratosis: Secondary | ICD-10-CM | POA: Diagnosis not present

## 2016-10-25 DIAGNOSIS — F418 Other specified anxiety disorders: Secondary | ICD-10-CM | POA: Diagnosis not present

## 2016-10-25 DIAGNOSIS — R69 Illness, unspecified: Secondary | ICD-10-CM | POA: Diagnosis not present

## 2016-10-26 ENCOUNTER — Other Ambulatory Visit: Payer: Self-pay | Admitting: Nurse Practitioner

## 2016-10-30 DIAGNOSIS — D693 Immune thrombocytopenic purpura: Secondary | ICD-10-CM | POA: Diagnosis not present

## 2016-10-30 DIAGNOSIS — D6959 Other secondary thrombocytopenia: Secondary | ICD-10-CM | POA: Diagnosis not present

## 2016-11-01 DIAGNOSIS — R292 Abnormal reflex: Secondary | ICD-10-CM | POA: Diagnosis not present

## 2016-11-01 DIAGNOSIS — M62838 Other muscle spasm: Secondary | ICD-10-CM | POA: Diagnosis not present

## 2016-11-01 DIAGNOSIS — R69 Illness, unspecified: Secondary | ICD-10-CM | POA: Diagnosis not present

## 2016-11-01 DIAGNOSIS — R159 Full incontinence of feces: Secondary | ICD-10-CM | POA: Diagnosis not present

## 2016-11-01 DIAGNOSIS — J309 Allergic rhinitis, unspecified: Secondary | ICD-10-CM | POA: Diagnosis not present

## 2016-11-01 DIAGNOSIS — Z79899 Other long term (current) drug therapy: Secondary | ICD-10-CM | POA: Diagnosis not present

## 2016-11-01 DIAGNOSIS — R51 Headache: Secondary | ICD-10-CM | POA: Diagnosis not present

## 2016-11-01 DIAGNOSIS — Z7951 Long term (current) use of inhaled steroids: Secondary | ICD-10-CM | POA: Diagnosis not present

## 2016-11-01 DIAGNOSIS — K5900 Constipation, unspecified: Secondary | ICD-10-CM | POA: Diagnosis not present

## 2016-11-01 DIAGNOSIS — Z87891 Personal history of nicotine dependence: Secondary | ICD-10-CM | POA: Diagnosis not present

## 2016-11-01 DIAGNOSIS — Z87828 Personal history of other (healed) physical injury and trauma: Secondary | ICD-10-CM | POA: Diagnosis not present

## 2016-11-01 DIAGNOSIS — G8929 Other chronic pain: Secondary | ICD-10-CM | POA: Diagnosis not present

## 2016-11-01 DIAGNOSIS — Z0000 Encounter for general adult medical examination without abnormal findings: Secondary | ICD-10-CM | POA: Diagnosis not present

## 2016-11-01 DIAGNOSIS — I10 Essential (primary) hypertension: Secondary | ICD-10-CM | POA: Diagnosis not present

## 2016-11-01 DIAGNOSIS — R531 Weakness: Secondary | ICD-10-CM | POA: Diagnosis not present

## 2016-11-01 DIAGNOSIS — K219 Gastro-esophageal reflux disease without esophagitis: Secondary | ICD-10-CM | POA: Diagnosis not present

## 2016-11-01 DIAGNOSIS — M4800 Spinal stenosis, site unspecified: Secondary | ICD-10-CM | POA: Diagnosis not present

## 2016-11-01 DIAGNOSIS — E7800 Pure hypercholesterolemia, unspecified: Secondary | ICD-10-CM | POA: Diagnosis not present

## 2016-11-14 DIAGNOSIS — M5412 Radiculopathy, cervical region: Secondary | ICD-10-CM | POA: Diagnosis not present

## 2016-11-14 DIAGNOSIS — R69 Illness, unspecified: Secondary | ICD-10-CM | POA: Diagnosis not present

## 2016-11-14 DIAGNOSIS — M47816 Spondylosis without myelopathy or radiculopathy, lumbar region: Secondary | ICD-10-CM | POA: Diagnosis not present

## 2016-11-14 DIAGNOSIS — Z5181 Encounter for therapeutic drug level monitoring: Secondary | ICD-10-CM | POA: Diagnosis not present

## 2016-11-14 DIAGNOSIS — M5417 Radiculopathy, lumbosacral region: Secondary | ICD-10-CM | POA: Diagnosis not present

## 2016-11-14 DIAGNOSIS — G894 Chronic pain syndrome: Secondary | ICD-10-CM | POA: Diagnosis not present

## 2016-11-14 DIAGNOSIS — M5481 Occipital neuralgia: Secondary | ICD-10-CM | POA: Diagnosis not present

## 2016-11-16 DIAGNOSIS — M48062 Spinal stenosis, lumbar region with neurogenic claudication: Secondary | ICD-10-CM | POA: Diagnosis not present

## 2016-11-16 DIAGNOSIS — N529 Male erectile dysfunction, unspecified: Secondary | ICD-10-CM | POA: Diagnosis not present

## 2016-11-16 DIAGNOSIS — Z1389 Encounter for screening for other disorder: Secondary | ICD-10-CM | POA: Diagnosis not present

## 2016-11-16 DIAGNOSIS — Z6828 Body mass index (BMI) 28.0-28.9, adult: Secondary | ICD-10-CM | POA: Diagnosis not present

## 2016-11-20 DIAGNOSIS — D696 Thrombocytopenia, unspecified: Secondary | ICD-10-CM | POA: Diagnosis not present

## 2016-12-21 DIAGNOSIS — R2681 Unsteadiness on feet: Secondary | ICD-10-CM | POA: Diagnosis not present

## 2016-12-21 DIAGNOSIS — M21372 Foot drop, left foot: Secondary | ICD-10-CM | POA: Diagnosis not present

## 2016-12-21 DIAGNOSIS — M5412 Radiculopathy, cervical region: Secondary | ICD-10-CM | POA: Diagnosis not present

## 2016-12-21 DIAGNOSIS — G43719 Chronic migraine without aura, intractable, without status migrainosus: Secondary | ICD-10-CM | POA: Diagnosis not present

## 2016-12-21 DIAGNOSIS — G5601 Carpal tunnel syndrome, right upper limb: Secondary | ICD-10-CM | POA: Diagnosis not present

## 2016-12-21 DIAGNOSIS — G5622 Lesion of ulnar nerve, left upper limb: Secondary | ICD-10-CM | POA: Diagnosis not present

## 2016-12-21 DIAGNOSIS — M5416 Radiculopathy, lumbar region: Secondary | ICD-10-CM | POA: Diagnosis not present

## 2016-12-21 DIAGNOSIS — Z9289 Personal history of other medical treatment: Secondary | ICD-10-CM | POA: Diagnosis not present

## 2017-01-26 DIAGNOSIS — M545 Low back pain: Secondary | ICD-10-CM | POA: Diagnosis not present

## 2017-01-26 DIAGNOSIS — M47816 Spondylosis without myelopathy or radiculopathy, lumbar region: Secondary | ICD-10-CM | POA: Diagnosis not present

## 2017-01-26 DIAGNOSIS — D6959 Other secondary thrombocytopenia: Secondary | ICD-10-CM | POA: Diagnosis not present

## 2017-01-26 DIAGNOSIS — G8929 Other chronic pain: Secondary | ICD-10-CM | POA: Diagnosis not present

## 2017-01-31 DIAGNOSIS — M5417 Radiculopathy, lumbosacral region: Secondary | ICD-10-CM | POA: Diagnosis not present

## 2017-01-31 DIAGNOSIS — R69 Illness, unspecified: Secondary | ICD-10-CM | POA: Diagnosis not present

## 2017-01-31 DIAGNOSIS — M47816 Spondylosis without myelopathy or radiculopathy, lumbar region: Secondary | ICD-10-CM | POA: Diagnosis not present

## 2017-01-31 DIAGNOSIS — G8929 Other chronic pain: Secondary | ICD-10-CM | POA: Diagnosis not present

## 2017-01-31 DIAGNOSIS — G8111 Spastic hemiplegia affecting right dominant side: Secondary | ICD-10-CM | POA: Diagnosis not present

## 2017-01-31 DIAGNOSIS — G894 Chronic pain syndrome: Secondary | ICD-10-CM | POA: Diagnosis not present

## 2017-01-31 DIAGNOSIS — M545 Low back pain: Secondary | ICD-10-CM | POA: Diagnosis not present

## 2017-01-31 DIAGNOSIS — M5481 Occipital neuralgia: Secondary | ICD-10-CM | POA: Diagnosis not present

## 2017-01-31 DIAGNOSIS — Z9289 Personal history of other medical treatment: Secondary | ICD-10-CM | POA: Diagnosis not present

## 2017-01-31 DIAGNOSIS — Z01818 Encounter for other preprocedural examination: Secondary | ICD-10-CM | POA: Diagnosis not present

## 2017-01-31 DIAGNOSIS — Z981 Arthrodesis status: Secondary | ICD-10-CM | POA: Diagnosis not present

## 2017-01-31 DIAGNOSIS — Z5181 Encounter for therapeutic drug level monitoring: Secondary | ICD-10-CM | POA: Diagnosis not present

## 2017-01-31 DIAGNOSIS — F1190 Opioid use, unspecified, uncomplicated: Secondary | ICD-10-CM | POA: Diagnosis not present

## 2017-01-31 DIAGNOSIS — M4802 Spinal stenosis, cervical region: Secondary | ICD-10-CM | POA: Diagnosis not present

## 2017-01-31 DIAGNOSIS — M5412 Radiculopathy, cervical region: Secondary | ICD-10-CM | POA: Diagnosis not present

## 2017-02-22 DIAGNOSIS — R69 Illness, unspecified: Secondary | ICD-10-CM | POA: Diagnosis not present

## 2017-02-22 DIAGNOSIS — R42 Dizziness and giddiness: Secondary | ICD-10-CM | POA: Diagnosis not present

## 2017-02-22 DIAGNOSIS — Z9884 Bariatric surgery status: Secondary | ICD-10-CM | POA: Diagnosis not present

## 2017-02-22 DIAGNOSIS — I10 Essential (primary) hypertension: Secondary | ICD-10-CM | POA: Diagnosis not present

## 2017-02-22 DIAGNOSIS — Z1389 Encounter for screening for other disorder: Secondary | ICD-10-CM | POA: Diagnosis not present

## 2017-02-22 DIAGNOSIS — E782 Mixed hyperlipidemia: Secondary | ICD-10-CM | POA: Diagnosis not present

## 2017-02-22 DIAGNOSIS — M109 Gout, unspecified: Secondary | ICD-10-CM | POA: Diagnosis not present

## 2017-02-22 DIAGNOSIS — M1991 Primary osteoarthritis, unspecified site: Secondary | ICD-10-CM | POA: Diagnosis not present

## 2017-02-22 DIAGNOSIS — E663 Overweight: Secondary | ICD-10-CM | POA: Diagnosis not present

## 2017-02-22 DIAGNOSIS — Z6828 Body mass index (BMI) 28.0-28.9, adult: Secondary | ICD-10-CM | POA: Diagnosis not present

## 2017-02-22 DIAGNOSIS — M48062 Spinal stenosis, lumbar region with neurogenic claudication: Secondary | ICD-10-CM | POA: Diagnosis not present

## 2017-03-21 DIAGNOSIS — X32XXXD Exposure to sunlight, subsequent encounter: Secondary | ICD-10-CM | POA: Diagnosis not present

## 2017-03-21 DIAGNOSIS — L570 Actinic keratosis: Secondary | ICD-10-CM | POA: Diagnosis not present

## 2017-03-22 DIAGNOSIS — M5481 Occipital neuralgia: Secondary | ICD-10-CM | POA: Diagnosis not present

## 2017-03-22 DIAGNOSIS — M5412 Radiculopathy, cervical region: Secondary | ICD-10-CM | POA: Diagnosis not present

## 2017-03-22 DIAGNOSIS — G43719 Chronic migraine without aura, intractable, without status migrainosus: Secondary | ICD-10-CM | POA: Diagnosis not present

## 2017-03-22 DIAGNOSIS — M5416 Radiculopathy, lumbar region: Secondary | ICD-10-CM | POA: Diagnosis not present

## 2017-04-19 DIAGNOSIS — M545 Low back pain: Secondary | ICD-10-CM | POA: Diagnosis not present

## 2017-04-19 DIAGNOSIS — G8929 Other chronic pain: Secondary | ICD-10-CM | POA: Diagnosis not present

## 2017-04-19 DIAGNOSIS — M5417 Radiculopathy, lumbosacral region: Secondary | ICD-10-CM | POA: Diagnosis not present

## 2017-04-19 DIAGNOSIS — M47816 Spondylosis without myelopathy or radiculopathy, lumbar region: Secondary | ICD-10-CM | POA: Diagnosis not present

## 2017-04-19 DIAGNOSIS — Z5181 Encounter for therapeutic drug level monitoring: Secondary | ICD-10-CM | POA: Diagnosis not present

## 2017-04-19 DIAGNOSIS — M5481 Occipital neuralgia: Secondary | ICD-10-CM | POA: Diagnosis not present

## 2017-04-19 DIAGNOSIS — G894 Chronic pain syndrome: Secondary | ICD-10-CM | POA: Diagnosis not present

## 2017-04-23 DIAGNOSIS — Z23 Encounter for immunization: Secondary | ICD-10-CM | POA: Diagnosis not present

## 2017-04-23 DIAGNOSIS — I10 Essential (primary) hypertension: Secondary | ICD-10-CM | POA: Diagnosis not present

## 2017-04-23 DIAGNOSIS — B029 Zoster without complications: Secondary | ICD-10-CM | POA: Diagnosis not present

## 2017-04-23 DIAGNOSIS — Z0001 Encounter for general adult medical examination with abnormal findings: Secondary | ICD-10-CM | POA: Diagnosis not present

## 2017-04-23 DIAGNOSIS — E663 Overweight: Secondary | ICD-10-CM | POA: Diagnosis not present

## 2017-04-23 DIAGNOSIS — Z6828 Body mass index (BMI) 28.0-28.9, adult: Secondary | ICD-10-CM | POA: Diagnosis not present

## 2017-04-23 DIAGNOSIS — Z1389 Encounter for screening for other disorder: Secondary | ICD-10-CM | POA: Diagnosis not present

## 2017-04-26 DIAGNOSIS — D6959 Other secondary thrombocytopenia: Secondary | ICD-10-CM | POA: Diagnosis not present

## 2017-04-26 DIAGNOSIS — M533 Sacrococcygeal disorders, not elsewhere classified: Secondary | ICD-10-CM | POA: Diagnosis not present

## 2017-04-27 ENCOUNTER — Encounter: Payer: Self-pay | Admitting: Gastroenterology

## 2017-04-27 ENCOUNTER — Ambulatory Visit (INDEPENDENT_AMBULATORY_CARE_PROVIDER_SITE_OTHER): Payer: Medicare HMO | Admitting: Gastroenterology

## 2017-04-27 VITALS — BP 125/73 | HR 104 | Temp 97.9°F | Ht 72.0 in | Wt 206.8 lb

## 2017-04-27 DIAGNOSIS — K5903 Drug induced constipation: Secondary | ICD-10-CM

## 2017-04-27 NOTE — Assessment & Plan Note (Signed)
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 He notes with decreased oxycodone use he is actually having increased issues with complete bowel movements. He has added fiber twice a day and 3 senna at bedtime to his daily Amitiza. Did not see great benefit with twice a day Amitiza and therefore takes it only once daily because it is quite expensive for him. He did not tolerate Movantik due to side effects.  Last colonoscopy was 2014, flexible sigmoidoscopy in 2015 as outlined above. As discussed with patient, I will talk with Dr. Gala Romney regarding current symptoms. It is not clear that he would benefit from another colonoscopy or even anorectal manometry at this point. He is doing well with current regimen and advised him to continue until further recommendations.

## 2017-04-27 NOTE — Patient Instructions (Signed)
1. Continue current bowel regimen.  2. I will let you know what Dr. Gala Romney advises regarding your bowel change.

## 2017-04-27 NOTE — Progress Notes (Signed)
Primary Care Physician: Redmond School, MD  Primary Gastroenterologist:  Garfield Cornea, MD   Chief Complaint  Patient presents with  . Constipation    HPI: Jerome [name] is a 56 y.o. male here for evaluation of constipation. He was last seen in 2015. At that time he complained of proctalgia and rectal bleeding. Had anal receptive intercourse with HIV positive patient prior to symptoms, failure of barrier protection. Had prophylactic antivirals due to exposure. Flexible sigmoidoscopy showed markedly inflamed very distal rectum with ulceration, rectal polyp. Biopsies showed evidence of acute ulceration but no infectious process. Benign polyp removed. Plan for repeat colonoscopy in 2024.  Patient states he's had some change in bowel habits over the past couple of months. As of July, he has been on decreased oxycodone dose, 5 mg #90 last for three months. Walking more. Given his severe spinal stenosis he has lost sensation of all extremities as well as perianal area. Don't always know when as to go to bathroom. He likes to have a bowel movement every morning so that throughout the day he is less likely to be incontinent. Used to Take one Amitiza 24 g in morning with 1/2 caf coffee two cups.  He would have a good bowel movement and define the rest of the day. Now with decreased oxycodone use, he seems to require more laxatives to have a bowel movement and feel like it's complete. He added fiber supplement in the morning, Citrucel equivalent. At nighttime he takes fiber chewable tablets equivalent of 4-6 g. Also takes 3 Senokot at night. This regimen has resulted in a complete bowel movement every morning and has eliminated fecal incontinence/soilage issues that he was having before. He denies melena or rectal bleeding. No rectal pain. No abdominal pain. No upper GI symptoms.  States his PCP wanted him follow up with [name] because he was concerned about use of Senokot regularly and whether or not  he needed to have a colonoscopy.       Current Outpatient Prescriptions  Medication Sig Dispense Refill  . acetaminophen (TYLENOL) 500 MG tablet Take 1,000 mg by mouth 2 (two) times daily as needed for moderate pain or headache.     . Alpha-Lipoic Acid 600 MG CAPS Take 600 mg by mouth daily.     . AMITIZA 24 MCG capsule TAKE 1 CAPSULE BY MOUTH TWICE DAILY WITH FOOD (Patient taking differently: taking once a day) 60 capsule 3  . B COMPLEX VITAMINS SL Place 1 Dose under the tongue daily. Liquid    . Calcium Carb-Cholecalciferol (CALCIUM/VITAMIN D PO) Take 1 tablet by mouth daily.    . cetirizine (ZYRTEC) 10 MG tablet Take 10 mg by mouth as needed for allergies.    . clonazePAM (KLONOPIN) 1 MG tablet Take 1 mg by mouth 2 (two) times daily.     . cyclobenzaprine (FLEXERIL) 5 MG tablet Take 5 mg by mouth 3 (three) times daily as needed for muscle spasms.     Marland Kitchen ECHINACEA PO Take 1 capsule by mouth daily.    . Flaxseed, Linseed, (FLAX SEED OIL PO) Take 1 tablet by mouth daily.     . fluticasone (FLONASE) 50 MCG/ACT nasal spray Place 2 sprays into both nostrils daily as needed for congestion.  5  . losartan (COZAAR) 50 MG tablet Take 25 mg by mouth daily.  3  . Multiple Vitamin (MULTIVITAMIN WITH MINERALS) TABS tablet Take 1 tablet by mouth daily.    Marland Kitchen omeprazole (PRILOSEC) 20  MG capsule Take 20 mg by mouth daily.     Marland Kitchen oxyCODONE (ROXICODONE) 15 MG immediate release tablet Take 5 mg by mouth.     . pravastatin (PRAVACHOL) 40 MG tablet Take 40 mg by mouth daily.    . sertraline (ZOLOFT) 100 MG tablet Take 50 mg by mouth daily.     . valACYclovir (VALTREX) 1000 MG tablet Take 1,000 mg by mouth 3 (three) times daily.    Marland Kitchen zinc gluconate 50 MG tablet Take 50 mg by mouth daily.     No current facility-administered medications for this visit.     Allergies as of 04/27/2017 - Review Complete 04/27/2017  Allergen Reaction Noted  . Aspirin Nausea And Vomiting 03/27/2014  . Codeine Nausea And Vomiting  10/14/2012  . Nsaids Anxiety 07/27/2013    ROS:  General: Negative for anorexia, weight loss, fever, chills, fatigue, weakness. ENT: Negative for hoarseness, difficulty swallowing , nasal congestion. CV: Negative for chest pain, angina, palpitations, dyspnea on exertion, peripheral edema.  Respiratory: Negative for dyspnea at rest, dyspnea on exertion, cough, sputum, wheezing.  GI: See history of present illness. GU:  Negative for dysuria, hematuria, urinary incontinence, urinary frequency, nocturnal urination.  Endo: Negative for unusual weight change.    Physical Examination:   BP 125/73   Pulse (!) 104   Temp 97.9 F (36.6 C) (Oral)   Ht 6' (1.829 m)   Wt 206 lb 12.8 oz (93.8 kg)   BMI 28.05 kg/m   General: Well-nourished, well-developed in no acute distress.  Eyes: No icterus. Mouth: Oropharyngeal mucosa moist and pink , no lesions erythema or exudate.  Abdomen: Bowel sounds are normal, nontender, nondistended, no hepatosplenomegaly or masses, no abdominal bruits or hernia , no rebound or guarding.   Extremities: No lower extremity edema. No clubbing or deformities. Neuro: Alert and oriented x 4   Skin: Warm and dry, no jaundice.   Psych: Alert and cooperative, normal mood and affect.

## 2017-05-16 DIAGNOSIS — F418 Other specified anxiety disorders: Secondary | ICD-10-CM | POA: Diagnosis not present

## 2017-05-16 DIAGNOSIS — R69 Illness, unspecified: Secondary | ICD-10-CM | POA: Diagnosis not present

## 2017-06-18 NOTE — Progress Notes (Signed)
Please let patient know I'm sorry for delay in getting back in touch with him but I did discuss his case with Dr. Gala Romney. He recommends patient continue his current bowel regimen since he is doing well with it. NO need for colonoscopy at this time. If he has change in bowel habits, blood in stool, weight loss, abd pain, etc then would reconsider colonoscopy.   Offer patient OV in six months for follow up with rmr.

## 2017-06-18 NOTE — Progress Notes (Signed)
ON RECALL FOR OV WITH RMR °

## 2017-06-19 DIAGNOSIS — I10 Essential (primary) hypertension: Secondary | ICD-10-CM | POA: Diagnosis not present

## 2017-06-19 DIAGNOSIS — H524 Presbyopia: Secondary | ICD-10-CM | POA: Diagnosis not present

## 2017-06-20 NOTE — Progress Notes (Signed)
Pt notified and will follow up in 6 months with RMR

## 2017-07-10 DIAGNOSIS — Z5181 Encounter for therapeutic drug level monitoring: Secondary | ICD-10-CM | POA: Diagnosis not present

## 2017-07-10 DIAGNOSIS — M5481 Occipital neuralgia: Secondary | ICD-10-CM | POA: Diagnosis not present

## 2017-07-10 DIAGNOSIS — M5417 Radiculopathy, lumbosacral region: Secondary | ICD-10-CM | POA: Diagnosis not present

## 2017-07-10 DIAGNOSIS — M533 Sacrococcygeal disorders, not elsewhere classified: Secondary | ICD-10-CM | POA: Diagnosis not present

## 2017-07-10 DIAGNOSIS — M47816 Spondylosis without myelopathy or radiculopathy, lumbar region: Secondary | ICD-10-CM | POA: Diagnosis not present

## 2017-07-10 DIAGNOSIS — M545 Low back pain: Secondary | ICD-10-CM | POA: Diagnosis not present

## 2017-07-10 DIAGNOSIS — G8929 Other chronic pain: Secondary | ICD-10-CM | POA: Diagnosis not present

## 2017-07-10 DIAGNOSIS — G894 Chronic pain syndrome: Secondary | ICD-10-CM | POA: Diagnosis not present

## 2017-07-10 DIAGNOSIS — M542 Cervicalgia: Secondary | ICD-10-CM | POA: Diagnosis not present

## 2017-07-11 ENCOUNTER — Telehealth: Payer: Self-pay | Admitting: *Deleted

## 2017-07-11 NOTE — Telephone Encounter (Signed)
Pt called and states that he is having on and off chest pain and request an office visit.  Pt given appt for Jan 16. Called pt to triage chest pain. No answer. Left message to call back.

## 2017-08-06 ENCOUNTER — Other Ambulatory Visit: Payer: Self-pay | Admitting: Gastroenterology

## 2017-08-08 ENCOUNTER — Other Ambulatory Visit: Payer: Self-pay | Admitting: *Deleted

## 2017-08-08 ENCOUNTER — Ambulatory Visit: Payer: Medicare HMO | Admitting: Cardiovascular Disease

## 2017-08-08 ENCOUNTER — Encounter: Payer: Self-pay | Admitting: Cardiovascular Disease

## 2017-08-08 VITALS — BP 120/76 | HR 74 | Ht 72.0 in | Wt 211.0 lb

## 2017-08-08 DIAGNOSIS — E785 Hyperlipidemia, unspecified: Secondary | ICD-10-CM | POA: Diagnosis not present

## 2017-08-08 DIAGNOSIS — I10 Essential (primary) hypertension: Secondary | ICD-10-CM

## 2017-08-08 DIAGNOSIS — R079 Chest pain, unspecified: Secondary | ICD-10-CM

## 2017-08-08 MED ORDER — LUBIPROSTONE 24 MCG PO CAPS: ORAL_CAPSULE | ORAL | 5 refills | Status: DC

## 2017-08-08 NOTE — Progress Notes (Signed)
SUBJECTIVE: The patient presents for evaluation of chest pain.  I last saw him in August 2017 and instructed him to follow-up as needed.  He had a normal coronary angiogram on March 11, 2010.  He has a history of venous varicosities, hypertension, obstructive sleep apnea, and hyperlipidemia.  ECG performed in the office today which I ordered and personally interpreted demonstrates normal sinus rhythm with no ischemic ST segment or T-wave abnormalities, nor any arrhythmias.  He said about a month ago, he had a sensation of "bubbles floating upward in his chest ".  He said occurred while lying down and trying to meditate.  He is due for an injection in the C7-T1 region.  He had more those sensations about a month ago while cleaning his kitchen floor.  He returned from [place] yesterday where he is trying to sell a piece of property.  He did not experience any chest pain, shortness of breath, or palpitations for the past 1 month.  He was diagnosed with shingles in September and felt he may be experiencing some neuropathic pain on his chest from this.  He also describes an sensation of dizziness and feeling like he is going to pass out if he closes his eyes while in the shower.  He denies taking hot showers.   Review of Systems: As per "subjective", otherwise negative.  Allergies  Allergen Reactions  . Aspirin Nausea And Vomiting    Due to gastric sleeve surgery   . Codeine Nausea And Vomiting  . Nsaids Anxiety    Due to gastric sleeve surgery  N/V    Current Outpatient Medications  Medication Sig Dispense Refill  . acetaminophen (TYLENOL) 500 MG tablet Take 1,000 mg by mouth 2 (two) times daily as needed for moderate pain or headache.     . Alpha-Lipoic Acid 600 MG CAPS Take 600 mg by mouth daily.     . AMITIZA 24 MCG capsule TAKE 1 CAPSULE BY MOUTH TWICE DAILY WITH FOOD (Patient taking differently: taking once a day) 60 capsule 3  . B COMPLEX VITAMINS SL Place 1 Dose under the  tongue daily. Liquid    . Calcium Carb-Cholecalciferol (CALCIUM/VITAMIN D PO) Take 1 tablet by mouth daily.    . cetirizine (ZYRTEC) 10 MG tablet Take 10 mg by mouth as needed for allergies.    . clonazePAM (KLONOPIN) 1 MG tablet Take 1 mg by mouth 2 (two) times daily.     . cyclobenzaprine (FLEXERIL) 5 MG tablet Take 5 mg by mouth 3 (three) times daily as needed for muscle spasms.     Marland Kitchen ECHINACEA PO Take 1 capsule by mouth daily.    . Flaxseed, Linseed, (FLAX SEED OIL PO) Take 1 tablet by mouth daily.     . fluticasone (FLONASE) 50 MCG/ACT nasal spray Place 2 sprays into both nostrils daily as needed for congestion.  5  . HYDROcodone-acetaminophen (NORCO/VICODIN) 5-325 MG tablet Take by mouth.    . losartan (COZAAR) 50 MG tablet Take 25 mg by mouth daily.  3  . Multiple Vitamin (MULTIVITAMIN WITH MINERALS) TABS tablet Take 1 tablet by mouth daily.    Marland Kitchen omeprazole (PRILOSEC) 20 MG capsule Take 20 mg by mouth daily.     . pravastatin (PRAVACHOL) 40 MG tablet Take 40 mg by mouth daily.    . sertraline (ZOLOFT) 100 MG tablet Take 50 mg by mouth daily.     . valACYclovir (VALTREX) 1000 MG tablet Take 1,000 mg by mouth 3 (  three) times daily.    Marland Kitchen zinc gluconate 50 MG tablet Take 50 mg by mouth daily.     No current facility-administered medications for this visit.     Past Medical History:  Diagnosis Date  . Anxiety   . DJD (degenerative joint disease)   . H/O cardiovascular stress test 2013  . H/O echocardiogram 2012   for cad, htn & obesity  . Hypertension   . Obesity   . Spinal stenosis   . Thrombocytopenia ([place])     Past Surgical History:  Procedure Laterality Date  . BACK SURGERY     x 4  . BREAST REDUCTION SURGERY    . CARDIAC CATHETERIZATION  2011   5 frech sheath  . COLONOSCOPY N/A 10/14/2012   JME:QASTMH rectum and colon  . CYST EXCISION Right 08/08/2016   Procedure: EXCISION OF RIGHT INGUINAL SUBCUTANEOUS MASS (LIKELY SEBACEIYS CYST);  Surgeon: Vickie Epley, MD;   Location: [facility];  Service: General;  Laterality: Right;  7cm x 4cm mass; dermabond dressing  . FLEXIBLE SIGMOIDOSCOPY N/A 10/15/2013   Procedure: FLEXIBLE SIGMOIDOSCOPY;  Surgeon: Daneil Dolin, MD;  Location: [facility];  Service: Endoscopy;  Laterality: N/A;  8:30  . h/o cardiac cath    . LAPAROSCOPIC GASTRIC SLEEVE RESECTION  06/2012   Dr Mickie Bail [facility])    Social History   Socioeconomic History  . Marital status: Divorced    Spouse name: Not on file  . Number of children: 2  . Years of education: Not on file  . Highest education level: Not on file  Social Needs  . Financial resource strain: Not on file  . Food insecurity - worry: Not on file  . Food insecurity - inability: Not on file  . Transportation needs - medical: Not on file  . Transportation needs - non-medical: Not on file  Occupational History  . 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 Smoking status: Former Smoker    Packs/day: 0.50    Types: Cigarettes    Last attempt to quit: 06/12/2012    Years since quitting: 5.1  . Smokeless tobacco: Never Used  Substance and Sexual Activity  . Alcohol use: Yes    Alcohol/week: 0.0 oz    Comment: occasionally   . Drug use: No  . Sexual activity: Yes    Partners: Male  Other Topics Concern  . Not on file  Social History Narrative   Moving to [place] to care for brother w/ brain tumor   Lives w/ 2 daughters & 4 grandsons     Vitals:   08/08/17 0902  BP: 120/76  Pulse: 74  SpO2: 98%  Weight: 211 lb (95.7 kg)  Height: 6' (1.829 m)    Wt Readings from Last 3 Encounters:  08/08/17 211 lb (95.7 kg)  04/27/17 206 lb 12.8 oz (93.8 kg)  03/14/16 201 lb (91.2 kg)     PHYSICAL EXAM General: NAD HEENT: Normal. Neck: No JVD, no thyromegaly. Lungs: Clear to auscultation bilaterally with normal respiratory effort. CV: Regular rate and rhythm, normal S1/S2, no S3/S4, no murmur. No pretibial or periankle edema.  No carotid bruit.     Abdomen: Soft, nontender, no distention.  Neurologic: Alert and oriented.  Psych: Normal affect. Skin: Normal. Musculoskeletal: No gross deformities.    ECG: Most recent ECG reviewed.   Labs: Lab Results  Component Value Date/Time   K 4.6 01/03/2016 03:48 PM   BUN 17 01/03/2016 03:48 PM  CREATININE 0.73 01/03/2016 03:48 PM   ALT 21 07/27/2013 11:28 AM   HGB 15.6 08/08/2016 07:55 AM     Lipids: No results found for: LDLCALC, LDLDIRECT, CHOL, TRIG, HDL     ASSESSMENT AND PLAN: 1.  Chest pressure: This is noncardiac in etiology.  Symptoms are very atypical for any form of ischemic or arrhythmic cardiac disease.  He underwent a normal coronary angiogram in 2011.  No testing is indicated at this time.  2.  Hypertension: Controlled on losartan.  No changes to therapy.  3.  Hyperlipidemia: Continue statin therapy.    Disposition: Follow up as needed   Kate Sable, M.D., F.A.C.C.

## 2017-08-08 NOTE — Patient Instructions (Signed)
Medication Instructions:  Your physician recommends that you continue on your current medications as directed. Please refer to the Current Medication list given to you today.   Labwork: NONE   Testing/Procedures: NONE   Follow-Up: Your physician recommends that you schedule a follow-up appointment As Needed    Any Other Special Instructions Will Be Listed Below (If Applicable).     If you need a refill on your cardiac medications before your next appointment, please call your pharmacy.  Thank you for choosing [facility]!   

## 2017-08-09 DIAGNOSIS — M5412 Radiculopathy, cervical region: Secondary | ICD-10-CM | POA: Diagnosis not present

## 2017-08-09 DIAGNOSIS — D6959 Other secondary thrombocytopenia: Secondary | ICD-10-CM | POA: Diagnosis not present

## 2017-08-09 DIAGNOSIS — M542 Cervicalgia: Secondary | ICD-10-CM | POA: Diagnosis not present

## 2017-08-09 DIAGNOSIS — D689 Coagulation defect, unspecified: Secondary | ICD-10-CM | POA: Diagnosis not present

## 2017-09-26 DIAGNOSIS — E663 Overweight: Secondary | ICD-10-CM | POA: Diagnosis not present

## 2017-09-26 DIAGNOSIS — R0600 Dyspnea, unspecified: Secondary | ICD-10-CM | POA: Diagnosis not present

## 2017-09-26 DIAGNOSIS — Z1389 Encounter for screening for other disorder: Secondary | ICD-10-CM | POA: Diagnosis not present

## 2017-09-26 DIAGNOSIS — R062 Wheezing: Secondary | ICD-10-CM | POA: Diagnosis not present

## 2017-09-26 DIAGNOSIS — H6123 Impacted cerumen, bilateral: Secondary | ICD-10-CM | POA: Diagnosis not present

## 2017-09-26 DIAGNOSIS — Z6829 Body mass index (BMI) 29.0-29.9, adult: Secondary | ICD-10-CM | POA: Diagnosis not present

## 2017-10-08 DIAGNOSIS — M542 Cervicalgia: Secondary | ICD-10-CM | POA: Diagnosis not present

## 2017-10-08 DIAGNOSIS — M47816 Spondylosis without myelopathy or radiculopathy, lumbar region: Secondary | ICD-10-CM | POA: Diagnosis not present

## 2017-10-08 DIAGNOSIS — M533 Sacrococcygeal disorders, not elsewhere classified: Secondary | ICD-10-CM | POA: Diagnosis not present

## 2017-10-08 DIAGNOSIS — M545 Low back pain: Secondary | ICD-10-CM | POA: Diagnosis not present

## 2017-10-08 DIAGNOSIS — D689 Coagulation defect, unspecified: Secondary | ICD-10-CM | POA: Diagnosis not present

## 2017-10-08 DIAGNOSIS — Z5181 Encounter for therapeutic drug level monitoring: Secondary | ICD-10-CM | POA: Diagnosis not present

## 2017-10-08 DIAGNOSIS — G8929 Other chronic pain: Secondary | ICD-10-CM | POA: Diagnosis not present

## 2017-10-08 DIAGNOSIS — M5417 Radiculopathy, lumbosacral region: Secondary | ICD-10-CM | POA: Diagnosis not present

## 2017-10-08 DIAGNOSIS — M5412 Radiculopathy, cervical region: Secondary | ICD-10-CM | POA: Diagnosis not present

## 2017-10-22 ENCOUNTER — Encounter: Payer: Self-pay | Admitting: Internal Medicine

## 2017-10-22 DEATH — deceased

## 2017-11-28 DIAGNOSIS — Z0100 Encounter for examination of eyes and vision without abnormal findings: Secondary | ICD-10-CM | POA: Diagnosis not present

## 2017-12-04 ENCOUNTER — Encounter: Payer: Self-pay | Admitting: Internal Medicine

## 2017-12-04 ENCOUNTER — Ambulatory Visit: Payer: Medicare HMO | Admitting: Internal Medicine

## 2017-12-04 VITALS — BP 128/88 | HR 75 | Temp 97.5°F | Ht 72.0 in | Wt 210.4 lb

## 2017-12-04 DIAGNOSIS — K5903 Drug induced constipation: Secondary | ICD-10-CM | POA: Diagnosis not present

## 2017-12-04 NOTE — Patient Instructions (Addendum)
Constipation information provided  May continue Amitiza 24 once daily  May continue Senna at bedtime  Office visit in 18 months

## 2017-12-04 NOTE — Progress Notes (Signed)
Primary Care Physician:  Redmond School, MD Primary Gastroenterologist:  Dr. Gala Romney  Pre-Procedure History & Physical: HPI:  Jerome [name] is a 57 y.o. male here for follow-up of chronic constipation. In part, opioid induced and  severe spinal stenosis. Patient is customized his bowel regimen. Currently, he takes Amitiza 24  once daily and senna 3 tablets at bedtime. With this regimen and drinking coffee in the morning, he has a least one reasonably good bowel movement everyday; no melena or rectal bleeding. Benign polyp removed at sigmoidoscopy previously; prior colonoscopy negative. He is due for average risk screening 2024.  Past Medical History:  Diagnosis Date  . Anxiety   . DJD (degenerative joint disease)   . H/O cardiovascular stress test 2013  . H/O echocardiogram 2012   for cad, htn & obesity  . Hypertension   . Obesity   . Spinal stenosis   . Thrombocytopenia ([place])     Past Surgical History:  Procedure Laterality Date  . BACK SURGERY     x 4  . BREAST REDUCTION SURGERY    . CARDIAC CATHETERIZATION  2011   5 frech sheath  . COLONOSCOPY N/A 10/14/2012   JME:QASTMH rectum and colon  . CYST EXCISION Right 08/08/2016   Procedure: EXCISION OF RIGHT INGUINAL SUBCUTANEOUS MASS (LIKELY SEBACEIYS CYST);  Surgeon: Vickie Epley, MD;  Location: [facility];  Service: General;  Laterality: Right;  7cm x 4cm mass; dermabond dressing  . FLEXIBLE SIGMOIDOSCOPY N/A 10/15/2013   Procedure: FLEXIBLE SIGMOIDOSCOPY;  Surgeon: Daneil Dolin, MD;  Location: [facility];  Service: Endoscopy;  Laterality: N/A;  8:30  . h/o cardiac cath    . LAPAROSCOPIC GASTRIC SLEEVE RESECTION  06/2012   Dr Mickie Bail [facility])    Prior to Admission medications   Medication Sig Start Date End Date Taking? Authorizing Provider  acetaminophen (TYLENOL) 500 MG tablet Take 1,000 mg by mouth 2 (two) times daily as needed for moderate pain or headache.    Yes [provider]  Alpha-Lipoic Acid 600 MG  CAPS Take 600 mg by mouth daily.    Yes [provider]  B COMPLEX VITAMINS SL Place 1 Dose under the tongue daily. Liquid   Yes [provider]  Calcium Carb-Cholecalciferol (CALCIUM/VITAMIN D PO) Take 1 tablet by mouth daily.   Yes [provider]  cetirizine (ZYRTEC) 10 MG tablet Take 10 mg by mouth as needed for allergies.   Yes [provider]  clonazePAM (KLONOPIN) 1 MG tablet Take 1 mg by mouth 2 (two) times daily.  04/20/14  Yes [provider]  cyclobenzaprine (FLEXERIL) 5 MG tablet Take 5 mg by mouth 3 (three) times daily as needed for muscle spasms.  12/21/15  Yes [provider]  FIBER PO Take 4 tablets by mouth daily.   Yes [provider]  Flaxseed, Linseed, (FLAX SEED OIL PO) Take 1 tablet by mouth daily.    Yes [provider]  fluticasone (FLONASE) 50 MCG/ACT nasal spray Place 2 sprays into both nostrils daily as needed for congestion. 07/07/16  Yes [provider]  HYDROcodone-acetaminophen (NORCO/VICODIN) 5-325 MG tablet Take by mouth. 07/10/17  Yes [provider]  losartan (COZAAR) 50 MG tablet Take 25 mg by mouth daily. 07/11/16  Yes [provider]  lubiprostone (AMITIZA) 24 MCG capsule TAKE 1 CAPSULE BY MOUTH TWICE DAILY WITH FOOD 08/08/17  Yes Carlis Stable, NP  Multiple Vitamin (MULTIVITAMIN WITH MINERALS) TABS tablet Take 1 tablet by  mouth daily.   Yes [provider]  omeprazole (PRILOSEC) 20 MG capsule Take 20 mg by mouth daily.  09/23/12  Yes [provider]  pravastatin (PRAVACHOL) 40 MG tablet Take 40 mg by mouth daily.   Yes [provider]  senna (SENOKOT) 8.6 MG tablet Take 3 tablets by mouth daily. Alternates with dulcolax   Yes [provider]  sertraline (ZOLOFT) 100 MG tablet Take 50 mg by mouth daily.  09/27/12  Yes [provider]  zinc gluconate 50 MG tablet Take 50 mg by mouth daily.   Yes [provider]    ECHINACEA PO Take 1 capsule by mouth daily.    [provider]  valACYclovir (VALTREX) 1000 MG tablet Take 1,000 mg by mouth 3 (three) times daily.    [provider]    Allergies as of 12/04/2017 - Review Complete 12/04/2017  Allergen Reaction Noted  . Aspirin Nausea And Vomiting 03/27/2014  . Codeine Nausea And Vomiting 10/14/2012  . Nsaids Anxiety 07/27/2013    Family History  Problem Relation Age of Onset  . Heart attack Father   . Colon cancer Paternal Grandmother   . Brain cancer Brother 64    Social History   Socioeconomic History  . Marital status: Divorced    Spouse name: Not on file  . Number of children: 2  . Years of education: Not on file  . Highest education level: Not on file  Occupational History  . 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 Financial resource strain: Not on file  . Food insecurity:    Worry: Not on file    Inability: Not on file  . Transportation needs:    Medical: Not on file    Non-medical: Not on file  Tobacco Use  . Smoking status: Former Smoker    Packs/day: 0.50    Types: Cigarettes    Last attempt to quit: 06/12/2012    Years since quitting: 5.4  . Smokeless tobacco: Never Used  Substance and Sexual Activity  . Alcohol use: Yes    Alcohol/week: 0.0 oz    Comment: occasionally   . Drug use: No  . Sexual activity: Yes    Partners: Male  Lifestyle  . Physical activity:    Days per week: Not on file    Minutes per session: Not on file  . Stress: Not on file  Relationships  . Social connections:    Talks on phone: Not on file    Gets together: Not on file    Attends religious service: Not on file    Active member of club or organization: Not on file    Attends meetings of clubs or organizations: Not on file    Relationship status: Not on file  . Intimate partner violence:    Fear of current or ex partner: Not on file    Emotionally abused: Not on file    Physically  abused: Not on file    Forced sexual activity: Not on file  Other Topics Concern  . Not on file  Social History Narrative   Moving to [place] to care for brother w/ brain tumor   Lives w/ 2 daughters & 4 grandsons    Review of Systems: See HPI, otherwise negative ROS  Physical Exam: BP 128/88   Pulse 75   Temp (!) 97.5 F (36.4 C) (Oral)   Ht 6' (1.829 m)   Wt 210 lb 6.4 oz (95.4 kg)  BMI 28.54 kg/m  General:   Alert,  pleasant and cooperative in NAD Neck:  Supple; no masses or thyromegaly. No significant cervical adenopathy. Lungs:  Clear throughout to auscultation.   No wheezes, crackles, or rhonchi. No acute distress. Heart:  Regular rate and rhythm; no murmurs, clicks, rubs,  or gallops. Abdomen: Non-distended, normal bowel sounds.  Soft and nontender without appreciable mass or hepatosplenomegaly.  Pulses:  Normal pulses noted. Extremities:  Without clubbing or edema.   Impression:  Pleasant 57 year old gentleman with a history of constipation-multifactorial in etiology. Now, actually doing very well on Amitiza 24 g in the morning and 3 senna tablets in the evening.  No alarm symptoms. He'll be due for a screening colonoscopy in 2024.  Recommendations:  Constipation information provided  May continue Amitiza 24 once daily  May continue Senna at bedtime  Office visit in 18 months    Notice: This dictation was prepared with Dragon dictation along with smaller phrase technology. Any transcriptional errors that result from this process are unintentional and may not be corrected upon review.

## 2017-12-11 ENCOUNTER — Other Ambulatory Visit (HOSPITAL_COMMUNITY): Payer: Self-pay | Admitting: Family Medicine

## 2017-12-11 ENCOUNTER — Ambulatory Visit (HOSPITAL_COMMUNITY)
Admission: RE | Admit: 2017-12-11 | Discharge: 2017-12-11 | Disposition: A | Discharge status: Home / Self Care | Payer: Medicare HMO | Source: Ambulatory Visit | Attending: Family Medicine | Admitting: Family Medicine

## 2017-12-11 DIAGNOSIS — R071 Chest pain on breathing: Secondary | ICD-10-CM | POA: Diagnosis not present

## 2017-12-11 DIAGNOSIS — R079 Chest pain, unspecified: Secondary | ICD-10-CM | POA: Diagnosis not present

## 2017-12-11 DIAGNOSIS — E663 Overweight: Secondary | ICD-10-CM | POA: Diagnosis not present

## 2017-12-11 DIAGNOSIS — R69 Illness, unspecified: Secondary | ICD-10-CM | POA: Diagnosis not present

## 2017-12-11 DIAGNOSIS — Z6828 Body mass index (BMI) 28.0-28.9, adult: Secondary | ICD-10-CM | POA: Diagnosis not present

## 2017-12-18 DIAGNOSIS — D689 Coagulation defect, unspecified: Secondary | ICD-10-CM | POA: Diagnosis not present

## 2017-12-18 DIAGNOSIS — D696 Thrombocytopenia, unspecified: Secondary | ICD-10-CM | POA: Diagnosis not present

## 2017-12-27 DIAGNOSIS — M533 Sacrococcygeal disorders, not elsewhere classified: Secondary | ICD-10-CM | POA: Diagnosis not present

## 2018-01-02 ENCOUNTER — Telehealth: Payer: Self-pay | Admitting: Vascular Surgery

## 2018-01-02 ENCOUNTER — Other Ambulatory Visit: Payer: Self-pay

## 2018-01-02 DIAGNOSIS — I83899 Varicose veins of unspecified lower extremities with other complications: Secondary | ICD-10-CM

## 2018-01-02 NOTE — Telephone Encounter (Signed)
sch appt spk to pt 02/21/18 130pm reflux 215pm new VV

## 2018-01-02 NOTE — Telephone Encounter (Signed)
-----   Message from Rica Records, RN sent at 01/02/2018 11:06 AM EDT ----- Regarding: scheduling/ new VV Jerome [name] is s/p laser ablation and stab phlebectomy in 08-2014 with Dr. Kellie Simmering.  He is currently having left leg pain and bulging VV.  Since he has not been seen in over 3 years, he needs a new VV appointment with venous reflux study of the left leg.  Since he has a history of vein surgery, I think it best to schedule this new vein appointment with ultrasound with Scot Dock or Fields.  Thanks!  If you have any questions, please let me know!!!!!!

## 2018-01-10 DIAGNOSIS — D689 Coagulation defect, unspecified: Secondary | ICD-10-CM | POA: Diagnosis not present

## 2018-01-10 DIAGNOSIS — Z5181 Encounter for therapeutic drug level monitoring: Secondary | ICD-10-CM | POA: Diagnosis not present

## 2018-01-10 DIAGNOSIS — M542 Cervicalgia: Secondary | ICD-10-CM | POA: Diagnosis not present

## 2018-01-10 DIAGNOSIS — M5412 Radiculopathy, cervical region: Secondary | ICD-10-CM | POA: Diagnosis not present

## 2018-01-10 DIAGNOSIS — F418 Other specified anxiety disorders: Secondary | ICD-10-CM | POA: Diagnosis not present

## 2018-01-10 DIAGNOSIS — G8929 Other chronic pain: Secondary | ICD-10-CM | POA: Diagnosis not present

## 2018-01-10 DIAGNOSIS — M533 Sacrococcygeal disorders, not elsewhere classified: Secondary | ICD-10-CM | POA: Diagnosis not present

## 2018-01-10 DIAGNOSIS — R69 Illness, unspecified: Secondary | ICD-10-CM | POA: Diagnosis not present

## 2018-01-10 DIAGNOSIS — M5481 Occipital neuralgia: Secondary | ICD-10-CM | POA: Diagnosis not present

## 2018-01-10 DIAGNOSIS — M545 Low back pain: Secondary | ICD-10-CM | POA: Diagnosis not present

## 2018-01-10 DIAGNOSIS — G894 Chronic pain syndrome: Secondary | ICD-10-CM | POA: Diagnosis not present

## 2018-01-10 DIAGNOSIS — M47816 Spondylosis without myelopathy or radiculopathy, lumbar region: Secondary | ICD-10-CM | POA: Diagnosis not present

## 2018-01-10 DIAGNOSIS — M5417 Radiculopathy, lumbosacral region: Secondary | ICD-10-CM | POA: Diagnosis not present

## 2018-02-07 DIAGNOSIS — R69 Illness, unspecified: Secondary | ICD-10-CM | POA: Diagnosis not present

## 2018-02-08 DIAGNOSIS — D689 Coagulation defect, unspecified: Secondary | ICD-10-CM | POA: Diagnosis not present

## 2018-02-08 DIAGNOSIS — M5412 Radiculopathy, cervical region: Secondary | ICD-10-CM | POA: Diagnosis not present

## 2018-02-08 DIAGNOSIS — R69 Illness, unspecified: Secondary | ICD-10-CM | POA: Diagnosis not present

## 2018-02-13 DIAGNOSIS — R69 Illness, unspecified: Secondary | ICD-10-CM | POA: Diagnosis not present

## 2018-02-13 DIAGNOSIS — I10 Essential (primary) hypertension: Secondary | ICD-10-CM | POA: Diagnosis not present

## 2018-02-13 DIAGNOSIS — M545 Low back pain: Secondary | ICD-10-CM | POA: Diagnosis not present

## 2018-02-13 DIAGNOSIS — M542 Cervicalgia: Secondary | ICD-10-CM | POA: Diagnosis not present

## 2018-02-19 DIAGNOSIS — Z1389 Encounter for screening for other disorder: Secondary | ICD-10-CM | POA: Diagnosis not present

## 2018-02-19 DIAGNOSIS — Z9884 Bariatric surgery status: Secondary | ICD-10-CM | POA: Diagnosis not present

## 2018-02-19 DIAGNOSIS — I10 Essential (primary) hypertension: Secondary | ICD-10-CM | POA: Diagnosis not present

## 2018-02-19 DIAGNOSIS — Z6828 Body mass index (BMI) 28.0-28.9, adult: Secondary | ICD-10-CM | POA: Diagnosis not present

## 2018-02-19 DIAGNOSIS — E663 Overweight: Secondary | ICD-10-CM | POA: Diagnosis not present

## 2018-02-19 DIAGNOSIS — R42 Dizziness and giddiness: Secondary | ICD-10-CM | POA: Diagnosis not present

## 2018-02-19 DIAGNOSIS — R51 Headache: Secondary | ICD-10-CM | POA: Diagnosis not present

## 2018-02-19 DIAGNOSIS — R69 Illness, unspecified: Secondary | ICD-10-CM | POA: Diagnosis not present

## 2018-02-21 ENCOUNTER — Other Ambulatory Visit: Payer: Self-pay

## 2018-02-21 ENCOUNTER — Ambulatory Visit: Payer: Medicare HMO | Admitting: Vascular Surgery

## 2018-02-21 ENCOUNTER — Encounter: Payer: Self-pay | Admitting: Vascular Surgery

## 2018-02-21 ENCOUNTER — Ambulatory Visit (HOSPITAL_COMMUNITY)
Admission: RE | Admit: 2018-02-21 | Discharge: 2018-02-21 | Disposition: A | Discharge status: Home / Self Care | Payer: Medicare HMO | Source: Ambulatory Visit | Attending: Vascular Surgery | Admitting: Vascular Surgery

## 2018-02-21 VITALS — BP 107/73 | HR 69 | Temp 98.1°F | Resp 18 | Ht 72.0 in | Wt 202.0 lb

## 2018-02-21 DIAGNOSIS — I83899 Varicose veins of unspecified lower extremities with other complications: Secondary | ICD-10-CM

## 2018-02-21 DIAGNOSIS — I83812 Varicose veins of left lower extremities with pain: Secondary | ICD-10-CM

## 2018-02-21 DIAGNOSIS — R609 Edema, unspecified: Secondary | ICD-10-CM | POA: Diagnosis present

## 2018-02-21 DIAGNOSIS — I8392 Asymptomatic varicose veins of left lower extremity: Secondary | ICD-10-CM | POA: Insufficient documentation

## 2018-02-21 NOTE — Progress Notes (Signed)
Patient name: Jerome [name]. MRN: [id number] DOB: [birth year] Sex: male    HPI: Jerome [name]. is a 57 y.o. male who previously underwent left greater saphenous laser ablation by Dr. Kellie Simmering in 2016.  He also had stab avulsions performed at that time.  He returns today with new recurrences of varicosities primarily along the lateral aspect of his left leg.  He also has significant chronic back pain.  He has a history of neuropathy from that.  He has a history of pain in the left leg secondary to this.  He presented to the office today with concerns on whether or not the varicose vein or his chronic back issues are causing the pain in his left leg.  He states that the pain can occur whether it is day or night at the beginning of the day or the at the end of the day.  He does not really describe a heavy full aching sensation classically described with vein pain.  Other medical problems include hypertension thrombocytopenia anxiety and degenerative joint disease.  These are all currently stable.  Past Medical History:  Diagnosis Date  . Anxiety   . DJD (degenerative joint disease)   . H/O cardiovascular stress test 2013  . H/O echocardiogram 2012   for cad, htn & obesity  . Hypertension   . Obesity   . Spinal stenosis   . Thrombocytopenia ([place])    Past Surgical History:  Procedure Laterality Date  . BACK SURGERY     x 4  . BREAST REDUCTION SURGERY    . CARDIAC CATHETERIZATION  2011   5 frech sheath  . COLONOSCOPY N/A 10/14/2012   SPQ:ZRAQTM rectum and colon  . CYST EXCISION Right 08/08/2016   Procedure: EXCISION OF RIGHT INGUINAL SUBCUTANEOUS MASS (LIKELY SEBACEIYS CYST);  Surgeon: Vickie Epley, MD;  Location: [facility];  Service: General;  Laterality: Right;  7cm x 4cm mass; dermabond dressing  . FLEXIBLE SIGMOIDOSCOPY N/A 10/15/2013   Procedure: FLEXIBLE SIGMOIDOSCOPY;  Surgeon: Daneil Dolin, MD;  Location: [facility];  Service: Endoscopy;  Laterality: N/A;  8:30  . h/o  cardiac cath    . LAPAROSCOPIC GASTRIC SLEEVE RESECTION  06/2012   Dr Mickie Bail [facility])    Family History  Problem Relation Age of Onset  . Heart attack Father   . Colon cancer Paternal Grandmother   . Brain cancer Brother 32    SOCIAL HISTORY: Social History   Socioeconomic History  . Marital status: Divorced    Spouse name: Not on file  . Number of children: 2  . Years of education: Not on file  . Highest education level: Not on file  Occupational History  . 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 Financial resource strain: Not on file  . Food insecurity:    Worry: Not on file    Inability: Not on file  . Transportation needs:    Medical: Not on file    Non-medical: Not on file  Tobacco Use  . Smoking status: Former Smoker    Packs/day: 0.50    Types: Cigarettes    Last attempt to quit: 06/12/2012    Years since quitting: 5.6  . Smokeless tobacco: Never Used  Substance and Sexual Activity  . Alcohol use: Yes    Alcohol/week: 0.0 oz    Comment: occasionally   . Drug use: No  . Sexual activity: Yes    Partners: Male  Lifestyle  .  Physical activity:    Days per week: Not on file    Minutes per session: Not on file  . Stress: Not on file  Relationships  . Social connections:    Talks on phone: Not on file    Gets together: Not on file    Attends religious service: Not on file    Active member of club or organization: Not on file    Attends meetings of clubs or organizations: Not on file    Relationship status: Not on file  . Intimate partner violence:    Fear of current or ex partner: Not on file    Emotionally abused: Not on file    Physically abused: Not on file    Forced sexual activity: Not on file  Other Topics Concern  . Not on file  Social History Narrative   Moving to [place] to care for brother w/ brain tumor   Lives w/ 2 daughters & 4 grandsons    Allergies  Allergen Reactions  . Aspirin Nausea And Vomiting    Due  to gastric sleeve surgery   . Codeine Nausea And Vomiting  . Nsaids Anxiety    Due to gastric sleeve surgery  N/V    Current Outpatient Medications  Medication Sig Dispense Refill  . acetaminophen (TYLENOL) 500 MG tablet Take 1,000 mg by mouth 2 (two) times daily as needed for moderate pain or headache.     . Alpha-Lipoic Acid 600 MG CAPS Take 600 mg by mouth daily.     . B COMPLEX VITAMINS SL Place 1 Dose under the tongue daily. Liquid    . Calcium Carb-Cholecalciferol (CALCIUM/VITAMIN D PO) Take 1 tablet by mouth daily.    . cetirizine (ZYRTEC) 10 MG tablet Take 10 mg by mouth as needed for allergies.    . clonazePAM (KLONOPIN) 1 MG tablet Take 1 mg by mouth 2 (two) times daily.     . cyclobenzaprine (FLEXERIL) 5 MG tablet Take 5 mg by mouth 3 (three) times daily as needed for muscle spasms.     Marland Kitchen FIBER PO Take 4 tablets by mouth daily.    . Flaxseed, Linseed, (FLAX SEED OIL PO) Take 1 tablet by mouth daily.     . fluticasone (FLONASE) 50 MCG/ACT nasal spray Place 2 sprays into both nostrils daily as needed for congestion.  5  . HYDROcodone-acetaminophen (NORCO/VICODIN) 5-325 MG tablet Take by mouth.    . losartan (COZAAR) 50 MG tablet Take 25 mg by mouth daily.  3  . lubiprostone (AMITIZA) 24 MCG capsule TAKE 1 CAPSULE BY MOUTH TWICE DAILY WITH FOOD 60 capsule 5  . Multiple Vitamin (MULTIVITAMIN WITH MINERALS) TABS tablet Take 1 tablet by mouth daily.    Marland Kitchen omeprazole (PRILOSEC) 20 MG capsule Take 20 mg by mouth daily.     . pravastatin (PRAVACHOL) 40 MG tablet Take 40 mg by mouth daily.    Marland Kitchen senna (SENOKOT) 8.6 MG tablet Take 3 tablets by mouth daily. Alternates with dulcolax    . sertraline (ZOLOFT) 100 MG tablet Take 50 mg by mouth daily.     Marland Kitchen zinc gluconate 50 MG tablet Take 50 mg by mouth daily.    Marland Kitchen ECHINACEA PO Take 1 capsule by mouth daily.    . valACYclovir (VALTREX) 1000 MG tablet Take 1,000 mg by mouth 3 (three) times daily.     No current facility-administered  medications for this visit.     ROS:   General:  No weight loss, Fever,  chills  HEENT: No recent headaches, no nasal bleeding, no visual changes, no sore throat  Neurologic: No dizziness, blackouts, seizures. No recent symptoms of stroke or mini- stroke. No recent episodes of slurred speech, or temporary blindness.  Cardiac: No recent episodes of chest pain/pressure, no shortness of breath at rest.  No shortness of breath with exertion.  Denies history of atrial fibrillation or irregular heartbeat  Vascular: No history of rest pain in feet.  No history of claudication.  No history of non-healing ulcer, No history of DVT   Pulmonary: No home oxygen, no productive cough, no hemoptysis,  No asthma or wheezing  Musculoskeletal:  [X]  Arthritis, [X]  Low back pain,  [X]  Joint pain  Hematologic:No history of hypercoagulable state.  No history of easy bleeding.  No history of anemia  Gastrointestinal: No hematochezia or melena,  No gastroesophageal reflux, no trouble swallowing  Urinary: [ ]  chronic Kidney disease, [ ]  on HD - [ ]  MWF or [ ]  TTHS, [ ]  Burning with urination, [ ]  Frequent urination, [ ]  Difficulty urinating;   Skin: No rashes  Psychological: No history of anxiety,  No history of depression   Physical Examination  Vitals:   02/21/18 1426  BP: 107/73  Pulse: 69  Resp: 18  Temp: 98.1 F (36.7 C)  TempSrc: Oral  SpO2: 96%  Weight: 202 lb (91.6 kg)  Height: 6' (1.829 m)    Body mass index is 27.4 kg/m.  General:  Alert and oriented, no acute distress Skin: No rash, no ulcer, palpable varicosities extending from the posterior leg laterally 5 to 7 mm diameter Extremity Pulses:  2+ radial, brachial, femoral, dorsalis pedis, posterior tibial pulses bilaterally Musculoskeletal: No deformity trace ankle and pedal edema bilaterally  Neurologic: Upper and lower extremity motor 5/5 and symmetric, hyperesthetic skin left leg below the knee  DATA:  Patient had a venous  reflux exam today.  This showed the greater saphenous vein is still well obliterated.  There was a lateral varicosity that drains into a bifid superficial femoral vein in the mid thigh.  There was also reflux in the common femoral vein.  Reviewed and interpreted the study.  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   PLAN: I discussed with the patient the possibility of stab avulsions of this recurrent varicose vein but was not completely convinced that all of his pain symptoms are due to this.  He has a significant neuropathy component.  I believe the best option at this point will be to place the patient back in long leg lower extremity compression stockings to see if he gets symptomatic relief from this.  If compression stockings resolve all of his symptoms we could consider whether or not to do stab avulsions again I would only consider this if the compression stockings relieve his pain symptoms.  Patient will follow-up with [name] on an as-needed basis.  Ruta Hinds, MD [facility] Office: [phone] Pager: [phone]    Ruta Hinds, MD [facility]: [phone] Pager: [phone]

## 2018-02-22 ENCOUNTER — Other Ambulatory Visit: Payer: Self-pay | Admitting: Acute Care

## 2018-02-22 DIAGNOSIS — Z122 Encounter for screening for malignant neoplasm of respiratory organs: Secondary | ICD-10-CM

## 2018-02-22 DIAGNOSIS — F17210 Nicotine dependence, cigarettes, uncomplicated: Secondary | ICD-10-CM

## 2018-03-04 ENCOUNTER — Encounter: Payer: Self-pay | Admitting: Acute Care

## 2018-03-04 ENCOUNTER — Ambulatory Visit (INDEPENDENT_AMBULATORY_CARE_PROVIDER_SITE_OTHER): Payer: Medicare HMO | Admitting: Acute Care

## 2018-03-04 ENCOUNTER — Ambulatory Visit (INDEPENDENT_AMBULATORY_CARE_PROVIDER_SITE_OTHER)
Admission: RE | Admit: 2018-03-04 | Discharge: 2018-03-04 | Disposition: A | Discharge status: Home / Self Care | Payer: Medicare HMO | Source: Ambulatory Visit | Attending: Acute Care | Admitting: Acute Care

## 2018-03-04 DIAGNOSIS — Z87891 Personal history of nicotine dependence: Secondary | ICD-10-CM | POA: Diagnosis not present

## 2018-03-04 DIAGNOSIS — F17210 Nicotine dependence, cigarettes, uncomplicated: Secondary | ICD-10-CM | POA: Diagnosis not present

## 2018-03-04 DIAGNOSIS — Z122 Encounter for screening for malignant neoplasm of respiratory organs: Secondary | ICD-10-CM

## 2018-03-04 DIAGNOSIS — R69 Illness, unspecified: Secondary | ICD-10-CM | POA: Diagnosis not present

## 2018-03-04 NOTE — Progress Notes (Signed)
Shared Decision Making Visit Lung Cancer Screening Program [phone])   Eligibility:  Age 57 y.o.  Pack Years Smoking History Calculation 72 pack year smoking history (# packs/per year x # years smoked)  Recent History of coughing up blood  no  Unexplained weight loss? no ( >Than 15 pounds within the last 6 months )  Prior History Lung / other cancer no (Diagnosis within the last 5 years already requiring surveillance chest CT Scans).  Smoking Status Current Smoker  Former Smokers: Years since quit: NA  Quit Date: NA  Visit Components:  Discussion included one or more decision making aids. yes  Discussion included risk/benefits of screening. yes  Discussion included potential follow up diagnostic testing for abnormal scans. yes  Discussion included meaning and risk of over diagnosis. yes  Discussion included meaning and risk of False Positives. yes  Discussion included meaning of total radiation exposure. yes  Counseling Included:  Importance of adherence to annual lung cancer LDCT screening. yes  Impact of comorbidities on ability to participate in the program. yes  Ability and willingness to under diagnostic treatment. yes  Smoking Cessation Counseling:  Current Smokers:   Discussed importance of smoking cessation. yes  Information about tobacco cessation classes and interventions provided to patient. yes  Patient provided with "ticket" for LDCT Scan. yes  Symptomatic Patient. no  CounselingNA  Diagnosis Code: Tobacco Use Z72.0  Asymptomatic Patient yes  Counseling (Intermediate counseling: > three minutes counseling) [id number]  Former Smokers:   Discussed the importance of maintaining cigarette abstinence. yes  Diagnosis Code: Personal History of Nicotine Dependence. Y50.354  Information about tobacco cessation classes and interventions provided to patient. Yes  Patient provided with "ticket" for LDCT Scan. yes  Written Order for Lung Cancer  Screening with LDCT placed in Epic. Yes (CT Chest Lung Cancer Screening Low Dose W/O CM) [id number] Z12.2-Screening of respiratory organs Z87.891-Personal history of nicotine dependence  I have spent 25 minutes of face to face time with Jerome [name] discussing the risks and benefits of lung cancer screening. We viewed a power point together that explained in detail the above noted topics. We paused at intervals to allow for questions to be asked and answered to ensure understanding.We discussed that the single most powerful action that he can take to decrease his risk of developing lung cancer is to quit smoking. He is currently not ready to set a quit date. We discussed whether or not he is ready to commit to setting a quit date. We discussed options for tools to aid in quitting smoking including nicotine replacement therapy, non-nicotine medications, support groups, Quit Smart classes, and behavior modification. We discussed that often times setting smaller, more achievable goals, such as eliminating 1 cigarette a day for a week and then 2 cigarettes a day for a week can be helpful in slowly decreasing the number of cigarettes smoked. This allows for a sense of accomplishment as well as providing a clinical benefit. I gave him the " Be Stronger Than Your Excuses" card with contact information for community resources, classes, free nicotine replacement therapy, and access to mobile apps, text messaging, and on-line smoking cessation help. I have also given him my card and contact information in the event he needs to contact me. We discussed the time and location of the scan, and that either Doroteo Glassman RN or I will call with the results within 24-48 hours of receiving them. I have offered him  a copy of the power point we viewed  as a resource in the event they need reinforcement of the concepts we discussed today in the office. The patient verbalized understanding of all of  the above and had no further questions  upon leaving the office. They have my contact information in the event they have any further questions.  I spent 3 minutes counseling on smoking cessation and the health risks of continued tobacco abuse.  I explained to the patient that there has been a high incidence of coronary artery disease noted on these exams. I explained that this is a non-gated exam therefore degree or severity cannot be determined. This patient is currently on statin therapy. I have asked the patient to follow-up with their PCP regarding any incidental finding of coronary artery disease and management with diet or medication as their PCP  feels is clinically indicated. The patient verbalized understanding of the above and had no further questions upon completion of the visit.      Jerome Spatz, NP 03/04/2018 3:35 PM

## 2018-03-12 ENCOUNTER — Other Ambulatory Visit: Payer: Self-pay | Admitting: Acute Care

## 2018-03-12 DIAGNOSIS — Z122 Encounter for screening for malignant neoplasm of respiratory organs: Secondary | ICD-10-CM

## 2018-03-12 DIAGNOSIS — F17210 Nicotine dependence, cigarettes, uncomplicated: Secondary | ICD-10-CM

## 2018-03-14 DIAGNOSIS — M539 Dorsopathy, unspecified: Secondary | ICD-10-CM | POA: Diagnosis not present

## 2018-03-14 DIAGNOSIS — M792 Neuralgia and neuritis, unspecified: Secondary | ICD-10-CM | POA: Diagnosis not present

## 2018-03-14 DIAGNOSIS — M5417 Radiculopathy, lumbosacral region: Secondary | ICD-10-CM | POA: Diagnosis not present

## 2018-03-22 DIAGNOSIS — R69 Illness, unspecified: Secondary | ICD-10-CM | POA: Diagnosis not present

## 2018-04-17 DIAGNOSIS — M4712 Other spondylosis with myelopathy, cervical region: Secondary | ICD-10-CM | POA: Diagnosis not present

## 2018-04-17 DIAGNOSIS — M4714 Other spondylosis with myelopathy, thoracic region: Secondary | ICD-10-CM | POA: Diagnosis not present

## 2018-04-17 DIAGNOSIS — M48061 Spinal stenosis, lumbar region without neurogenic claudication: Secondary | ICD-10-CM | POA: Diagnosis not present

## 2018-04-17 DIAGNOSIS — M539 Dorsopathy, unspecified: Secondary | ICD-10-CM | POA: Diagnosis not present

## 2018-04-17 DIAGNOSIS — M4716 Other spondylosis with myelopathy, lumbar region: Secondary | ICD-10-CM | POA: Diagnosis not present

## 2018-04-24 DIAGNOSIS — K219 Gastro-esophageal reflux disease without esophagitis: Secondary | ICD-10-CM | POA: Diagnosis not present

## 2018-04-24 DIAGNOSIS — I10 Essential (primary) hypertension: Secondary | ICD-10-CM | POA: Diagnosis not present

## 2018-04-24 DIAGNOSIS — Z6827 Body mass index (BMI) 27.0-27.9, adult: Secondary | ICD-10-CM | POA: Diagnosis not present

## 2018-04-24 DIAGNOSIS — E782 Mixed hyperlipidemia: Secondary | ICD-10-CM | POA: Diagnosis not present

## 2018-04-24 DIAGNOSIS — Z23 Encounter for immunization: Secondary | ICD-10-CM | POA: Diagnosis not present

## 2018-04-24 DIAGNOSIS — E6609 Other obesity due to excess calories: Secondary | ICD-10-CM | POA: Diagnosis not present

## 2018-04-24 DIAGNOSIS — Z0001 Encounter for general adult medical examination with abnormal findings: Secondary | ICD-10-CM | POA: Diagnosis not present

## 2018-04-24 DIAGNOSIS — G4733 Obstructive sleep apnea (adult) (pediatric): Secondary | ICD-10-CM | POA: Diagnosis not present

## 2018-04-24 DIAGNOSIS — K5909 Other constipation: Secondary | ICD-10-CM | POA: Diagnosis not present

## 2018-04-24 DIAGNOSIS — Z1389 Encounter for screening for other disorder: Secondary | ICD-10-CM | POA: Diagnosis not present

## 2018-05-14 DIAGNOSIS — D689 Coagulation defect, unspecified: Secondary | ICD-10-CM | POA: Diagnosis not present

## 2018-05-14 DIAGNOSIS — M539 Dorsopathy, unspecified: Secondary | ICD-10-CM | POA: Diagnosis not present

## 2018-05-14 DIAGNOSIS — G8929 Other chronic pain: Secondary | ICD-10-CM | POA: Diagnosis not present

## 2018-05-14 DIAGNOSIS — M5412 Radiculopathy, cervical region: Secondary | ICD-10-CM | POA: Diagnosis not present

## 2018-05-14 DIAGNOSIS — M5417 Radiculopathy, lumbosacral region: Secondary | ICD-10-CM | POA: Diagnosis not present

## 2018-05-14 DIAGNOSIS — M542 Cervicalgia: Secondary | ICD-10-CM | POA: Diagnosis not present

## 2018-05-14 DIAGNOSIS — M47816 Spondylosis without myelopathy or radiculopathy, lumbar region: Secondary | ICD-10-CM | POA: Diagnosis not present

## 2018-05-14 DIAGNOSIS — F418 Other specified anxiety disorders: Secondary | ICD-10-CM | POA: Diagnosis not present

## 2018-05-14 DIAGNOSIS — M792 Neuralgia and neuritis, unspecified: Secondary | ICD-10-CM | POA: Diagnosis not present

## 2018-05-14 DIAGNOSIS — Z5181 Encounter for therapeutic drug level monitoring: Secondary | ICD-10-CM | POA: Diagnosis not present

## 2018-05-14 DIAGNOSIS — M545 Low back pain: Secondary | ICD-10-CM | POA: Diagnosis not present

## 2018-05-14 DIAGNOSIS — M533 Sacrococcygeal disorders, not elsewhere classified: Secondary | ICD-10-CM | POA: Diagnosis not present

## 2018-05-14 DIAGNOSIS — R69 Illness, unspecified: Secondary | ICD-10-CM | POA: Diagnosis not present

## 2018-05-23 DIAGNOSIS — M542 Cervicalgia: Secondary | ICD-10-CM | POA: Diagnosis not present

## 2018-05-23 DIAGNOSIS — M533 Sacrococcygeal disorders, not elsewhere classified: Secondary | ICD-10-CM | POA: Diagnosis not present

## 2018-05-23 DIAGNOSIS — R531 Weakness: Secondary | ICD-10-CM | POA: Diagnosis not present

## 2018-05-23 DIAGNOSIS — M792 Neuralgia and neuritis, unspecified: Secondary | ICD-10-CM | POA: Diagnosis not present

## 2018-05-23 DIAGNOSIS — M5412 Radiculopathy, cervical region: Secondary | ICD-10-CM | POA: Diagnosis not present

## 2018-05-29 DIAGNOSIS — M542 Cervicalgia: Secondary | ICD-10-CM | POA: Diagnosis not present

## 2018-05-29 DIAGNOSIS — M792 Neuralgia and neuritis, unspecified: Secondary | ICD-10-CM | POA: Diagnosis not present

## 2018-05-29 DIAGNOSIS — M533 Sacrococcygeal disorders, not elsewhere classified: Secondary | ICD-10-CM | POA: Diagnosis not present

## 2018-05-29 DIAGNOSIS — M5412 Radiculopathy, cervical region: Secondary | ICD-10-CM | POA: Diagnosis not present

## 2018-05-29 DIAGNOSIS — R531 Weakness: Secondary | ICD-10-CM | POA: Diagnosis not present

## 2018-06-05 DIAGNOSIS — M5412 Radiculopathy, cervical region: Secondary | ICD-10-CM | POA: Diagnosis not present

## 2018-06-05 DIAGNOSIS — R531 Weakness: Secondary | ICD-10-CM | POA: Diagnosis not present

## 2018-06-05 DIAGNOSIS — M533 Sacrococcygeal disorders, not elsewhere classified: Secondary | ICD-10-CM | POA: Diagnosis not present

## 2018-06-05 DIAGNOSIS — M792 Neuralgia and neuritis, unspecified: Secondary | ICD-10-CM | POA: Diagnosis not present

## 2018-06-05 DIAGNOSIS — M542 Cervicalgia: Secondary | ICD-10-CM | POA: Diagnosis not present

## 2018-06-10 DIAGNOSIS — M47815 Spondylosis without myelopathy or radiculopathy, thoracolumbar region: Secondary | ICD-10-CM | POA: Diagnosis not present

## 2018-06-10 DIAGNOSIS — Z9689 Presence of other specified functional implants: Secondary | ICD-10-CM | POA: Diagnosis not present

## 2018-06-10 DIAGNOSIS — M545 Low back pain: Secondary | ICD-10-CM | POA: Diagnosis not present

## 2018-06-10 DIAGNOSIS — M549 Dorsalgia, unspecified: Secondary | ICD-10-CM | POA: Diagnosis not present

## 2018-06-10 DIAGNOSIS — D689 Coagulation defect, unspecified: Secondary | ICD-10-CM | POA: Diagnosis not present

## 2018-06-10 DIAGNOSIS — M4185 Other forms of scoliosis, thoracolumbar region: Secondary | ICD-10-CM | POA: Diagnosis not present

## 2018-06-10 DIAGNOSIS — Z792 Long term (current) use of antibiotics: Secondary | ICD-10-CM | POA: Diagnosis not present

## 2018-06-10 DIAGNOSIS — M792 Neuralgia and neuritis, unspecified: Secondary | ICD-10-CM | POA: Diagnosis not present

## 2018-06-12 DIAGNOSIS — R69 Illness, unspecified: Secondary | ICD-10-CM | POA: Diagnosis not present

## 2018-06-12 DIAGNOSIS — E663 Overweight: Secondary | ICD-10-CM | POA: Diagnosis not present

## 2018-06-12 DIAGNOSIS — Z6827 Body mass index (BMI) 27.0-27.9, adult: Secondary | ICD-10-CM | POA: Diagnosis not present

## 2018-06-17 DIAGNOSIS — M419 Scoliosis, unspecified: Secondary | ICD-10-CM | POA: Diagnosis not present

## 2018-06-25 DIAGNOSIS — M792 Neuralgia and neuritis, unspecified: Secondary | ICD-10-CM | POA: Diagnosis not present

## 2018-06-25 DIAGNOSIS — M5417 Radiculopathy, lumbosacral region: Secondary | ICD-10-CM | POA: Diagnosis not present

## 2018-06-25 DIAGNOSIS — M5489 Other dorsalgia: Secondary | ICD-10-CM | POA: Diagnosis not present

## 2018-06-25 DIAGNOSIS — G894 Chronic pain syndrome: Secondary | ICD-10-CM | POA: Diagnosis not present

## 2018-08-14 ENCOUNTER — Other Ambulatory Visit: Payer: Self-pay | Admitting: Nurse Practitioner

## 2018-08-20 DIAGNOSIS — M5417 Radiculopathy, lumbosacral region: Secondary | ICD-10-CM | POA: Diagnosis not present

## 2018-08-20 DIAGNOSIS — M545 Low back pain: Secondary | ICD-10-CM | POA: Diagnosis not present

## 2018-08-20 DIAGNOSIS — M542 Cervicalgia: Secondary | ICD-10-CM | POA: Diagnosis not present

## 2018-08-20 DIAGNOSIS — M792 Neuralgia and neuritis, unspecified: Secondary | ICD-10-CM | POA: Diagnosis not present

## 2018-08-20 DIAGNOSIS — M549 Dorsalgia, unspecified: Secondary | ICD-10-CM | POA: Diagnosis not present

## 2018-08-20 DIAGNOSIS — G8929 Other chronic pain: Secondary | ICD-10-CM | POA: Diagnosis not present

## 2018-08-20 DIAGNOSIS — M539 Dorsopathy, unspecified: Secondary | ICD-10-CM | POA: Diagnosis not present

## 2018-08-20 DIAGNOSIS — G894 Chronic pain syndrome: Secondary | ICD-10-CM | POA: Diagnosis not present

## 2018-08-20 DIAGNOSIS — D689 Coagulation defect, unspecified: Secondary | ICD-10-CM | POA: Diagnosis not present

## 2018-08-20 DIAGNOSIS — M5412 Radiculopathy, cervical region: Secondary | ICD-10-CM | POA: Diagnosis not present

## 2018-08-20 DIAGNOSIS — Z5181 Encounter for therapeutic drug level monitoring: Secondary | ICD-10-CM | POA: Diagnosis not present

## 2018-09-03 DIAGNOSIS — M542 Cervicalgia: Secondary | ICD-10-CM | POA: Diagnosis not present

## 2018-09-03 DIAGNOSIS — G8929 Other chronic pain: Secondary | ICD-10-CM | POA: Diagnosis not present

## 2018-09-03 DIAGNOSIS — M5417 Radiculopathy, lumbosacral region: Secondary | ICD-10-CM | POA: Diagnosis not present

## 2018-09-03 DIAGNOSIS — M545 Low back pain: Secondary | ICD-10-CM | POA: Diagnosis not present

## 2018-09-03 DIAGNOSIS — M5412 Radiculopathy, cervical region: Secondary | ICD-10-CM | POA: Diagnosis not present

## 2018-09-03 DIAGNOSIS — D689 Coagulation defect, unspecified: Secondary | ICD-10-CM | POA: Diagnosis not present

## 2018-09-12 DIAGNOSIS — R51 Headache: Secondary | ICD-10-CM | POA: Diagnosis not present

## 2018-09-12 DIAGNOSIS — E663 Overweight: Secondary | ICD-10-CM | POA: Diagnosis not present

## 2018-09-12 DIAGNOSIS — Z6828 Body mass index (BMI) 28.0-28.9, adult: Secondary | ICD-10-CM | POA: Diagnosis not present

## 2018-09-12 DIAGNOSIS — R69 Illness, unspecified: Secondary | ICD-10-CM | POA: Diagnosis not present

## 2018-10-22 DIAGNOSIS — R69 Illness, unspecified: Secondary | ICD-10-CM | POA: Diagnosis not present

## 2018-11-19 DIAGNOSIS — R69 Illness, unspecified: Secondary | ICD-10-CM | POA: Diagnosis not present

## 2018-11-19 DIAGNOSIS — F418 Other specified anxiety disorders: Secondary | ICD-10-CM | POA: Diagnosis not present

## 2018-11-25 DIAGNOSIS — M5412 Radiculopathy, cervical region: Secondary | ICD-10-CM | POA: Diagnosis not present

## 2018-12-04 DIAGNOSIS — K219 Gastro-esophageal reflux disease without esophagitis: Secondary | ICD-10-CM | POA: Diagnosis not present

## 2018-12-04 DIAGNOSIS — M5481 Occipital neuralgia: Secondary | ICD-10-CM | POA: Diagnosis not present

## 2018-12-04 DIAGNOSIS — G894 Chronic pain syndrome: Secondary | ICD-10-CM | POA: Diagnosis not present

## 2018-12-04 DIAGNOSIS — R69 Illness, unspecified: Secondary | ICD-10-CM | POA: Diagnosis not present

## 2018-12-17 DIAGNOSIS — R69 Illness, unspecified: Secondary | ICD-10-CM | POA: Diagnosis not present

## 2018-12-23 DIAGNOSIS — D696 Thrombocytopenia, unspecified: Secondary | ICD-10-CM | POA: Diagnosis not present

## 2018-12-24 DIAGNOSIS — M533 Sacrococcygeal disorders, not elsewhere classified: Secondary | ICD-10-CM | POA: Diagnosis not present

## 2018-12-24 DIAGNOSIS — D696 Thrombocytopenia, unspecified: Secondary | ICD-10-CM | POA: Diagnosis not present

## 2018-12-24 DIAGNOSIS — Z9884 Bariatric surgery status: Secondary | ICD-10-CM | POA: Diagnosis not present

## 2018-12-25 DIAGNOSIS — Z5181 Encounter for therapeutic drug level monitoring: Secondary | ICD-10-CM | POA: Diagnosis not present

## 2019-01-02 ENCOUNTER — Encounter: Payer: Self-pay | Admitting: Student

## 2019-01-02 ENCOUNTER — Other Ambulatory Visit: Payer: Self-pay

## 2019-01-02 ENCOUNTER — Ambulatory Visit (INDEPENDENT_AMBULATORY_CARE_PROVIDER_SITE_OTHER): Payer: Medicare HMO | Admitting: Student

## 2019-01-02 VITALS — BP 105/72 | HR 85 | Temp 97.1°F | Ht 72.0 in

## 2019-01-02 DIAGNOSIS — R002 Palpitations: Secondary | ICD-10-CM

## 2019-01-02 DIAGNOSIS — R55 Syncope and collapse: Secondary | ICD-10-CM

## 2019-01-02 DIAGNOSIS — G4730 Sleep apnea, unspecified: Secondary | ICD-10-CM | POA: Diagnosis not present

## 2019-01-02 DIAGNOSIS — I10 Essential (primary) hypertension: Secondary | ICD-10-CM | POA: Diagnosis not present

## 2019-01-02 MED ORDER — LOSARTAN POTASSIUM 25 MG PO TABS: 12.5000 mg | ORAL_TABLET | Freq: Every day | ORAL | 3 refills | Status: AC

## 2019-01-02 NOTE — Progress Notes (Signed)
Cardiology Office Note    Date:  01/03/2019   ID:  [name], DOB 1960/11/05, MRN [id number]  PCP:  Redmond School, MD  Cardiologist: Kate Sable, MD    Chief Complaint  Patient presents with  . Follow-up    Dizziness    History of Present Illness:    Jerome [name]. is a 58 y.o. male with past medical history of HTN, HLD, cervical radiculopathy, and OSA who presents to the office today for evaluation of dizziness and low-BP.  He was last examined by Dr. Bronson Ing in 07/2017 and reported episodes of "bubbles floating in his chest" but denied any associated symptoms at that time.  No further cardiac testing was indicated at that time and he was informed to follow-up as needed.  In talking with the patient today, he reports a variety of symptoms. He says he is not active at baseline secondary to DJD and has to undergo spinal injections every few months for symptom relief. He has reduced the amount of pain medication he takes regularly through practicing meditation. Given his spinal issues, he has decreased balance and utilizes a cane when walking. This has led to a gradual decline in his overall activity level as he is concerned with falling.   Over the past few months, he reports worsening fatigue. Denies any associated chest pain or dyspnea on exertion. He does experience dizziness when changing positions and BP has been low when checked at home, at 105/72 during today's visit. Says he has lost over 100 lbs within the past 5 years and this has led to several of his medications being discontinued. Stays well-hydrated. He denies any persistent palpitations but does feel his heart race at times when changing positions and this spontaneously resolves within a few seconds.  No longer on BiPAP or CPAP as repeat sleep study in 2017 demonstrated he did not need either at that time. He is concerned about no longer being on CPAP as he has been told by family members that he  snores.    Past Medical History:  Diagnosis Date  . Anxiety   . DJD (degenerative joint disease)   . H/O cardiovascular stress test 2013  . H/O echocardiogram 2012   for cad, htn & obesity  . History of cardiac catheterization    a. normal cors by cath in 2011  . Hypertension   . Obesity   . Spinal stenosis   . Thrombocytopenia ([place])     Past Surgical History:  Procedure Laterality Date  . BACK SURGERY     x 4  . BREAST REDUCTION SURGERY    . CARDIAC CATHETERIZATION  2011   5 frech sheath  . COLONOSCOPY N/A 10/14/2012   [id number] rectum and colon  . CYST EXCISION Right 08/08/2016   Procedure: EXCISION OF RIGHT INGUINAL SUBCUTANEOUS MASS (LIKELY SEBACEIYS CYST);  Surgeon: Vickie Epley, MD;  Location: [facility];  Service: General;  Laterality: Right;  7cm x 4cm mass; dermabond dressing  . FLEXIBLE SIGMOIDOSCOPY N/A 10/15/2013   Procedure: FLEXIBLE SIGMOIDOSCOPY;  Surgeon: Daneil Dolin, MD;  Location: [facility];  Service: Endoscopy;  Laterality: N/A;  8:30  . h/o cardiac cath    . LAPAROSCOPIC GASTRIC SLEEVE RESECTION  06/2012   Dr Mickie Bail [facility])    Current Medications: Outpatient Medications Prior to Visit  Medication Sig Dispense Refill  . acetaminophen (TYLENOL) 500 MG tablet Take 1,000 mg by mouth 2 (two) times daily as needed for moderate pain  or headache.     . Alpha-Lipoic Acid 600 MG CAPS Take 600 mg by mouth daily.     . B COMPLEX VITAMINS SL Place 1 Dose under the tongue daily. Liquid    . Calcium Carb-Cholecalciferol (CALCIUM/VITAMIN D PO) Take 1 tablet by mouth daily.    . cetirizine (ZYRTEC) 10 MG tablet Take 10 mg by mouth as needed for allergies.    . clonazePAM (KLONOPIN) 1 MG tablet Take 1 mg by mouth 2 (two) times daily.     . cyclobenzaprine (FLEXERIL) 5 MG tablet Take 5 mg by mouth 3 (three) times daily as needed for muscle spasms.     Marland Kitchen ECHINACEA PO Take 1 capsule by mouth daily.    Marland Kitchen FIBER PO Take 4 tablets by mouth daily.    . Flaxseed,  Linseed, (FLAX SEED OIL PO) Take 1 tablet by mouth daily.     . fluticasone (FLONASE) 50 MCG/ACT nasal spray Place 2 sprays into both nostrils daily as needed for congestion.  5  . HYDROcodone-acetaminophen (NORCO/VICODIN) 5-325 MG tablet Take by mouth every 6 (six) hours as needed.     . lubiprostone (AMITIZA) 24 MCG capsule TAKE 1 CAPSULE BY MOUTH TWICE DAILY WITH FOOD 60 capsule 5  . Multiple Vitamin (MULTIVITAMIN WITH MINERALS) TABS tablet Take 1 tablet by mouth daily.    Marland Kitchen omeprazole (PRILOSEC) 20 MG capsule Take 20 mg by mouth daily.     . pravastatin (PRAVACHOL) 40 MG tablet Take 40 mg by mouth daily.    Marland Kitchen senna (SENOKOT) 8.6 MG tablet Take 3 tablets by mouth daily. Alternates with dulcolax    . sertraline (ZOLOFT) 100 MG tablet Take 50 mg by mouth daily.     Marland Kitchen zinc gluconate 50 MG tablet Take 50 mg by mouth daily.    Marland Kitchen losartan (COZAAR) 50 MG tablet Take 25 mg by mouth daily.  3  . valACYclovir (VALTREX) 1000 MG tablet Take 1,000 mg by mouth 3 (three) times daily.     No facility-administered medications prior to visit.      Allergies:   Aspirin, Codeine, and Nsaids   Social History   Socioeconomic History  . Marital status: Divorced    Spouse name: Not on file  . Number of children: 2  . Years of education: Not on file  . Highest education level: Not on file  Occupational History  . 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 Financial resource strain: Not on file  . Food insecurity    Worry: Not on file    Inability: Not on file  . Transportation needs    Medical: Not on file    Non-medical: Not on file  Tobacco Use  . Smoking status: Current Every Day Smoker    Packs/day: 2.00    Years: 36.00    Pack years: 72.00    Types: Cigarettes  . Smokeless tobacco: Never Used  . Tobacco comment: 1 PPD 02/2018  Substance and Sexual Activity  . Alcohol use: Yes    Alcohol/week: 0.0 standard drinks    Comment: occasionally   . Drug use: No  .  Sexual activity: Yes    Partners: Male  Lifestyle  . Physical activity    Days per week: Not on file    Minutes per session: Not on file  . Stress: Not on file  Relationships  . Social Herbalist on phone: Not on file    Gets together:  Not on file    Attends religious service: Not on file    Active member of club or organization: Not on file    Attends meetings of clubs or organizations: Not on file    Relationship status: Not on file  Other Topics Concern  . Not on file  Social History Narrative   Moving to [place] to care for brother w/ brain tumor   Lives w/ 2 daughters & 4 grandsons     Family History:  The patient's family history includes Brain cancer (age of onset: 38) in his brother; Colon cancer in his paternal grandmother; Heart attack in his father.   Review of Systems:   Please see the history of present illness.     General:  No chills, fever, night sweats or weight changes.  Cardiovascular:  No chest pain, dyspnea on exertion, edema, orthopnea, paroxysmal nocturnal dyspnea. Positive for palpitations and dizziness.  Dermatological: No rash, lesions/masses Respiratory: No cough, dyspnea Urologic: No hematuria, dysuria Abdominal:   No nausea, vomiting, diarrhea, bright red blood per rectum, melena, or hematemesis Neurologic:  No visual changes, wkns, changes in mental status. All other systems reviewed and are otherwise negative except as noted above.   Physical Exam:    VS:  BP 105/72   Pulse 85   Temp (!) 97.1 F (36.2 C)   Ht 6' (1.829 m)   SpO2 98%   BMI 27.40 kg/m    General: Well developed, well nourished Caucasian male appearing in no acute distress. Head: Normocephalic, atraumatic, sclera non-icteric, no xanthomas, nares are without discharge.  Neck: No carotid bruits. JVD not elevated.  Lungs: Respirations regular and unlabored, without wheezes or rales.  Heart: Regular rate and rhythm. No S3 or S4.  No murmur, no rubs, or gallops  appreciated. Abdomen: Soft, non-tender, non-distended with normoactive bowel sounds. No hepatomegaly. No rebound/guarding. No obvious abdominal masses. Msk:  Strength and tone appear normal for age. No joint deformities or effusions. Extremities: No clubbing or cyanosis. No lower extremity edema.  Distal pedal pulses are 2+ bilaterally. Neuro: Alert and oriented X 3. Moves all extremities spontaneously. No focal deficits noted. Psych:  Responds to questions appropriately with a normal affect. Skin: No rashes or lesions noted  Wt Readings from Last 3 Encounters:  02/21/18 202 lb (91.6 kg)  12/04/17 210 lb 6.4 oz (95.4 kg)  08/08/17 211 lb (95.7 kg)        Studies/Labs Reviewed:   EKG:  EKG is not ordered today.   Recent Labs: No results found for requested labs within last 8760 hours.   Lipid Panel No results found for: CHOL, TRIG, HDL, CHOLHDL, VLDL, LDLCALC, LDLDIRECT  Additional studies/ records that were reviewed today include:   NST: 2015   Assessment:    1. Palpitations   2. Near syncope   3. Essential hypertension   4. Sleep-disordered breathing      Plan:   In order of problems listed above:  1. Palpitations/ Near-Syncope - he describes having episodes of dizziness with standing over the past few weeks and notes occasional palpitations when this occurs. Symptoms only last for a few seconds. Suspect an orthostatic component but BP only changed from 105/72 with sitting to 98/64 with standing during today's visit. Less suspicious of POTS given his demographic. Encouraged increased fluid consumption and the use of compression stockings (he already wears these intermittently for his varicose veins). Will obtain an echocardiogram for initial evaluation given his prior studies were 5+ years ago.  Will reduce Losartan from 25mg  daily to 12.5mg  daily given his low-normal BP. If palpitations worsen or do not improve with the above measures, would consider an event monitor at  the time of his follow-up visit.   2. HTN - BP has been soft as outlined above and I suspect this is contributing to his dizziness and fatigue. Currently on Losartan 25mg  daily. Will reduce dosing to 12.5mg  daily. I encouraged him to follow his readings at home and report back if they remain soft as the medication may need to be discontinued.   3. Sleep-disordered breathing - no longer on BiPAP or CPAP as repeat sleep study in 2017 demonstrated he did not need either at that time. Questions if he needs a repeat sleep study. I offered a referral back to Neurology in [place], however he also sees Neurology at [facility] as well. He will address with them at that time. I informed the patient that routine sleep studies are currently postponed given COVID-19.   Medication Adjustments/Labs and Tests Ordered: Current medicines are reviewed at length with the patient today.  Concerns regarding medicines are outlined above.  Medication changes, Labs and Tests ordered today are listed in the Patient Instructions below. Patient Instructions  Medication Instructions: DECREASE Losartan to 12.5 mg daily  Labwork: TSH  Procedures/Testing: Your physician has requested that you have an echocardiogram. Echocardiography is a painless test that uses sound waves to create images of your heart. It provides your doctor with information about the size and shape of your heart and how well your heart's chambers and valves are working. This procedure takes approximately one hour. There are no restrictions for this procedure.  Follow-Up: 2-3 months with Dr.Koneswaran  Any Additional Special Instructions Will Be Listed Below (If Applicable).  If you need a refill on your cardiac medications before your next appointment, please call your pharmacy.   Thank you for choosing [facility] !       Signed, Erma Heritage, PA-C  01/03/2019 9:53 AM    [place] [address]  [address] [place], [place] [address] Phone: [phone] Fax: [phone]

## 2019-01-02 NOTE — Patient Instructions (Signed)
Medication Instructions: DECREASE Losartan to 12.5 mg daily  Labwork: TSH  Procedures/Testing: Your physician has requested that you have an echocardiogram. Echocardiography is a painless test that uses sound waves to create images of your heart. It provides your doctor with information about the size and shape of your heart and how well your heart's chambers and valves are working. This procedure takes approximately one hour. There are no restrictions for this procedure.    Follow-Up: 2-3 months with Dr.Koneswaran   Any Additional Special Instructions Will Be Listed Below (If Applicable).     If you need a refill on your cardiac medications before your next appointment, please call your pharmacy.        Thank you for choosing [facility] !

## 2019-01-03 ENCOUNTER — Encounter: Payer: Self-pay | Admitting: Student

## 2019-01-14 DIAGNOSIS — R69 Illness, unspecified: Secondary | ICD-10-CM | POA: Diagnosis not present

## 2019-01-14 DIAGNOSIS — F418 Other specified anxiety disorders: Secondary | ICD-10-CM | POA: Diagnosis not present

## 2019-01-21 ENCOUNTER — Other Ambulatory Visit: Payer: Self-pay

## 2019-01-21 ENCOUNTER — Other Ambulatory Visit: Payer: Medicare HMO

## 2019-01-21 DIAGNOSIS — Z20822 Contact with and (suspected) exposure to covid-19: Secondary | ICD-10-CM

## 2019-01-21 DIAGNOSIS — R6889 Other general symptoms and signs: Secondary | ICD-10-CM | POA: Diagnosis not present

## 2019-01-27 ENCOUNTER — Ambulatory Visit (HOSPITAL_COMMUNITY)
Admission: RE | Admit: 2019-01-27 | Discharge: 2019-01-27 | Disposition: A | Discharge status: Home / Self Care | Payer: Medicare HMO | Source: Ambulatory Visit | Attending: Student | Admitting: Student

## 2019-01-27 ENCOUNTER — Other Ambulatory Visit: Payer: Self-pay

## 2019-01-27 ENCOUNTER — Other Ambulatory Visit (HOSPITAL_COMMUNITY)
Admission: RE | Admit: 2019-01-27 | Discharge: 2019-01-27 | Disposition: A | Discharge status: Home / Self Care | Payer: Medicare HMO | Source: Ambulatory Visit | Attending: Student | Admitting: Student

## 2019-01-27 DIAGNOSIS — R55 Syncope and collapse: Secondary | ICD-10-CM | POA: Diagnosis not present

## 2019-01-27 DIAGNOSIS — R002 Palpitations: Secondary | ICD-10-CM | POA: Insufficient documentation

## 2019-01-27 DIAGNOSIS — R69 Illness, unspecified: Secondary | ICD-10-CM | POA: Insufficient documentation

## 2019-01-27 LAB — NOVEL CORONAVIRUS, NAA: SARS-CoV-2, NAA: NOT DETECTED

## 2019-01-27 LAB — TSH: TSH: 1.193 u[IU]/mL (ref 0.350–4.500)

## 2019-01-27 NOTE — Progress Notes (Signed)
*  PRELIMINARY RESULTS* Echocardiogram 2D Echocardiogram has been performed.  Jerome [name] 01/27/2019, 1:56 PM

## 2019-02-11 DIAGNOSIS — R69 Illness, unspecified: Secondary | ICD-10-CM | POA: Diagnosis not present

## 2019-02-11 DIAGNOSIS — F418 Other specified anxiety disorders: Secondary | ICD-10-CM | POA: Diagnosis not present

## 2019-03-06 ENCOUNTER — Ambulatory Visit (HOSPITAL_COMMUNITY)
Admission: RE | Admit: 2019-03-06 | Discharge: 2019-03-06 | Disposition: A | Discharge status: Home / Self Care | Payer: Medicare HMO | Source: Ambulatory Visit | Attending: Acute Care | Admitting: Acute Care

## 2019-03-06 ENCOUNTER — Other Ambulatory Visit: Payer: Self-pay

## 2019-03-06 DIAGNOSIS — Z122 Encounter for screening for malignant neoplasm of respiratory organs: Secondary | ICD-10-CM

## 2019-03-06 DIAGNOSIS — F17210 Nicotine dependence, cigarettes, uncomplicated: Secondary | ICD-10-CM | POA: Diagnosis present

## 2019-03-06 DIAGNOSIS — R69 Illness, unspecified: Secondary | ICD-10-CM | POA: Diagnosis not present

## 2019-03-07 DIAGNOSIS — Z0100 Encounter for examination of eyes and vision without abnormal findings: Secondary | ICD-10-CM | POA: Diagnosis not present

## 2019-03-11 ENCOUNTER — Telehealth: Payer: Self-pay | Admitting: *Deleted

## 2019-03-11 DIAGNOSIS — Z122 Encounter for screening for malignant neoplasm of respiratory organs: Secondary | ICD-10-CM

## 2019-03-11 DIAGNOSIS — F17210 Nicotine dependence, cigarettes, uncomplicated: Secondary | ICD-10-CM

## 2019-03-11 NOTE — Telephone Encounter (Signed)
Please see other encounter from today 03/11/2019. Pt has been made aware of results. Nothing further needed.

## 2019-03-11 NOTE — Telephone Encounter (Signed)
Notes recorded by Magdalen Spatz, NP on 03/08/2019 at 2:31 PM EDT  Please call patient and let them know their low dose Ct was read as a Lung RADS 2: nodules that are benign in appearance and behavior with a very low likelihood of becoming a clinically active cancer due to size or lack of growth. Recommendation per radiology is for a repeat LDCT in 12 months..Please let them know we will order and schedule their annual screening scan for 02/2020. Please let them know there was notation of CAD on their scan. Please remind the patient that this is a non-gated exam therefore degree or severity of disease cannot be determined. Please have them follow up with their PCP regarding potential risk factor modification, dietary therapy or pharmacologic therapy if clinically indicated.  Pt. is currently on statin therapy. Please place order for annual screening scan for 02/2020 and fax results to PCP. Thanks so much.     Called and spoke with pt letting him know the results of the LDCT. Pt verbalized understanding and stated he had also read the results on mychart. Routing to [place] so she can place order for the scan to be repeated in 1 year. Nothing further needed.

## 2019-03-13 DIAGNOSIS — R69 Illness, unspecified: Secondary | ICD-10-CM | POA: Diagnosis not present

## 2019-03-13 DIAGNOSIS — F419 Anxiety disorder, unspecified: Secondary | ICD-10-CM | POA: Diagnosis not present

## 2019-03-14 NOTE — Addendum Note (Signed)
Addended by: Doroteo Glassman D on: 03/14/2019 08:21 AM   Modules accepted: Orders

## 2019-03-14 NOTE — Telephone Encounter (Signed)
Order placed for 1 year f/u low dose chest CT.  Results faxed to PCP.  Nothing further needed.

## 2019-03-18 DIAGNOSIS — Z5181 Encounter for therapeutic drug level monitoring: Secondary | ICD-10-CM | POA: Diagnosis not present

## 2019-03-18 DIAGNOSIS — G894 Chronic pain syndrome: Secondary | ICD-10-CM | POA: Diagnosis not present

## 2019-03-18 DIAGNOSIS — M5412 Radiculopathy, cervical region: Secondary | ICD-10-CM | POA: Diagnosis not present

## 2019-03-18 DIAGNOSIS — D689 Coagulation defect, unspecified: Secondary | ICD-10-CM | POA: Diagnosis not present

## 2019-03-18 DIAGNOSIS — G8929 Other chronic pain: Secondary | ICD-10-CM | POA: Diagnosis not present

## 2019-03-18 DIAGNOSIS — M533 Sacrococcygeal disorders, not elsewhere classified: Secondary | ICD-10-CM | POA: Diagnosis not present

## 2019-03-18 DIAGNOSIS — M545 Low back pain: Secondary | ICD-10-CM | POA: Diagnosis not present

## 2019-03-18 DIAGNOSIS — M549 Dorsalgia, unspecified: Secondary | ICD-10-CM | POA: Diagnosis not present

## 2019-03-18 DIAGNOSIS — M542 Cervicalgia: Secondary | ICD-10-CM | POA: Diagnosis not present

## 2019-03-18 DIAGNOSIS — M5417 Radiculopathy, lumbosacral region: Secondary | ICD-10-CM | POA: Diagnosis not present

## 2019-03-24 DIAGNOSIS — E782 Mixed hyperlipidemia: Secondary | ICD-10-CM | POA: Diagnosis not present

## 2019-03-24 DIAGNOSIS — I10 Essential (primary) hypertension: Secondary | ICD-10-CM | POA: Diagnosis not present

## 2019-03-24 DIAGNOSIS — G894 Chronic pain syndrome: Secondary | ICD-10-CM | POA: Diagnosis not present

## 2019-03-26 DIAGNOSIS — F418 Other specified anxiety disorders: Secondary | ICD-10-CM | POA: Diagnosis not present

## 2019-03-26 DIAGNOSIS — R69 Illness, unspecified: Secondary | ICD-10-CM | POA: Diagnosis not present

## 2019-03-27 ENCOUNTER — Encounter: Payer: Self-pay | Admitting: Cardiovascular Disease

## 2019-03-27 ENCOUNTER — Other Ambulatory Visit: Payer: Self-pay

## 2019-03-27 ENCOUNTER — Ambulatory Visit: Payer: Medicare HMO | Admitting: Cardiovascular Disease

## 2019-03-27 VITALS — BP 117/78 | HR 89 | Temp 99.2°F | Ht 72.0 in | Wt 199.0 lb

## 2019-03-27 DIAGNOSIS — R55 Syncope and collapse: Secondary | ICD-10-CM | POA: Diagnosis not present

## 2019-03-27 DIAGNOSIS — R002 Palpitations: Secondary | ICD-10-CM | POA: Diagnosis not present

## 2019-03-27 DIAGNOSIS — I10 Essential (primary) hypertension: Secondary | ICD-10-CM

## 2019-03-27 DIAGNOSIS — I83899 Varicose veins of unspecified lower extremities with other complications: Secondary | ICD-10-CM | POA: Diagnosis not present

## 2019-03-27 NOTE — Progress Notes (Signed)
SUBJECTIVE: Patient presents for follow-up of palpitations and dizziness.  He was evaluated in our office on 01/02/2019 and increased fluid consumption and use of compression stockings were recommended.  Losartan was decreased to 12.5 mg daily and an echocardiogram was ordered.  Echocardiogram on 01/27/2019 demonstrated normal LV systolic function, LVEF 60 to 65%.  Diastolic function was also normal.  There is no significant valvular pathology.  He started having elevated blood pressures and went back to taking losartan 25 mg daily.  He denies palpitations.  He has been wearing compression stocking on his left leg about 3 nights per week.  He has had episodic dizziness.  He denies syncope.  He denies chest pain and shortness of breath.  He fell from a ladder in the early 1990s and has had chronic back problems and neuropathy and radiculopathy related to this.    Review of Systems: As per "subjective", otherwise negative.  Allergies  Allergen Reactions  . Aspirin Nausea And Vomiting    Due to gastric sleeve surgery   . Codeine Nausea And Vomiting  . Nsaids Anxiety    Due to gastric sleeve surgery  N/V    Current Outpatient Medications  Medication Sig Dispense Refill  . acetaminophen (TYLENOL) 500 MG tablet Take 1,000 mg by mouth 2 (two) times daily as needed for moderate pain or headache.     . B COMPLEX VITAMINS SL Place 1 Dose under the tongue daily. Liquid    . Calcium Carb-Cholecalciferol (CALCIUM/VITAMIN D PO) Take 1 tablet by mouth daily.    . cetirizine (ZYRTEC) 10 MG tablet Take 10 mg by mouth as needed for allergies.    . clonazePAM (KLONOPIN) 1 MG tablet Take 1 mg by mouth 2 (two) times daily.     . cyclobenzaprine (FLEXERIL) 5 MG tablet Take 5 mg by mouth 3 (three) times daily as needed for muscle spasms.     Marland Kitchen ECHINACEA PO Take 1 capsule by mouth daily.    Marland Kitchen FIBER PO Take 4 tablets by mouth daily.    . Flaxseed, Linseed, (FLAX SEED OIL PO) Take 1 tablet by mouth  daily.     . fluticasone (FLONASE) 50 MCG/ACT nasal spray Place 2 sprays into both nostrils daily as needed for congestion.  5  . HYDROcodone-acetaminophen (NORCO/VICODIN) 5-325 MG tablet Take by mouth every 6 (six) hours as needed.     Marland Kitchen losartan (COZAAR) 25 MG tablet Take 0.5 tablets (12.5 mg total) by mouth daily. (Patient taking differently: Take 25 mg by mouth daily. ) 45 tablet 3  . lubiprostone (AMITIZA) 24 MCG capsule TAKE 1 CAPSULE BY MOUTH TWICE DAILY WITH FOOD 60 capsule 5  . Multiple Vitamin (MULTIVITAMIN WITH MINERALS) TABS tablet Take 1 tablet by mouth daily.    Marland Kitchen omeprazole (PRILOSEC) 20 MG capsule Take 20 mg by mouth daily.     . pravastatin (PRAVACHOL) 40 MG tablet Take 40 mg by mouth daily.    Marland Kitchen senna (SENOKOT) 8.6 MG tablet Take 3 tablets by mouth daily. Alternates with dulcolax    . sertraline (ZOLOFT) 100 MG tablet Take 50 mg by mouth daily.     Marland Kitchen zinc gluconate 50 MG tablet Take 50 mg by mouth daily.     No current facility-administered medications for this visit.     Past Medical History:  Diagnosis Date  . Anxiety   . DJD (degenerative joint disease)   . H/O cardiovascular stress test 2013  . H/O echocardiogram 2012  for cad, htn & obesity  . History of cardiac catheterization    a. normal cors by cath in 2011  . Hypertension   . Obesity   . Spinal stenosis   . Thrombocytopenia ([place])     Past Surgical History:  Procedure Laterality Date  . BACK SURGERY     x 4  . BREAST REDUCTION SURGERY    . CARDIAC CATHETERIZATION  2011   5 frech sheath  . COLONOSCOPY N/A 10/14/2012   [id number] rectum and colon  . CYST EXCISION Right 08/08/2016   Procedure: EXCISION OF RIGHT INGUINAL SUBCUTANEOUS MASS (LIKELY SEBACEIYS CYST);  Surgeon: Vickie Epley, MD;  Location: [facility];  Service: General;  Laterality: Right;  7cm x 4cm mass; dermabond dressing  . FLEXIBLE SIGMOIDOSCOPY N/A 10/15/2013   Procedure: FLEXIBLE SIGMOIDOSCOPY;  Surgeon: Daneil Dolin, MD;  [facility]:  [facility];  Service: Endoscopy;  Laterality: N/A;  8:30  . h/o cardiac cath    . LAPAROSCOPIC GASTRIC SLEEVE RESECTION  06/2012   Dr Mickie Bail [facility])    Social History   Socioeconomic History  . Marital status: Divorced    Spouse name: Not on file  . Number of children: 2  . Years of education: Not on file  . Highest education level: Not on file  Occupational History  . 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 Financial resource strain: Not on file  . Food insecurity    Worry: Not on file    Inability: Not on file  . Transportation needs    Medical: Not on file    Non-medical: Not on file  Tobacco Use  . Smoking status: Current Every Day Smoker    Packs/day: 2.00    Years: 36.00    Pack years: 72.00    Types: Cigarettes  . Smokeless tobacco: Never Used  . Tobacco comment: 1 PPD 02/2018  Substance and Sexual Activity  . Alcohol use: Yes    Alcohol/week: 0.0 standard drinks    Comment: occasionally   . Drug use: No  . Sexual activity: Yes    Partners: Male  Lifestyle  . Physical activity    Days per week: Not on file    Minutes per session: Not on file  . Stress: Not on file  Relationships  . Social Herbalist on phone: Not on file    Gets together: Not on file    Attends religious service: Not on file    Active member of club or organization: Not on file    Attends meetings of clubs or organizations: Not on file    Relationship status: Not on file  . Intimate partner violence    Fear of current or ex partner: Not on file    Emotionally abused: Not on file    Physically abused: Not on file    Forced sexual activity: Not on file  Other Topics Concern  . Not on file  Social History Narrative   Moving to [place] to care for brother w/ brain tumor   Lives w/ 2 daughters & 4 grandsons     Vitals:   03/27/19 1434  BP: 117/78  Pulse: 89  Temp: 99.2 F (37.3 C)  SpO2: 96%  Weight: 199 lb (90.3 kg)  Height: 6' (1.829 m)     Wt Readings from Last 3 Encounters:  03/27/19 199 lb (90.3 kg)  02/21/18 202 lb (91.6 kg)  12/04/17 210 lb 6.4 oz (95.4  kg)     PHYSICAL EXAM General: NAD HEENT: Normal. Neck: No JVD, no thyromegaly. Lungs: Clear to auscultation bilaterally with normal respiratory effort. CV: Regular rate and rhythm, normal S1/S2, no S3/S4, no murmur. No pretibial or periankle edema.    Abdomen: Soft, nontender, no distention.  Neurologic: Alert and oriented.  Psych: Normal affect. Skin: Normal. Musculoskeletal: No gross deformities.    ECG: Reviewed above under Subjective   Labs: Lab Results  Component Value Date/Time   K 4.6 01/03/2016 03:48 PM   BUN 17 01/03/2016 03:48 PM   CREATININE 0.73 01/03/2016 03:48 PM   ALT 21 07/27/2013 11:28 AM   TSH 1.193 01/27/2019 01:58 PM   HGB 15.6 08/08/2016 07:55 AM     Lipids: No results found for: LDLCALC, LDLDIRECT, CHOL, TRIG, HDL     ASSESSMENT AND PLAN:  1.  Palpitations and dizziness: Echocardiogram reviewed above with normal left ventricular systolic and diastolic function.  Increased fluid consumption and compression stockings have been recommended.  2.  Hypertension: Blood pressure is controlled.  No changes to therapy.  Continue losartan 12.5 mg daily.  3.  Sleep disordered breathing: Follows with neurology in [place].  4.  Left leg venous varicosities: Compression stocking has been recommended.   Disposition: Follow up as needed   Kate Sable, M.D., F.A.C.C.

## 2019-03-27 NOTE — Patient Instructions (Signed)
Medication Instructions: Your physician recommends that you continue on your current medications as directed. Please refer to the Current Medication list given to you today.   Labwork: None today  Procedures/Testing: None today  Follow-Up: As needed with Dr.Koneswaran  Any Additional Special Instructions Will Be Listed Below (If Applicable).    Thank you for choosing [facility] !        

## 2019-04-02 DIAGNOSIS — Z23 Encounter for immunization: Secondary | ICD-10-CM | POA: Diagnosis not present

## 2019-04-03 DIAGNOSIS — Z6827 Body mass index (BMI) 27.0-27.9, adult: Secondary | ICD-10-CM | POA: Diagnosis not present

## 2019-04-03 DIAGNOSIS — T50905A Adverse effect of unspecified drugs, medicaments and biological substances, initial encounter: Secondary | ICD-10-CM | POA: Diagnosis not present

## 2019-04-03 DIAGNOSIS — R51 Headache: Secondary | ICD-10-CM | POA: Diagnosis not present

## 2019-04-03 DIAGNOSIS — R69 Illness, unspecified: Secondary | ICD-10-CM | POA: Diagnosis not present

## 2019-04-21 DIAGNOSIS — D696 Thrombocytopenia, unspecified: Secondary | ICD-10-CM | POA: Diagnosis not present

## 2019-04-21 DIAGNOSIS — M533 Sacrococcygeal disorders, not elsewhere classified: Secondary | ICD-10-CM | POA: Diagnosis not present

## 2019-04-21 DIAGNOSIS — Z5181 Encounter for therapeutic drug level monitoring: Secondary | ICD-10-CM | POA: Diagnosis not present

## 2019-04-23 DIAGNOSIS — G894 Chronic pain syndrome: Secondary | ICD-10-CM | POA: Diagnosis not present

## 2019-04-23 DIAGNOSIS — E7849 Other hyperlipidemia: Secondary | ICD-10-CM | POA: Diagnosis not present

## 2019-04-23 DIAGNOSIS — I10 Essential (primary) hypertension: Secondary | ICD-10-CM | POA: Diagnosis not present

## 2019-04-28 DIAGNOSIS — E7849 Other hyperlipidemia: Secondary | ICD-10-CM | POA: Diagnosis not present

## 2019-04-28 DIAGNOSIS — I10 Essential (primary) hypertension: Secondary | ICD-10-CM | POA: Diagnosis not present

## 2019-04-28 DIAGNOSIS — Z1389 Encounter for screening for other disorder: Secondary | ICD-10-CM | POA: Diagnosis not present

## 2019-04-28 DIAGNOSIS — Z0001 Encounter for general adult medical examination with abnormal findings: Secondary | ICD-10-CM | POA: Diagnosis not present

## 2019-04-28 DIAGNOSIS — Z125 Encounter for screening for malignant neoplasm of prostate: Secondary | ICD-10-CM | POA: Diagnosis not present

## 2019-04-28 DIAGNOSIS — Z0000 Encounter for general adult medical examination without abnormal findings: Secondary | ICD-10-CM | POA: Diagnosis not present

## 2019-04-28 DIAGNOSIS — Z6827 Body mass index (BMI) 27.0-27.9, adult: Secondary | ICD-10-CM | POA: Diagnosis not present

## 2019-04-28 DIAGNOSIS — G894 Chronic pain syndrome: Secondary | ICD-10-CM | POA: Diagnosis not present

## 2019-04-28 DIAGNOSIS — R7309 Other abnormal glucose: Secondary | ICD-10-CM | POA: Diagnosis not present

## 2019-04-28 DIAGNOSIS — R69 Illness, unspecified: Secondary | ICD-10-CM | POA: Diagnosis not present

## 2019-05-24 DIAGNOSIS — I10 Essential (primary) hypertension: Secondary | ICD-10-CM | POA: Diagnosis not present

## 2019-05-24 DIAGNOSIS — R69 Illness, unspecified: Secondary | ICD-10-CM | POA: Diagnosis not present

## 2019-05-26 ENCOUNTER — Other Ambulatory Visit: Payer: Self-pay

## 2019-05-26 ENCOUNTER — Ambulatory Visit: Payer: Medicare HMO | Admitting: Neurology

## 2019-05-26 ENCOUNTER — Encounter: Payer: Self-pay | Admitting: Neurology

## 2019-05-26 VITALS — BP 121/77 | HR 72 | Temp 98.3°F | Ht 72.0 in | Wt 205.0 lb

## 2019-05-26 DIAGNOSIS — R519 Headache, unspecified: Secondary | ICD-10-CM | POA: Insufficient documentation

## 2019-05-26 DIAGNOSIS — G8921 Chronic pain due to trauma: Secondary | ICD-10-CM | POA: Diagnosis not present

## 2019-05-26 DIAGNOSIS — Z9289 Personal history of other medical treatment: Secondary | ICD-10-CM | POA: Insufficient documentation

## 2019-05-26 NOTE — Progress Notes (Signed)
SLEEP MEDICINE CLINIC    Provider:  Larey Seat, MD  Primary Care Physician:  Redmond School, [place] [id number]     Referring Provider: Redmond School, [place],  [place] [id number]          Chief Complaint according to patient   Patient presents with:    . New Patient (Initial Visit)           HISTORY OF PRESENT ILLNESS:  Jerome [name]. is a 58 y.o. year old White or Caucasian male patient seen here as a referral on 05/26/2019 from Dr. Gerarda Fraction, also a patient Dr. Bronson Ing.  Dr. Gerarda Fraction referred for occipital neuralgia - we are not following  And treating chronic pain at [facility]. I do perform these seldomly. He was followed at [facility] and has epidural injections.  He has established a different sleep and meditation regimen- and was able to reduce his opioid use drastically.    Chief concern according to patient :  The patient presents for with reports of nocturnal palpitations , dry mouth.  He is on rare opioids for treatment. He is still concerned of central apnea.  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  Dr Oneida Alar, vascular surgeon also follows him.   Notes by Dr Bronson Ing- He had a normal coronary angiogram on March 11, 2010.  He has a history of venous varicosities, hypertension, obstructive sleep apnea, and hyperlipidemia. ECG performed in the office today which I ordered and personally interpreted demonstrates normal sinus rhythm with no ischemic ST segment or T-wave abnormalities, nor any arrhythmias. He said about a month ago, he had a sensation of "bubbles floating upward in his chest ".  He said occurred while lying down and trying to meditate.  He is due for an injection in the C7-T1 region.  He had more those sensations about a month ago while cleaning his kitchen floor.  He returned from [place] yesterday where he is trying to sell a piece of  property.  He did not experience any chest pain, shortness of breath, or palpitations for the past 1 month.  He was diagnosed with shingles in September and felt he may be experiencing some neuropathic pain on his chest from this.  He also describes an sensation of dizziness and feeling like he is going to pass out if he closes his eyes while in the shower.  He denies taking hot showers. The patient had the first sleep study in the year 2017 at [facility] and meanwhile has broken his BiPAP machine, issued 2011 . He hasn't used it in over three years.    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  Pets are not present. Tobacco use- quit 2019.   ETOH use: none , Caffeine intake in form of Coffee( 2) Soda( none  ) Tea ( green) or energy drinks.  Sleep habits are as follows: The patient's dinner time is between 5 PM. The patient goes to bed at 10 PM and continues to sleep for 2 hours, wakes for one  bathroom break. The preferred sleep position is supine , with the support of 2 pillows.   6.30 AM is the usual rise time. The patient wakes up  spontaneously/ before his alarm.  He reports not feeling refreshed or restored in AM, with symptoms such as dry mouth and residual fatigue, feeling sore and achy.  Naps are taken in frequently, not scheduled.   Review of Systems: Out of a complete 14 system review, the patient complains of only the following symptoms, and all other reviewed systems are negative.:  Fatigue, sleepiness , snoring, fragmented sleep, Insomnia due to pain.   How likely are you to doze in the following situations: 0 = not likely, 1 = slight chance, 2 = moderate chance, 3 = high chance   Sitting and Reading? Watching Television? Sitting inactive in a public place (theater or meeting)? As  a passenger in a car for an hour without a break? Lying down in the afternoon when circumstances permit? Sitting and talking to someone? Sitting quietly after lunch without alcohol? In a car, while stopped for a few minutes in traffic?     Social History   Socioeconomic History  . Marital status: Divorced    Spouse name: Not on file  . Number of children: 2  . Years of education: Not on file  . Highest education level: Not on file  Occupational History  . 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 Financial resource strain: Not on file  . Food insecurity    Worry: Not on file    Inability: Not on file  . Transportation needs    Medical: Not on file    Non-medical: Not on file  Tobacco Use  . Smoking status: Current Every Day Smoker    Packs/day: 2.00    Years: 36.00    Pack years: 72.00    Types: Cigarettes  . Smokeless tobacco: Never Used  . Tobacco comment: 1 PPD 02/2018  Substance and Sexual Activity  . Alcohol use: Yes    Alcohol/week: 0.0 standard drinks    Comment: occasionally   . Drug use: No  . Sexual activity: Yes    Partners: Male  Lifestyle  . Physical activity    Days per week: Not on file    Minutes per session: Not on file  . Stress: Not on file  Relationships  . Social Herbalist on phone: Not on file    Gets together: Not on file    Attends religious service: Not on file    Active member of club or organization: Not on file    Attends meetings of clubs or organizations: Not on file    Relationship status: Not on file  Other Topics Concern  . Not on file  Social History Narrative   Moving to [place] to care for brother w/ brain tumor   Lives w/ 2 daughters & 4 grandsons    Family History  Problem Relation Age of Onset  . Heart attack Father   . Colon cancer Paternal Grandmother   . Brain cancer Brother 50    Past Medical History:  Diagnosis Date  . Anxiety   . DJD (degenerative joint disease)   .  H/O cardiovascular stress test 2013  . H/O echocardiogram 2012   for cad, htn & obesity  . History of cardiac catheterization    a. normal cors by cath in 2011  . Hypertension   . Obesity   . Spinal stenosis   . Thrombocytopenia ([facility])     Past Surgical History:  Procedure Laterality Date  . BACK SURGERY     x 4  .  BREAST REDUCTION SURGERY    . CARDIAC CATHETERIZATION  2011   5 frech sheath  . COLONOSCOPY N/A 10/14/2012   [id number] rectum and colon  . CYST EXCISION Right 08/08/2016   Procedure: EXCISION OF RIGHT INGUINAL SUBCUTANEOUS MASS (LIKELY SEBACEIYS CYST);  Surgeon: Vickie Epley, MD;  Location: [facility];  Service: General;  Laterality: Right;  7cm x 4cm mass; dermabond dressing  . FLEXIBLE SIGMOIDOSCOPY N/A 10/15/2013   Procedure: FLEXIBLE SIGMOIDOSCOPY;  Surgeon: Daneil Dolin, MD;  Location: [facility];  Service: Endoscopy;  Laterality: N/A;  8:30  . h/o cardiac cath    . LAPAROSCOPIC GASTRIC SLEEVE RESECTION  06/2012   Dr Mickie Bail [facility])     Current Outpatient Medications on File Prior to Visit  Medication Sig Dispense Refill  . acetaminophen (TYLENOL) 500 MG tablet Take 1,000 mg by mouth 2 (two) times daily as needed for moderate pain or headache.     . B COMPLEX VITAMINS SL Place 1 Dose under the tongue daily. Liquid    . Calcium Carb-Cholecalciferol (CALCIUM/VITAMIN D PO) Take 1 tablet by mouth daily.    . cetirizine (ZYRTEC) 10 MG tablet Take 10 mg by mouth as needed for allergies.    . clonazePAM (KLONOPIN) 1 MG tablet Take 1 mg by mouth 2 (two) times daily.     . cyclobenzaprine (FLEXERIL) 5 MG tablet Take 5 mg by mouth 3 (three) times daily as needed for muscle spasms. Only as needed.    Marland Kitchen ECHINACEA PO Take 1 capsule by mouth daily.    Marland Kitchen FIBER PO Take 4 tablets by mouth daily.    . Flaxseed, Linseed, (FLAX SEED OIL PO) Take 1 tablet by mouth daily.     . fluticasone (FLONASE) 50 MCG/ACT nasal spray Place 2 sprays into both nostrils daily as needed for  congestion.  5  . HYDROcodone-acetaminophen (NORCO/VICODIN) 5-325 MG tablet Take by mouth every 6 (six) hours as needed. Last taken in June 2020    . losartan (COZAAR) 25 MG tablet Take 0.5 tablets (12.5 mg total) by mouth daily. (Patient taking differently: Take 25 mg by mouth daily. ) 45 tablet 3  . lubiprostone (AMITIZA) 24 MCG capsule TAKE 1 CAPSULE BY MOUTH TWICE DAILY WITH FOOD 60 capsule 5  . Multiple Vitamin (MULTIVITAMIN WITH MINERALS) TABS tablet Take 1 tablet by mouth daily.    Marland Kitchen omeprazole (PRILOSEC) 20 MG capsule Take 20 mg by mouth daily.     . pravastatin (PRAVACHOL) 40 MG tablet Take 40 mg by mouth daily.    Marland Kitchen senna (SENOKOT) 8.6 MG tablet Take 3 tablets by mouth daily. Alternates with dulcolax    . sertraline (ZOLOFT) 100 MG tablet Take 50 mg by mouth daily.     Marland Kitchen zinc gluconate 50 MG tablet Take 50 mg by mouth daily.     No current facility-administered medications on file prior to visit.     Allergies  Allergen Reactions  . Aspirin Nausea And Vomiting    Due to gastric sleeve surgery   . Codeine Nausea And Vomiting  . Nsaids Anxiety    Due to gastric sleeve surgery  N/V    Physical exam:  Today's Vitals   05/26/19 0930  BP: 121/77  Pulse: 72  Temp: 98.3 F (36.8 C)  Weight: 205 lb (93 kg)  Height: 6' (1.829 m)   Body mass index is 27.8 kg/m.   Wt Readings from Last 3 Encounters:  05/26/19 205 lb (93 kg)  03/27/19 199  lb (90.3 kg)  02/21/18 202 lb (91.6 kg)     Ht Readings from Last 3 Encounters:  05/26/19 6' (1.829 m)  03/27/19 6' (1.829 m)  01/02/19 6' (1.829 m)      General: The patient is awake, alert and appears not in acute distress. The patient is well groomed. Head: Normocephalic, atraumatic. Neck is supple. Mallampati 3,  neck circumference:15.5  inches . Nasal airflow congested .   Retrognathia is mild .  Cardiovascular:  Regular rate and cardiac rhythm by pulse,  without distended neck veins. Respiratory: Lungs are clear to  auscultation.  Skin:  Without evidence of ankle edema, or rash. Trunk: The patient's posture is erect.   Neurologic exam : The patient is awake and alert, oriented to place and time.   Memory subjective described as intact.  Attention span & concentration ability appears normal.  Speech is fluent,  without  dysarthria, dysphonia or aphasia.  Mood and affect are appropriate.   Cranial nerves: no loss of smell or taste reported  Pupils are equal and briskly reactive to light. Funduscopic exam deferred.   Extraocular movements in vertical and horizontal planes were intact and without nystagmus. No Diplopia.Visual fields by finger perimetry are intact. Hearing was intact to soft voice and finger rubbing. Facial sensation intact to fine touch. Facial motor strength is symmetric and tongue and uvula move midline.  Neck ROM : rotation, tilt and flexion extension were normal for age and shoulder shrug was symmetrical.    Motor exam:  loss of left sided grip strength, atrophy.  He drops objects with left and right.  Thenar eminence and snuffbox are showing atrophy.    Sensory:  Fine touch, pinprick and vibration were absent in the ring-finger and pinky , the right hands ;ateral 3 fingers are numb on the left hand.  Proprioception tested in the upper extremities was normal.   Coordination: Rapid alternating movements in the fingers/hands were of decreased speed.  The Finger-to-nose maneuver was slowed .ataxia, dysmetria or tremor.   Gait and station: walked with a cane as assistive device.  Stance is of normal width/ base and the patient turned with 4 steps.  Toe and heel walk were deferred.  Deep tendon reflexes: in the upper and lower extremities are symmetric and intact.  Babinski response was deferred.       After spending a total time of 45 minutes face to face and additional time for physical and neurologic examination, review of laboratory studies,  personal review of imaging  studies, reports and results of other testing and review of referral information / records as far as provided in visit, I have established the following assessments:  1) Myelomalacia/  With sphincter numbness, incontinence to stool, but not to urine. 2) occipital neuralgia. - for over three years treated as such at [facility]. I will set him up for injections at [facility] - but I am also encouraging to stay on as a [facility] patient. I will not prescribe any opioids.  3) insomnia is not a main issue.   I would like to inform  Redmond School, MD [address] [place],  [place] [address] , that I will not be the spine and pain neurologist to take care of this pleasant patient.  He would be much better served by a spinal specialist ( Dr Jannifer [name], Dr Felecia Shelling). I am specialized in Sleep Medicine-  And the patient has been seen by a general Neurologist within [facility] system.    Electronically signed  by: Larey Seat, MD 05/26/2019 9:47 AM  [facility] Board certified by The [employer] of Sleep Medicine and Diplomate of the [employer] of Sleep Medicine. Board certified In Neurology through the [facility], Fellow of the [employer] of Neurology. Medical Director of [facility].

## 2019-05-26 NOTE — Patient Instructions (Signed)
Occipital Neuralgia  Occipital neuralgia is a type of headache that causes brief episodes of very bad pain in the back of your head. Pain from occipital neuralgia may spread (radiate) to other parts of your head. These headaches may be caused by irritation of the nerves that leave your spinal cord high up in your neck, just below the base of your skull (occipital nerves). Your occipital nerves transmit sensations from the back of your head, the top of your head, and the areas behind your ears. What are the causes? This condition can occur without any known cause (primary headache syndrome). In other cases, this condition is caused by pressure on or irritation of one of the two occipital nerves. Pressure and irritation may be due to:  Muscle spasm in the neck.  Neck injury.  Wear and tear of the vertebrae in the neck (osteoarthritis).  Disease of the disks that separate the vertebrae.  Swollen blood vessels that put pressure on the occipital nerves.  Infections.  Tumors.  Diabetes. What are the signs or symptoms? This condition causes brief burning, stabbing, electric, shocking, or shooting pain which can radiate to the top of the head. It can happen on one side or both sides of the head. It can also cause:  Pain behind the eye.  Pain triggered by neck movement or hair brushing.  Scalp tenderness.  Aching in the back of the head between episodes of very bad pain.  Pain gets worse with exposure to bright lights. How is this diagnosed? There is no test that diagnoses this condition. Your health care provider may diagnose this condition based on a physical exam and your symptoms. Other tests may be done, such as:  Imaging studies of the brain and neck (cervical spine), such as an MRI or CT scan. These look for causes of pinched nerves.  Applying pressure to the nerves in the neck to try to re-create the pain.  Injection of numbing medicine into the occipital nerve areas to see if  pain goes away (diagnostic nerve block). How is this treated? Treatment for this condition may begin with simple measures, such as:  Rest.  Massage.  Applying heat or cold on the area.  Over-the-counter pain relievers. If these measures do not work, you may need other treatments, including:  Medicines, such as: ? Prescription-strength anti-inflammatory medicines. ? Muscle relaxants. ? Anti-seizure medicines, which can relieve pain. ? Antidepressants, which can relieve pain. ? Injected medicines, such as medicines that numb the area (local anesthetic) and steroids.  Pulsed radiofrequency ablation. This is when wires are implanted to deliver electrical impulses that block pain signals from the occipital nerve.  Surgery to relieve nerve pressure.  Physical therapy. Follow these instructions at home: Pain management      Avoid any activities that cause pain.  Rest when you have an attack of pain.  Try gentle massage to relieve pain.  Try a different pillow or sleeping position.  If directed, apply heat to the affected area as told by your health care provider. Use the heat source that your health care provider recommends, such as a moist heat pack or a heating pad. ? Place a towel between your skin and the heat source. ? Leave the heat on for 20-30 minutes. ? Remove the heat if your skin turns bright red. This is especially important if you are unable to feel pain, heat, or cold. You may have a greater risk of getting burned.  If directed, apply ice to the   back of the head and neck area as told by your health care provider. ? Put ice in a plastic bag. ? Place a towel between your skin and the bag. ? Leave the ice on for 20 minutes, 2-3 times per day. General instructions  Take over-the-counter and prescription medicines only as told by your health care provider.  Avoid things that make your symptoms worse, such as bright lights.  Try to stay active. Get regular  exercise that does not cause pain. Ask your health care provider to suggest safe exercises for you.  Work with a physical therapist to learn stretching exercises you can do at home.  Practice good posture.  Keep all follow-up visits as told by your health care provider. This is important. Contact a health care provider if:  Your medicine is not working.  You have new or worsening symptoms. Get help right away if:  You have very bad head pain that does not go away.  You have a sudden change in vision, balance, or speech. Summary  Occipital neuralgia is a type of headache that causes brief episodes of very bad pain in the back of your head.  Pain from occipital neuralgia may spread (radiate) to other parts of your head.  Treatment for this condition includes rest, massage, and medicines. This information is not intended to replace advice given to you by your health care provider. Make sure you discuss any questions you have with your health care provider. Document Released: 07/04/2001 Document Revised: 06/26/2017 Document Reviewed: 09/14/2016 Elsevier Patient Education  2020 [employer].

## 2019-06-02 DIAGNOSIS — G8929 Other chronic pain: Secondary | ICD-10-CM | POA: Diagnosis not present

## 2019-06-02 DIAGNOSIS — K5900 Constipation, unspecified: Secondary | ICD-10-CM | POA: Diagnosis not present

## 2019-06-02 DIAGNOSIS — K219 Gastro-esophageal reflux disease without esophagitis: Secondary | ICD-10-CM | POA: Diagnosis not present

## 2019-06-02 DIAGNOSIS — I10 Essential (primary) hypertension: Secondary | ICD-10-CM | POA: Diagnosis not present

## 2019-06-02 DIAGNOSIS — Z79891 Long term (current) use of opiate analgesic: Secondary | ICD-10-CM | POA: Diagnosis not present

## 2019-06-02 DIAGNOSIS — R69 Illness, unspecified: Secondary | ICD-10-CM | POA: Diagnosis not present

## 2019-06-02 DIAGNOSIS — Z7722 Contact with and (suspected) exposure to environmental tobacco smoke (acute) (chronic): Secondary | ICD-10-CM | POA: Diagnosis not present

## 2019-06-02 DIAGNOSIS — M1990 Unspecified osteoarthritis, unspecified site: Secondary | ICD-10-CM | POA: Diagnosis not present

## 2019-06-02 DIAGNOSIS — E785 Hyperlipidemia, unspecified: Secondary | ICD-10-CM | POA: Diagnosis not present

## 2019-06-02 DIAGNOSIS — G43909 Migraine, unspecified, not intractable, without status migrainosus: Secondary | ICD-10-CM | POA: Diagnosis not present

## 2019-06-05 ENCOUNTER — Ambulatory Visit (INDEPENDENT_AMBULATORY_CARE_PROVIDER_SITE_OTHER): Payer: Medicare HMO | Admitting: Neurology

## 2019-06-05 ENCOUNTER — Encounter: Payer: Self-pay | Admitting: Neurology

## 2019-06-05 ENCOUNTER — Other Ambulatory Visit: Payer: Self-pay

## 2019-06-05 VITALS — BP 118/84 | HR 78 | Temp 97.8°F

## 2019-06-05 DIAGNOSIS — M5481 Occipital neuralgia: Secondary | ICD-10-CM | POA: Diagnosis not present

## 2019-06-05 MED ORDER — CYCLOBENZAPRINE HCL 5 MG PO TABS: 5.0000 mg | ORAL_TABLET | Freq: Three times a day (TID) | ORAL | 1 refills | Status: DC | PRN

## 2019-06-05 NOTE — Patient Instructions (Signed)
Should these injection into 3 muscles and  3trigger points and into the C 2 branches- blocking the pain transmission.    If unsuccessful, will defer to Dr. Jaynee Eagles or Dr Krista Blue for injection of botox   .

## 2019-06-05 NOTE — Progress Notes (Signed)
06-05-2019  Bupivicaine injection/Betamethasone protocol for occipital neuralgia    I injected with each of the 5 prepared hyperdermics  0.5 ml per injection into the skin over the left occipital  and high parietal area, after numbing the skin I injected 10 times into the glabella and C2 nerve distribution.  Greater and lesser occipital nerve- dorsal ramus C 2.  Into the left Trapezius and sternocleidomastoideus.   Bupivicaine 0.5%- 250mg /12ml=  2.5 cc EXP:06/06/19 [id number]  Betamethasone 30mg /67ml= 2.5cc Exp-02/2020 LOT:[id number] \\Carmen  Dohmeier, MD

## 2019-06-12 DIAGNOSIS — M533 Sacrococcygeal disorders, not elsewhere classified: Secondary | ICD-10-CM | POA: Diagnosis not present

## 2019-06-12 DIAGNOSIS — M5412 Radiculopathy, cervical region: Secondary | ICD-10-CM | POA: Diagnosis not present

## 2019-06-12 DIAGNOSIS — M542 Cervicalgia: Secondary | ICD-10-CM | POA: Diagnosis not present

## 2019-06-12 DIAGNOSIS — G8929 Other chronic pain: Secondary | ICD-10-CM | POA: Diagnosis not present

## 2019-06-12 DIAGNOSIS — M545 Low back pain: Secondary | ICD-10-CM | POA: Diagnosis not present

## 2019-06-12 DIAGNOSIS — Z5181 Encounter for therapeutic drug level monitoring: Secondary | ICD-10-CM | POA: Diagnosis not present

## 2019-06-12 DIAGNOSIS — M549 Dorsalgia, unspecified: Secondary | ICD-10-CM | POA: Diagnosis not present

## 2019-06-12 DIAGNOSIS — G894 Chronic pain syndrome: Secondary | ICD-10-CM | POA: Diagnosis not present

## 2019-06-12 DIAGNOSIS — M5417 Radiculopathy, lumbosacral region: Secondary | ICD-10-CM | POA: Diagnosis not present

## 2019-06-12 DIAGNOSIS — D696 Thrombocytopenia, unspecified: Secondary | ICD-10-CM | POA: Diagnosis not present

## 2019-06-23 DIAGNOSIS — M48062 Spinal stenosis, lumbar region with neurogenic claudication: Secondary | ICD-10-CM | POA: Diagnosis not present

## 2019-06-23 DIAGNOSIS — I10 Essential (primary) hypertension: Secondary | ICD-10-CM | POA: Diagnosis not present

## 2019-06-23 DIAGNOSIS — R69 Illness, unspecified: Secondary | ICD-10-CM | POA: Diagnosis not present

## 2019-06-23 DIAGNOSIS — E7849 Other hyperlipidemia: Secondary | ICD-10-CM | POA: Diagnosis not present

## 2019-07-11 ENCOUNTER — Other Ambulatory Visit: Payer: Self-pay | Admitting: Gastroenterology

## 2019-07-28 DIAGNOSIS — M5412 Radiculopathy, cervical region: Secondary | ICD-10-CM | POA: Diagnosis not present

## 2019-07-28 DIAGNOSIS — Z5181 Encounter for therapeutic drug level monitoring: Secondary | ICD-10-CM | POA: Diagnosis not present

## 2019-08-14 DIAGNOSIS — G959 Disease of spinal cord, unspecified: Secondary | ICD-10-CM | POA: Diagnosis not present

## 2019-08-14 DIAGNOSIS — M5416 Radiculopathy, lumbar region: Secondary | ICD-10-CM | POA: Diagnosis not present

## 2019-08-14 DIAGNOSIS — M5412 Radiculopathy, cervical region: Secondary | ICD-10-CM | POA: Diagnosis not present

## 2019-08-14 DIAGNOSIS — R432 Parageusia: Secondary | ICD-10-CM | POA: Diagnosis not present

## 2019-08-15 DIAGNOSIS — J349 Unspecified disorder of nose and nasal sinuses: Secondary | ICD-10-CM | POA: Diagnosis not present

## 2019-08-15 DIAGNOSIS — Z6827 Body mass index (BMI) 27.0-27.9, adult: Secondary | ICD-10-CM | POA: Diagnosis not present

## 2019-08-15 DIAGNOSIS — R432 Parageusia: Secondary | ICD-10-CM | POA: Diagnosis not present

## 2019-08-15 DIAGNOSIS — E663 Overweight: Secondary | ICD-10-CM | POA: Diagnosis not present

## 2019-08-24 DIAGNOSIS — M48062 Spinal stenosis, lumbar region with neurogenic claudication: Secondary | ICD-10-CM | POA: Diagnosis not present

## 2019-08-24 DIAGNOSIS — E7849 Other hyperlipidemia: Secondary | ICD-10-CM | POA: Diagnosis not present

## 2019-08-24 DIAGNOSIS — I10 Essential (primary) hypertension: Secondary | ICD-10-CM | POA: Diagnosis not present

## 2019-09-02 DIAGNOSIS — M542 Cervicalgia: Secondary | ICD-10-CM | POA: Diagnosis not present

## 2019-09-02 DIAGNOSIS — D696 Thrombocytopenia, unspecified: Secondary | ICD-10-CM | POA: Diagnosis not present

## 2019-09-02 DIAGNOSIS — M545 Low back pain: Secondary | ICD-10-CM | POA: Diagnosis not present

## 2019-09-02 DIAGNOSIS — M5417 Radiculopathy, lumbosacral region: Secondary | ICD-10-CM | POA: Diagnosis not present

## 2019-09-02 DIAGNOSIS — Z5181 Encounter for therapeutic drug level monitoring: Secondary | ICD-10-CM | POA: Diagnosis not present

## 2019-09-02 DIAGNOSIS — M549 Dorsalgia, unspecified: Secondary | ICD-10-CM | POA: Diagnosis not present

## 2019-09-02 DIAGNOSIS — M533 Sacrococcygeal disorders, not elsewhere classified: Secondary | ICD-10-CM | POA: Diagnosis not present

## 2019-09-02 DIAGNOSIS — G8929 Other chronic pain: Secondary | ICD-10-CM | POA: Diagnosis not present

## 2019-09-02 DIAGNOSIS — M5412 Radiculopathy, cervical region: Secondary | ICD-10-CM | POA: Diagnosis not present

## 2019-09-02 DIAGNOSIS — G894 Chronic pain syndrome: Secondary | ICD-10-CM | POA: Diagnosis not present

## 2019-09-09 DIAGNOSIS — M533 Sacrococcygeal disorders, not elsewhere classified: Secondary | ICD-10-CM | POA: Diagnosis not present

## 2019-09-09 DIAGNOSIS — D696 Thrombocytopenia, unspecified: Secondary | ICD-10-CM | POA: Diagnosis not present

## 2019-09-17 DIAGNOSIS — J343 Hypertrophy of nasal turbinates: Secondary | ICD-10-CM | POA: Diagnosis not present

## 2019-09-17 DIAGNOSIS — J342 Deviated nasal septum: Secondary | ICD-10-CM | POA: Diagnosis not present

## 2019-09-17 DIAGNOSIS — R438 Other disturbances of smell and taste: Secondary | ICD-10-CM | POA: Diagnosis not present

## 2019-09-17 DIAGNOSIS — H6123 Impacted cerumen, bilateral: Secondary | ICD-10-CM | POA: Diagnosis not present

## 2019-10-15 DIAGNOSIS — Z87898 Personal history of other specified conditions: Secondary | ICD-10-CM | POA: Diagnosis not present

## 2019-10-15 DIAGNOSIS — M5416 Radiculopathy, lumbar region: Secondary | ICD-10-CM | POA: Diagnosis not present

## 2019-10-15 DIAGNOSIS — R531 Weakness: Secondary | ICD-10-CM | POA: Diagnosis not present

## 2019-10-15 DIAGNOSIS — M5412 Radiculopathy, cervical region: Secondary | ICD-10-CM | POA: Diagnosis not present

## 2019-10-15 DIAGNOSIS — R2681 Unsteadiness on feet: Secondary | ICD-10-CM | POA: Diagnosis not present

## 2019-10-15 DIAGNOSIS — R159 Full incontinence of feces: Secondary | ICD-10-CM | POA: Diagnosis not present

## 2019-10-15 DIAGNOSIS — R432 Parageusia: Secondary | ICD-10-CM | POA: Diagnosis not present

## 2019-10-15 DIAGNOSIS — G959 Disease of spinal cord, unspecified: Secondary | ICD-10-CM | POA: Diagnosis not present

## 2019-10-15 DIAGNOSIS — R519 Headache, unspecified: Secondary | ICD-10-CM | POA: Diagnosis not present

## 2019-10-22 DIAGNOSIS — I10 Essential (primary) hypertension: Secondary | ICD-10-CM | POA: Diagnosis not present

## 2019-10-22 DIAGNOSIS — E7849 Other hyperlipidemia: Secondary | ICD-10-CM | POA: Diagnosis not present

## 2019-10-22 DIAGNOSIS — M48062 Spinal stenosis, lumbar region with neurogenic claudication: Secondary | ICD-10-CM | POA: Diagnosis not present

## 2019-10-23 DIAGNOSIS — D696 Thrombocytopenia, unspecified: Secondary | ICD-10-CM | POA: Diagnosis not present

## 2019-10-23 DIAGNOSIS — M47812 Spondylosis without myelopathy or radiculopathy, cervical region: Secondary | ICD-10-CM | POA: Diagnosis not present

## 2019-11-13 DIAGNOSIS — E7849 Other hyperlipidemia: Secondary | ICD-10-CM | POA: Diagnosis not present

## 2019-11-13 DIAGNOSIS — I10 Essential (primary) hypertension: Secondary | ICD-10-CM | POA: Diagnosis not present

## 2019-11-13 DIAGNOSIS — N529 Male erectile dysfunction, unspecified: Secondary | ICD-10-CM | POA: Diagnosis not present

## 2019-11-13 DIAGNOSIS — G8929 Other chronic pain: Secondary | ICD-10-CM | POA: Diagnosis not present

## 2019-11-13 DIAGNOSIS — R69 Illness, unspecified: Secondary | ICD-10-CM | POA: Diagnosis not present

## 2019-11-13 DIAGNOSIS — G9589 Other specified diseases of spinal cord: Secondary | ICD-10-CM | POA: Diagnosis not present

## 2019-11-13 DIAGNOSIS — G4733 Obstructive sleep apnea (adult) (pediatric): Secondary | ICD-10-CM | POA: Diagnosis not present

## 2019-11-13 DIAGNOSIS — M48062 Spinal stenosis, lumbar region with neurogenic claudication: Secondary | ICD-10-CM | POA: Diagnosis not present

## 2019-11-14 DIAGNOSIS — R159 Full incontinence of feces: Secondary | ICD-10-CM | POA: Diagnosis not present

## 2019-11-14 DIAGNOSIS — R519 Headache, unspecified: Secondary | ICD-10-CM | POA: Diagnosis not present

## 2019-11-14 DIAGNOSIS — R2681 Unsteadiness on feet: Secondary | ICD-10-CM | POA: Diagnosis not present

## 2019-11-14 DIAGNOSIS — R531 Weakness: Secondary | ICD-10-CM | POA: Diagnosis not present

## 2019-11-14 DIAGNOSIS — Z87898 Personal history of other specified conditions: Secondary | ICD-10-CM | POA: Diagnosis not present

## 2019-11-14 DIAGNOSIS — R432 Parageusia: Secondary | ICD-10-CM | POA: Diagnosis not present

## 2019-11-20 DIAGNOSIS — F418 Other specified anxiety disorders: Secondary | ICD-10-CM | POA: Diagnosis not present

## 2019-11-20 DIAGNOSIS — R69 Illness, unspecified: Secondary | ICD-10-CM | POA: Diagnosis not present

## 2019-11-21 DIAGNOSIS — M48062 Spinal stenosis, lumbar region with neurogenic claudication: Secondary | ICD-10-CM | POA: Diagnosis not present

## 2019-11-21 DIAGNOSIS — I10 Essential (primary) hypertension: Secondary | ICD-10-CM | POA: Diagnosis not present

## 2019-11-21 DIAGNOSIS — E7849 Other hyperlipidemia: Secondary | ICD-10-CM | POA: Diagnosis not present

## 2019-11-25 DIAGNOSIS — M5417 Radiculopathy, lumbosacral region: Secondary | ICD-10-CM | POA: Diagnosis not present

## 2019-11-25 DIAGNOSIS — G894 Chronic pain syndrome: Secondary | ICD-10-CM | POA: Diagnosis not present

## 2019-11-25 DIAGNOSIS — D696 Thrombocytopenia, unspecified: Secondary | ICD-10-CM | POA: Diagnosis not present

## 2019-11-25 DIAGNOSIS — M542 Cervicalgia: Secondary | ICD-10-CM | POA: Diagnosis not present

## 2019-11-25 DIAGNOSIS — M47812 Spondylosis without myelopathy or radiculopathy, cervical region: Secondary | ICD-10-CM | POA: Diagnosis not present

## 2019-11-25 DIAGNOSIS — M533 Sacrococcygeal disorders, not elsewhere classified: Secondary | ICD-10-CM | POA: Diagnosis not present

## 2019-11-25 DIAGNOSIS — M5412 Radiculopathy, cervical region: Secondary | ICD-10-CM | POA: Diagnosis not present

## 2019-11-25 DIAGNOSIS — G8929 Other chronic pain: Secondary | ICD-10-CM | POA: Diagnosis not present

## 2019-11-25 DIAGNOSIS — Z5181 Encounter for therapeutic drug level monitoring: Secondary | ICD-10-CM | POA: Diagnosis not present

## 2019-11-25 DIAGNOSIS — M545 Low back pain: Secondary | ICD-10-CM | POA: Diagnosis not present

## 2019-12-01 ENCOUNTER — Telehealth: Payer: Self-pay | Admitting: *Deleted

## 2019-12-01 NOTE — Telephone Encounter (Signed)
Pt consented to a virtual visit. 

## 2019-12-01 NOTE — Telephone Encounter (Signed)
Jerome [name]., you are scheduled for a virtual visit with your provider today.  Just as we do with appointments in the office, we must obtain your consent to participate.  Your consent will be active for this visit and any virtual visit you may have with one of our providers in the next 365 days.  If you have a MyChart account, I can also send a copy of this consent to you electronically.  All virtual visits are billed to your insurance company just like a traditional visit in the office.  As this is a virtual visit, video technology does not allow for your provider to perform a traditional examination.  This may limit your provider's ability to fully assess your condition.  If your provider identifies any concerns that need to be evaluated in person or the need to arrange testing such as labs, EKG, etc, we will make arrangements to do so.  Although advances in technology are sophisticated, we cannot ensure that it will always work on either your end or our end.  If the connection with a video visit is poor, we may have to switch to a telephone visit.  With either a video or telephone visit, we are not always able to ensure that we have a secure connection.   I need to obtain your verbal consent now.   Are you willing to proceed with your visit today?

## 2019-12-02 ENCOUNTER — Encounter: Payer: Self-pay | Admitting: Internal Medicine

## 2019-12-02 ENCOUNTER — Telehealth (INDEPENDENT_AMBULATORY_CARE_PROVIDER_SITE_OTHER): Payer: Medicare HMO | Admitting: Gastroenterology

## 2019-12-02 ENCOUNTER — Encounter: Payer: Self-pay | Admitting: Gastroenterology

## 2019-12-02 ENCOUNTER — Other Ambulatory Visit: Payer: Self-pay

## 2019-12-02 DIAGNOSIS — K5900 Constipation, unspecified: Secondary | ICD-10-CM

## 2019-12-02 NOTE — Patient Instructions (Signed)
1. We will try and sample you Amitiza until you are able to obtain Amitiza from [place] and/or if you are approved for patient assistance. If we don't have samples, we can always try a different medication like Linzess or Trulance (samples if not covered-while status for Amitiza patient assistance is pending).  2. We will see you back in the office in six months for follow up. Call sooner if you have any changes in bowels, blood in stool.

## 2019-12-02 NOTE — Progress Notes (Signed)
Primary Care Physician:  Redmond School, MD Primary GI:  Garfield Cornea, MD    Patient Location: Home  Provider Location: [facility] office  Reason for Visit:  Chief Complaint  Patient presents with  . Constipation    f/u. Wants to discuss with LSL about medication. Amitiza not covered by insurance     Persons present on the virtual encounter, with roles: Patient, myself (provider),Mindy Esdtudillo CMA (updated meds and allergies)  Total time (minutes) spent on medical discussion: 25 minutes  Due to COVID-19, visit was conducted using Mychart video method.  Visit was requested by patient.  Virtual Visit via Mychart Video  I connected with Jerome [name]. on 12/02/19 at 10:00 AM EDT by Mychart video and verified that I am speaking with the correct person using two identifiers.   I discussed the limitations, risks, security and privacy concerns of performing an evaluation and management service by telephone/video and the availability of in person appointments. I also discussed with the patient that there may be a patient responsible charge related to this service. The patient expressed understanding and agreed to proceed.   HPI:   Jerome [name]. is a 59 y.o. male who presents for virtual visit regarding follow-up.  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  Last seen in May 2019.  He is due for average risk screening colonoscopy in 2024.  Currently taking bisacodyl 15mg  at bedtime along with docusate sodium 300mg  at bedtime. Using Amitiza 93mcg daily when he has the medication. No longer covered by his insurance as of 06/2019. Can't affort $340 per month. He can get cheaper in [place] and has a planned trip in 12/2019. Usually costs $40 for 60 pills. If he does not have good BM in the morning, he has to wear depends when he goes out due to risk of fecal incontinence from her spinal stenosis. Notes that he is not able to  "push" out a BM, stools have to be liquid texture. If he sits too long for BM, he feels like his rectum comes out. Uses hydrocortisone cream when needed.  When he has Amitiza and one cup of coffee, then he will have good BM and do fine during day. Yesterday he purchase 10 Amitiza with GoodRx coupon for $68 after having fecal incontinence at a family event. No melena, brbpr. No abd pain, heartburn, n/v. Since unable to afford Amitiza, he went from BID to once daily dosing as available. Tried double dose miralax but doesn't help. Creates increased bloating as well.  Come way down on pain medication. Doing meditation to cope with pain. Hydrocodone 5mg  when needed. June 2020 to May 2021 used only 30 pills. Just dealing with back/neck pain.   PCP's pharmacist trying to get [facility] patient assistance.   Current Outpatient Medications  Medication Sig Dispense Refill  . acetaminophen (TYLENOL) 500 MG tablet Take 1,000 mg by mouth 2 (two) times daily as needed for moderate pain or headache.     . B COMPLEX VITAMINS SL Place 1 Dose under the tongue daily. Liquid    . bisacodyl (DULCOLAX) 5 MG EC tablet Take 15 mg by mouth at bedtime.    . Calcium Carb-Cholecalciferol (CALCIUM/VITAMIN D PO) Take 1 tablet by mouth daily.    . cetirizine (ZYRTEC) 10 MG tablet Take 10 mg by mouth as needed for allergies.    . clonazePAM (KLONOPIN) 1 MG tablet Take 1 mg by  mouth 2 (two) times daily.     . cyclobenzaprine (FLEXERIL) 5 MG tablet Take 1 tablet (5 mg total) by mouth 3 (three) times daily as needed for muscle spasms. Only as needed. 30 tablet 1  . docusate sodium (COLACE) 100 MG capsule Take 300 mg by mouth at bedtime.    Marland Kitchen ECHINACEA PO Take 1 capsule by mouth daily. seasonally    . Flaxseed, Linseed, (FLAX SEED OIL PO) Take 1 tablet by mouth daily.     . fluticasone (FLONASE) 50 MCG/ACT nasal spray Place 2 sprays into both nostrils daily as needed for congestion.  5  . losartan (COZAAR) 25 MG tablet Take 0.5 tablets  (12.5 mg total) by mouth daily. 45 tablet 3  . lubiprostone (AMITIZA) 24 MCG capsule TAKE 1 CAPSULE BY MOUTH TWICE DAILY WITH FOOD (Patient taking differently: TAKE 1 CAPSULE BY MOUTH TWICE DAILY WITH FOOD as needed) 60 capsule 5  . Multiple Vitamin (MULTIVITAMIN WITH MINERALS) TABS tablet Take 1 tablet by mouth daily.    Marland Kitchen omeprazole (PRILOSEC) 20 MG capsule Take 20 mg by mouth daily.     . pravastatin (PRAVACHOL) 40 MG tablet Take 40 mg by mouth daily.    . sertraline (ZOLOFT) 100 MG tablet Take 100 mg by mouth daily.     Marland Kitchen zinc gluconate 50 MG tablet Take 50 mg by mouth daily.     No current facility-administered medications for this visit.    Past Medical History:  Diagnosis Date  . Anxiety   . DJD (degenerative joint disease)   . H/O cardiovascular stress test 2013  . H/O echocardiogram 2012   for cad, htn & obesity  . History of cardiac catheterization    a. normal cors by cath in 2011  . Hypertension   . Obesity   . Spinal stenosis   . Thrombocytopenia ([place])     Past Surgical History:  Procedure Laterality Date  . BACK SURGERY     x 4  . BREAST REDUCTION SURGERY    . CARDIAC CATHETERIZATION  2011   5 frech sheath  . COLONOSCOPY N/A 10/14/2012   [id number] rectum and colon  . CYST EXCISION Right 08/08/2016   Procedure: EXCISION OF RIGHT INGUINAL SUBCUTANEOUS MASS (LIKELY SEBACEIYS CYST);  Surgeon: Vickie Epley, MD;  Location: [facility] ORS;  Service: General;  Laterality: Right;  7cm x 4cm mass; dermabond dressing  . FLEXIBLE SIGMOIDOSCOPY N/A 10/15/2013   Procedure: FLEXIBLE SIGMOIDOSCOPY;  Surgeon: Daneil Dolin, MD;  Location: [facility] END[facility] SUITE;  Service: Endoscopy;  Laterality: N/A;  8:30  . h/o cardiac cath    . LAPAROSCOPIC GASTRIC SLEEVE RESECTION  06/2012   Dr Mickie Bail [facility])    Family History  Problem Relation Age of Onset  . Heart attack Father   . Colon cancer Paternal Grandmother   . Brain cancer Brother 62    Social History   Socioeconomic History  .  Marital status: Divorced    Spouse name: Not on file  . Number of children: 2  . Years of education: Not on file  . Highest education level: Not on file  Occupational History  . 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 Smoking status: Former Smoker    Packs/day: 2.00    Years: 36.00    Pack years: 72.00    Types: Cigarettes  . Smokeless tobacco: Never Used  Substance and Sexual Activity  . Alcohol use: Yes    Alcohol/week: 0.0 standard drinks  Comment: occasionally   . Drug use: No  . Sexual activity: Yes    Partners: Male  Other Topics Concern  . Not on file  Social History Narrative   Moving to [place] to care for brother w/ brain tumor   Lives w/ 2 daughters & 4 grandsons   Social Determinants of Health   Financial Resource Strain:   . Difficulty of Paying Living Expenses:   Food Insecurity:   . Worried About Charity fundraiser in the Last Year:   . Arboriculturist in the Last Year:   Transportation Needs:   . Film/video editor (Medical):   Marland Kitchen Lack of Transportation (Non-Medical):   Physical Activity:   . Days of Exercise per Week:   . Minutes of Exercise per Session:   Stress:   . Feeling of Stress :   Social Connections:   . Frequency of Communication with Friends and Family:   . Frequency of Social Gatherings with Friends and Family:   . Attends Religious Services:   . Active Member of Clubs or Organizations:   . Attends Archivist Meetings:   Marland Kitchen Marital Status:   Intimate Partner Violence:   . Fear of Current or Ex-Partner:   . Emotionally Abused:   Marland Kitchen Physically Abused:   . Sexually Abused:       ROS:  General: Negative for anorexia, weight loss, fever, chills, fatigue, weakness. Eyes: Negative for vision changes.  ENT: Negative for hoarseness, difficulty swallowing , nasal congestion. CV: Negative for chest pain, angina, palpitations, dyspnea on exertion, peripheral edema.  Respiratory: Negative for dyspnea  at rest, dyspnea on exertion, cough, sputum, wheezing.  GI: See history of present illness. GU:  Negative for dysuria, hematuria, urinary incontinence, urinary frequency, nocturnal urination.  MS: Negative for joint pain, chronic neck and low back pain.  Derm: Negative for rash or itching.  Neuro: Negative for weakness,  seizure, frequent headaches, memory loss, confusion. Decreased sensation of sensation in all extremities as well as perianal area, worse on left side.  Psych: Negative for anxiety, depression, suicidal ideation, hallucinations.  Endo: Negative for unusual weight change.  Heme: Negative for bruising or bleeding. Allergy: Negative for rash or hives.   Observations/Objective: Pleasant well nourished, cooperative, nad.     Assessment and Plan: Pleasant 59 y/o male with constipation in part due to drug-induced (although has cut way back) and severe spinal stenosis. He is unable to push stool out and relies on medication to keep stools liquid to allow better evacuation. If he does not have good BM in the morning, he will often have fecal incontinence and soiling during the day. Was doing well on Amitiza 81mcg BID but as of 06/2019, his insurance will no longer pay for it and report to available alternative. He has had to reduce his dose to daily as available and for the most part since 08/2019 has been taking bisacodyl 15mg  and docusate sodium 300mg  at bedtime. He is currently in process of filling out patient assistance with help of pharmacist at [facility]. We will try to assist with samples as available until this can be sorted out. He also plans to obtain supply while in [place] next month. He is up to date on colonoscopy at this time, next one due in 2024 unless bowel habit change, blood in stool etc.   Return to the office in six months or sooner if needed.   Follow Up Instructions:    I discussed  the assessment and treatment plan with the patient. The patient  was provided an opportunity to ask questions and all were answered. The patient agreed with the plan and demonstrated an understanding of the instructions. AVS mailed to patient's home address.   The patient was advised to call back or seek an in-person evaluation if the symptoms worsen or if the condition fails to improve as anticipated.  I provided 25 minutes of virtual face-to-face time during this encounter.   Neil Crouch, PA-C

## 2019-12-04 ENCOUNTER — Encounter: Payer: Self-pay | Admitting: Adult Health

## 2019-12-04 ENCOUNTER — Ambulatory Visit: Payer: Medicare HMO | Admitting: Adult Health

## 2019-12-04 ENCOUNTER — Other Ambulatory Visit: Payer: Self-pay

## 2019-12-04 VITALS — BP 129/87 | HR 74 | Temp 97.0°F | Ht 72.0 in | Wt 200.0 lb

## 2019-12-04 DIAGNOSIS — G479 Sleep disorder, unspecified: Secondary | ICD-10-CM | POA: Diagnosis not present

## 2019-12-04 DIAGNOSIS — Z8669 Personal history of other diseases of the nervous system and sense organs: Secondary | ICD-10-CM

## 2019-12-04 NOTE — Progress Notes (Signed)
PATIENT: Jerome [name]. DOB: 1961/03/31  REASON FOR VISIT: follow up HISTORY FROM: patient  HISTORY OF PRESENT ILLNESS: Today 12/04/19:  Jerome [name] is a 59 year old male with a history of obstructive sleep apnea.  He returns today for follow-up.  He states that he had a sleep study in 2017 that revealed he no longer has sleep apnea.  He reports that he is having the same symptoms he reported when he was diagnosed with sleep apnea.  Reports that he is tired throughout the day.  Reports that he laid down 1 day to take a nap and woke up several hours later.  Reports he also wakes up with a dry and sore throat.  Reports that there are nights that he has woken himself up making weird noises.  He is managed by [facility] for chronic back pain.  Reports that he is not on daily opioids.  Reports that he has hydrocodone but has only taken 1 pill since March 2021 he returns today for an evaluation.  HISTORY  (Copied from Jerome [name]'s note) The patient presents for with reports of nocturnal palpitations , dry mouth.  He is on rare opioids for treatment. He is still concerned of central apnea.  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  Dr Oneida Alar, vascular surgeon also follows him.   Notes by Dr Bronson Ing- He had a normal coronary angiogram on March 11, 2010.  He has a history of venous varicosities, hypertension, obstructive sleep apnea, and hyperlipidemia. ECG performed in the office today which I ordered and personally interpreted demonstrates normal sinus rhythm with no ischemic ST segment or T-wave abnormalities, nor any arrhythmias. He said about a month ago, he had a sensation of "bubbles floating upward in his chest ".  He said occurred while lying down and trying to meditate.  He is due for an injection in the C7-T1 region.  He had more those sensations about a month ago while cleaning his kitchen floor.  He returned from [place]  yesterday where he is trying to sell a piece of property.  He did not experience any chest pain, shortness of breath, or palpitations for the past 1 month.  He was diagnosed with shingles in September and felt he may be experiencing some neuropathic pain on his chest from this.  He also describes an sensation of dizziness and feeling like he is going to pass out if he closes his eyes while in the shower.  He denies taking hot showers. The patient had the first sleep study in the year 2017 at [facility] and meanwhile has broken his BiPAP machine, issued 2011 . He hasn't used it in over three years.   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  Pets are not present. Tobacco use- quit 2019.  ETOH use: none , Caffeine intake in form of Coffee( 2) Soda( none  ) Tea ( green) or energy drinks.  Sleep habits are as follows:The patient's dinner time is between 5 PM. The patient goes to bed at 10 PM and continues to sleep for 2 hours, wakes for one  bathroom break. The preferred sleep position is supine , with the support of 2 pillows.   6.30 AM is the usual rise time. The patient wakes up spontaneously/ before his  alarm.  He reports not feeling refreshed or restored in AM, with symptoms such as dry mouth and residual fatigue, feeling sore and achy.  Naps are taken in frequently, not scheduled.  REVIEW OF SYSTEMS: Out of a complete 14 system review of symptoms, the patient complains only of the following symptoms, and all other reviewed systems are negative.  ESS 21 [id number]  ALLERGIES: Allergies  Allergen Reactions  . Aspirin Nausea And Vomiting    Due to gastric sleeve surgery   . Codeine Nausea And Vomiting  . Nsaids Anxiety    Due to gastric sleeve surgery  N/V    HOME MEDICATIONS: Outpatient  Medications Prior to Visit  Medication Sig Dispense Refill  . acetaminophen (TYLENOL) 500 MG tablet Take 1,000 mg by mouth 2 (two) times daily as needed for moderate pain or headache.     . B COMPLEX VITAMINS SL Place 1 Dose under the tongue daily. Liquid    . bisacodyl (DULCOLAX) 5 MG EC tablet Take 15 mg by mouth at bedtime.    . Calcium Carb-Cholecalciferol (CALCIUM/VITAMIN D PO) Take 1 tablet by mouth daily.    . cetirizine (ZYRTEC) 10 MG tablet Take 10 mg by mouth as needed for allergies.    . clonazePAM (KLONOPIN) 1 MG tablet Take 1 mg by mouth 2 (two) times daily.     . cyclobenzaprine (FLEXERIL) 5 MG tablet Take 1 tablet (5 mg total) by mouth 3 (three) times daily as needed for muscle spasms. Only as needed. 30 tablet 1  . docusate sodium (COLACE) 100 MG capsule Take 300 mg by mouth at bedtime.    Marland Kitchen ECHINACEA PO Take 1 capsule by mouth daily. seasonally    . Flaxseed, Linseed, (FLAX SEED OIL PO) Take 1 tablet by mouth daily.     . fluticasone (FLONASE) 50 MCG/ACT nasal spray Place 2 sprays into both nostrils daily as needed for congestion.  5  . losartan (COZAAR) 25 MG tablet Take 0.5 tablets (12.5 mg total) by mouth daily. 45 tablet 3  . lubiprostone (AMITIZA) 24 MCG capsule TAKE 1 CAPSULE BY MOUTH TWICE DAILY WITH FOOD (Patient taking differently: TAKE 1 CAPSULE BY MOUTH TWICE DAILY WITH FOOD as needed) 60 capsule 5  . Multiple Vitamin (MULTIVITAMIN WITH MINERALS) TABS tablet Take 1 tablet by mouth daily.    Marland Kitchen omeprazole (PRILOSEC) 20 MG capsule Take 20 mg by mouth daily.     . pravastatin (PRAVACHOL) 40 MG tablet Take 40 mg by mouth daily.    . sertraline (ZOLOFT) 100 MG tablet Take 100 mg by mouth daily.     Marland Kitchen zinc gluconate 50 MG tablet Take 50 mg by mouth daily.     No facility-administered medications prior to visit.    PAST MEDICAL HISTORY: Past Medical History:  Diagnosis Date  . Anxiety   . DJD (degenerative joint disease)   . H/O cardiovascular stress test 2013  . H/O  echocardiogram 2012   for cad, htn & obesity  . History of cardiac catheterization    a. normal cors by cath in 2011  . Hypertension   . Obesity   . Spinal stenosis   . Thrombocytopenia ([place])     PAST SURGICAL HISTORY: Past Surgical History:  Procedure Laterality Date  . BACK SURGERY     x 4  . BREAST REDUCTION SURGERY    . CARDIAC CATHETERIZATION  2011   5 frech sheath  . COLONOSCOPY N/A 10/14/2012   [id number] rectum and colon  .  CYST EXCISION Right 08/08/2016   Procedure: EXCISION OF RIGHT INGUINAL SUBCUTANEOUS MASS (LIKELY SEBACEIYS CYST);  Surgeon: Vickie Epley, MD;  Location: [facility] ORS;  Service: General;  Laterality: Right;  7cm x 4cm mass; dermabond dressing  . FLEXIBLE SIGMOIDOSCOPY N/A 10/15/2013   Procedure: FLEXIBLE SIGMOIDOSCOPY;  Surgeon: Daneil Dolin, MD;  Location: [facility];  Service: Endoscopy;  Laterality: N/A;  8:30  . h/o cardiac cath    . LAPAROSCOPIC GASTRIC SLEEVE RESECTION  06/2012   Dr Mickie Bail [facility])    FAMILY HISTORY: Family History  Problem Relation Age of Onset  . Heart attack Father   . Colon cancer Paternal Grandmother   . Brain cancer Brother 25    SOCIAL HISTORY: Social History   Socioeconomic History  . Marital status: Divorced    Spouse name: Not on file  . Number of children: 2  . Years of education: Not on file  . Highest education level: Not on file  Occupational History  . 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 Smoking status: Former Smoker    Packs/day: 2.00    Years: 36.00    Pack years: 72.00    Types: Cigarettes  . Smokeless tobacco: Never Used  Substance and Sexual Activity  . Alcohol use: Yes    Alcohol/week: 0.0 standard drinks    Comment: occasionally   . Drug use: No  . Sexual activity: Yes    Partners: Male  Other Topics Concern  . Not on file  Social History Narrative   Moving to [place] to care for brother w/ brain tumor   Lives w/ 2 daughters & 4 grandsons   Social  Determinants of Health   Financial Resource Strain:   . Difficulty of Paying Living Expenses:   Food Insecurity:   . Worried About Charity fundraiser in the Last Year:   . Arboriculturist in the Last Year:   Transportation Needs:   . Film/video editor (Medical):   Marland Kitchen Lack of Transportation (Non-Medical):   Physical Activity:   . Days of Exercise per Week:   . Minutes of Exercise per Session:   Stress:   . Feeling of Stress :   Social Connections:   . Frequency of Communication with Friends and Family:   . Frequency of Social Gatherings with Friends and Family:   . Attends Religious Services:   . Active Member of Clubs or Organizations:   . Attends Archivist Meetings:   Marland Kitchen Marital Status:   Intimate Partner Violence:   . Fear of Current or Ex-Partner:   . Emotionally Abused:   Marland Kitchen Physically Abused:   . Sexually Abused:       PHYSICAL EXAM  Vitals:   12/04/19 1415  BP: 129/87  Pulse: 74  Temp: (!) 97 F (36.1 C)  Weight: 200 lb (90.7 kg)  Height: 6' (1.829 m)   Body mass index is 27.12 kg/m.  Generalized: Well developed, in no acute distress   Neurological examination  Mentation: Alert oriented to time, place, history taking. Follows all commands speech and language fluent Cranial nerve II-XII: . Extraocular movements were full, visual field were full on confrontational test. Facial sensation and strength were normal. Uvula tongue midline.  Mallampati 1+  Motor: The motor testing reveals 5 over 5 strength in the right upper and lower extremity.  4-5 strength in the left upper and lower extremity. Sensory: Sensory testing is intact to soft touch  on all 4 extremities. No evidence of extinction is noted.  Gait and station: Patient uses a cane when ambulating.  Patient has a limp on the left.    DIAGNOSTIC DATA (LABS, IMAGING, TESTING) - I reviewed patient records, labs, notes, testing and imaging myself where available.  Lab Results  Component Value  Date   WBC 5.1 08/08/2016   HGB 15.6 08/08/2016   HCT 47.4 08/08/2016   MCV 84.6 08/08/2016   PLT 119 (L) 08/08/2016      Component Value Date/Time   NA 139 01/03/2016 1548   K 4.6 01/03/2016 1548   CL 103 01/03/2016 1548   CO2 28 01/03/2016 1548   GLUCOSE 73 01/03/2016 1548   BUN 17 01/03/2016 1548   CREATININE 0.73 01/03/2016 1548   CALCIUM 9.2 01/03/2016 1548   PROT 7.5 07/27/2013 1128   ALBUMIN 4.4 07/27/2013 1128   AST 20 07/27/2013 1128   ALT 21 07/27/2013 1128   ALKPHOS 87 07/27/2013 1128   BILITOT 0.8 07/27/2013 1128   GFRNONAA >90 07/27/2013 1128   GFRAA >90 07/27/2013 1128    Lab Results  Component Value Date   TSH 1.193 01/27/2019      ASSESSMENT AND PLAN 60 y.o. year old male  has a past medical history of Anxiety, DJD (degenerative joint disease), H/O cardiovascular stress test (2013), H/O echocardiogram (2012), History of cardiac catheterization, Hypertension, Obesity, Spinal stenosis, and Thrombocytopenia ([place]). here with :  1.  History of sleep apnea 2.  Daytime sleepiness  -We will repeat a home sleep study to evaluate for sleep apnea -Patient is amenable to this plan.   I spent 20 minutes of face-to-face and non-face-to-face time with patient.  This included previsit chart review, lab review, study review, order entry, electronic health record documentation, patient education.  Ward Givens, MSN, NP-C 12/04/2019, 2:43 PM [facility] [address] [place], [place] [address] [phone]

## 2019-12-04 NOTE — Patient Instructions (Signed)
Your Plan:  Will discuss if a sleep study will be approved If your symptoms worsen or you develop new symptoms please let us know.   Thank you for coming to see [name] at [facility]. I hope we have been able to provide you high quality care today.  You may receive a patient satisfaction survey over the next few weeks. We would appreciate your feedback and comments so that we may continue to improve ourselves and the health of our patients.

## 2019-12-08 ENCOUNTER — Other Ambulatory Visit: Payer: Self-pay | Admitting: Neurology

## 2019-12-08 DIAGNOSIS — G479 Sleep disorder, unspecified: Secondary | ICD-10-CM

## 2019-12-08 DIAGNOSIS — Z8669 Personal history of other diseases of the nervous system and sense organs: Secondary | ICD-10-CM

## 2019-12-08 DIAGNOSIS — R519 Headache, unspecified: Secondary | ICD-10-CM

## 2019-12-09 ENCOUNTER — Telehealth: Payer: Self-pay

## 2019-12-09 NOTE — Telephone Encounter (Signed)
FYI LSL, spoke with pt. Pt sent a mychart message and had a couple questions. Pt received an alert from [facility] about receiving his Covid Vaccine. Pt has received his vaccine and said it's probably a message that every pt is getting in my chart. No need to address that question. Pt asked if we had any Amitiza samples and we currently don't. Pt is aware that if samples become available, we will contact him. Pt's PCP filled out pt assistance forms for Amitiza and asked pt to have LSL sign the forms. Pt is aware that our office could've started pt assistance forms for him. Pt stated that his PCP already started the process and just needs a signature. Pt is stopping by our office for signature this week.

## 2019-12-09 NOTE — Telephone Encounter (Signed)
Noted. Done.

## 2019-12-10 ENCOUNTER — Telehealth: Payer: Self-pay | Admitting: Emergency Medicine

## 2019-12-10 NOTE — Telephone Encounter (Signed)
Spoke to pt let him know his insurance company sent over paperwork for [employer], completed and faxed. Should know if its approved in 2-3 days. Pt also stated he is completing pt finical assistant paperwork for amitiza just in case they wont cover it anymore

## 2019-12-11 NOTE — Telephone Encounter (Signed)
PA was approved for Amitiza through 07/23/20. Approval letter will be scanned in pts chart. Left a detailed message about pts approval.

## 2019-12-22 DIAGNOSIS — I10 Essential (primary) hypertension: Secondary | ICD-10-CM | POA: Diagnosis not present

## 2019-12-22 DIAGNOSIS — E7849 Other hyperlipidemia: Secondary | ICD-10-CM | POA: Diagnosis not present

## 2019-12-22 DIAGNOSIS — M48062 Spinal stenosis, lumbar region with neurogenic claudication: Secondary | ICD-10-CM | POA: Diagnosis not present

## 2019-12-29 DIAGNOSIS — D696 Thrombocytopenia, unspecified: Secondary | ICD-10-CM | POA: Diagnosis not present

## 2019-12-29 DIAGNOSIS — R5383 Other fatigue: Secondary | ICD-10-CM | POA: Diagnosis not present

## 2019-12-29 DIAGNOSIS — R790 Abnormal level of blood mineral: Secondary | ICD-10-CM | POA: Diagnosis not present

## 2020-01-01 DIAGNOSIS — R790 Abnormal level of blood mineral: Secondary | ICD-10-CM | POA: Diagnosis not present

## 2020-01-21 DIAGNOSIS — M48062 Spinal stenosis, lumbar region with neurogenic claudication: Secondary | ICD-10-CM | POA: Diagnosis not present

## 2020-01-21 DIAGNOSIS — E7849 Other hyperlipidemia: Secondary | ICD-10-CM | POA: Diagnosis not present

## 2020-01-21 DIAGNOSIS — I10 Essential (primary) hypertension: Secondary | ICD-10-CM | POA: Diagnosis not present

## 2020-01-22 DIAGNOSIS — F418 Other specified anxiety disorders: Secondary | ICD-10-CM | POA: Diagnosis not present

## 2020-01-22 DIAGNOSIS — R69 Illness, unspecified: Secondary | ICD-10-CM | POA: Diagnosis not present

## 2020-01-28 ENCOUNTER — Ambulatory Visit (INDEPENDENT_AMBULATORY_CARE_PROVIDER_SITE_OTHER): Payer: Medicare HMO | Admitting: Neurology

## 2020-01-28 DIAGNOSIS — G4710 Hypersomnia, unspecified: Secondary | ICD-10-CM

## 2020-01-28 DIAGNOSIS — R519 Headache, unspecified: Secondary | ICD-10-CM

## 2020-01-28 DIAGNOSIS — G479 Sleep disorder, unspecified: Secondary | ICD-10-CM

## 2020-01-28 DIAGNOSIS — M5481 Occipital neuralgia: Secondary | ICD-10-CM

## 2020-01-28 DIAGNOSIS — G4733 Obstructive sleep apnea (adult) (pediatric): Secondary | ICD-10-CM | POA: Diagnosis not present

## 2020-01-28 DIAGNOSIS — G4737 Central sleep apnea in conditions classified elsewhere: Secondary | ICD-10-CM

## 2020-01-28 DIAGNOSIS — G4730 Sleep apnea, unspecified: Secondary | ICD-10-CM

## 2020-01-28 DIAGNOSIS — Z8669 Personal history of other diseases of the nervous system and sense organs: Secondary | ICD-10-CM

## 2020-01-28 DIAGNOSIS — F1190 Opioid use, unspecified, uncomplicated: Secondary | ICD-10-CM

## 2020-02-02 DIAGNOSIS — R69 Illness, unspecified: Secondary | ICD-10-CM | POA: Diagnosis not present

## 2020-02-06 DIAGNOSIS — M47812 Spondylosis without myelopathy or radiculopathy, cervical region: Secondary | ICD-10-CM | POA: Diagnosis not present

## 2020-02-11 ENCOUNTER — Encounter: Payer: Self-pay | Admitting: Adult Health

## 2020-02-11 NOTE — Telephone Encounter (Signed)
Hey were you able to reach out to this patient?

## 2020-02-17 DIAGNOSIS — M545 Low back pain: Secondary | ICD-10-CM | POA: Diagnosis not present

## 2020-02-17 DIAGNOSIS — M47812 Spondylosis without myelopathy or radiculopathy, cervical region: Secondary | ICD-10-CM | POA: Diagnosis not present

## 2020-02-17 DIAGNOSIS — M5417 Radiculopathy, lumbosacral region: Secondary | ICD-10-CM | POA: Diagnosis not present

## 2020-02-17 DIAGNOSIS — Z5181 Encounter for therapeutic drug level monitoring: Secondary | ICD-10-CM | POA: Diagnosis not present

## 2020-02-17 DIAGNOSIS — D696 Thrombocytopenia, unspecified: Secondary | ICD-10-CM | POA: Diagnosis not present

## 2020-02-17 DIAGNOSIS — M533 Sacrococcygeal disorders, not elsewhere classified: Secondary | ICD-10-CM | POA: Diagnosis not present

## 2020-02-17 DIAGNOSIS — M5412 Radiculopathy, cervical region: Secondary | ICD-10-CM | POA: Diagnosis not present

## 2020-02-17 DIAGNOSIS — G8929 Other chronic pain: Secondary | ICD-10-CM | POA: Diagnosis not present

## 2020-02-20 DIAGNOSIS — M48062 Spinal stenosis, lumbar region with neurogenic claudication: Secondary | ICD-10-CM | POA: Diagnosis not present

## 2020-02-20 DIAGNOSIS — G479 Sleep disorder, unspecified: Secondary | ICD-10-CM | POA: Insufficient documentation

## 2020-02-20 DIAGNOSIS — E7849 Other hyperlipidemia: Secondary | ICD-10-CM | POA: Diagnosis not present

## 2020-02-20 DIAGNOSIS — Z8669 Personal history of other diseases of the nervous system and sense organs: Secondary | ICD-10-CM | POA: Insufficient documentation

## 2020-02-20 DIAGNOSIS — I10 Essential (primary) hypertension: Secondary | ICD-10-CM | POA: Diagnosis not present

## 2020-02-20 NOTE — Addendum Note (Signed)
Addended by: Larey Seat on: 02/20/2020 10:17 AM   Modules accepted: Orders

## 2020-02-20 NOTE — Procedures (Signed)
Patient Information     First Name: Jerome Last Name: Vernel [name]. ID: [id number]  Birth Date: 2060-09-06 Age: 59 Gender: Male  Referring Provider: Dr. Bronson Ing, Ward Givens, NP  BMI: 27.2 (W=200 lb, H=6' 0'')  Neck Circ.:  15.5  Epworth Sleepiness score: not included  Sleep Study Information    Study Date: 01/28/20 S/H/A Version: 001.001.001.001 / 4.0.1515 / 77  History:     Dr. Gerarda Fraction.  The patient presents for with reports of nocturnal palpitations, dry mouth.  He is using opioids rarely for treatment of pain / followed by [facility] . He is still concerned of central apnea. 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 Dr Oneida Alar, vascular surgeon also follows him.  Evaluate for daytime sleepiness/  Apnea and subtype.   Notes by Dr Bronson Ing- He had a normal coronary angiogram . He has a history of venous varicosities, hypertension, obstructive sleep apnea, and hyperlipidemia. ECG performed in the office today which I ordered and personally interpreted demonstrates normal sinus rhythm with no ischemic ST segment or T-wave abnormalities, nor any arrhythmias     Summary & Diagnosis:      There is evidence of severe sleep apnea - AHI of 41.4/h and with NREM accentuation, which is an indirect indication of a central component to apnea. Oxygen nadir was at 77% SPo2 and there were many short desaturation events.  Recommendations:     While a HST is able to indicate severity of apnea, it may fail to recognize Central versus Obstructive type SA. Given the severity and associated desaturation index, I would rather see this patient titrated in an attended sleep study.  If an attended sleep study is not possible or not approved by insurance, I would suggest auto CPAP as Plan B, but request a RV after only 30-60 days of use, in case central apnea emerges under treatment.   Interpreting Physician: Larey Seat, MD            Sleep Summary      Oxygen Saturation Statistics     Start Study Time: End Study Time: Total Recording Time:  9:00:50 PM 7:04:51 AM 10 h, 4 min  Total Sleep Time % REM of Sleep Time:  9 h, 2 min  22.3    Mean: 92 Minimum: 77 Maximum: 98  Mean of Desaturations Nadirs (%):   89  Oxygen Desaturation. %:   4-9 10-20 >20 Total  Events Number Total   186  32 85.3 14.7  0 0.0  218 100.0  Oxygen Saturation: <90 <=88 <85 <80 <70  Duration (minutes): Sleep % 20.3 3.7 12.6 3.2 2.3 0.6 0.2 0.0 0.0 0.0     Respiratory Indices      Total Events REM NREM All Night  pRDI:  344  pAHI:  322 ODI:  218  pAHIc:  44  % CSR: 0.0 37.0 32.0 27.0 3.6 45.8 43.5 28.2 6.1 44.2 41.4 28.0 5.7       Pulse Rate Statistics during Sleep (BPM)      Mean:  64 Minimum: 44 Maximum: 100    Indices are calculated using technically valid sleep time of 7 h, 46 min. pRDI/pAHI are calculated using 02 desaturations ? 3%  Body Position Statistics  Position Supine Prone Right Left Non-Supine  Sleep (min) 541.0 0.0 0.0 0.0 0.0  Sleep % 99.8 0.0 0.0 0.0 0.0  pRDI 44.2 N/A N/A N/A N/A  pAHI 41.4 N/A N/A N/A N/A  ODI  28.1 N/A N/A N/A N/A     Snoring Statistics Snoring Level (dB) >40 >50 >60 >70 >80 >Threshold (45)  Sleep (min) 296.0 64.4 7.0 0.0 0.0 108.2  Sleep % 54.6 11.9 1.3 0.0 0.0 20.0    Mean: 43 dB

## 2020-02-23 ENCOUNTER — Telehealth: Payer: Self-pay | Admitting: Neurology

## 2020-02-23 ENCOUNTER — Encounter: Payer: Self-pay | Admitting: Neurology

## 2020-02-23 NOTE — Telephone Encounter (Signed)
-----   Message from Larey Seat, MD sent at 02/20/2020 10:17 AM EDT ----- There is evidence of severe sleep apnea - AHI of 41.4/h and with  NREM accentuation, which is an indirect indication of a central  component to apnea. Oxygen nadir was at 77% SPo2 and there were  many short desaturation events.  Recommendations:    While a HST is able to indicate severity of apnea, it may fail to  recognize Central versus Obstructive type SA. Given the severity  and associated desaturation index, I would rather see this  patient titrated in an attended sleep study.  If an attended sleep study is not possible or not approved by  insurance, I would suggest auto CPAP as Plan B, but request a RV with ordering NP after only 30-60 days of use, in case that central apnea does emerge under PAP treatment.

## 2020-02-23 NOTE — Telephone Encounter (Signed)
Called patient to discuss sleep study results. No answer at this time. LVM for the patient to call back.  Will also send a mychart message.  

## 2020-02-24 NOTE — Telephone Encounter (Signed)
HST results were posted.

## 2020-02-27 DIAGNOSIS — M48062 Spinal stenosis, lumbar region with neurogenic claudication: Secondary | ICD-10-CM | POA: Diagnosis not present

## 2020-02-27 DIAGNOSIS — G8929 Other chronic pain: Secondary | ICD-10-CM | POA: Diagnosis not present

## 2020-02-27 DIAGNOSIS — E7849 Other hyperlipidemia: Secondary | ICD-10-CM | POA: Diagnosis not present

## 2020-02-27 DIAGNOSIS — Z6828 Body mass index (BMI) 28.0-28.9, adult: Secondary | ICD-10-CM | POA: Diagnosis not present

## 2020-02-27 DIAGNOSIS — E663 Overweight: Secondary | ICD-10-CM | POA: Diagnosis not present

## 2020-02-27 DIAGNOSIS — K5909 Other constipation: Secondary | ICD-10-CM | POA: Diagnosis not present

## 2020-02-27 DIAGNOSIS — I10 Essential (primary) hypertension: Secondary | ICD-10-CM | POA: Diagnosis not present

## 2020-02-27 DIAGNOSIS — K649 Unspecified hemorrhoids: Secondary | ICD-10-CM | POA: Diagnosis not present

## 2020-02-27 DIAGNOSIS — R69 Illness, unspecified: Secondary | ICD-10-CM | POA: Diagnosis not present

## 2020-03-03 DIAGNOSIS — Z20828 Contact with and (suspected) exposure to other viral communicable diseases: Secondary | ICD-10-CM | POA: Diagnosis not present

## 2020-03-07 ENCOUNTER — Ambulatory Visit (INDEPENDENT_AMBULATORY_CARE_PROVIDER_SITE_OTHER): Payer: Medicare HMO | Admitting: Neurology

## 2020-03-07 DIAGNOSIS — M5481 Occipital neuralgia: Secondary | ICD-10-CM

## 2020-03-07 DIAGNOSIS — G4731 Primary central sleep apnea: Secondary | ICD-10-CM | POA: Diagnosis not present

## 2020-03-07 DIAGNOSIS — G4730 Sleep apnea, unspecified: Secondary | ICD-10-CM

## 2020-03-07 DIAGNOSIS — G4710 Hypersomnia, unspecified: Secondary | ICD-10-CM

## 2020-03-07 DIAGNOSIS — G4737 Central sleep apnea in conditions classified elsewhere: Secondary | ICD-10-CM

## 2020-03-11 DIAGNOSIS — R69 Illness, unspecified: Secondary | ICD-10-CM | POA: Diagnosis not present

## 2020-03-15 DIAGNOSIS — M47812 Spondylosis without myelopathy or radiculopathy, cervical region: Secondary | ICD-10-CM | POA: Diagnosis not present

## 2020-03-17 DIAGNOSIS — M5481 Occipital neuralgia: Secondary | ICD-10-CM | POA: Insufficient documentation

## 2020-03-17 NOTE — Procedures (Signed)
PATIENT'S NAME:  Jerome [name], Jerome [name] DOB:      09/26/1960      MR#:    [id number]     DATE OF RECORDING: 03/07/2020 [facility] REFERRING M.D.:  Ward Givens, NP  Study Performed:   CPAP  Titration HISTORY:  Patient returning for a CPAP Titration sleep study after having an HST on 01/28/20. Patient had an AHI of 41.4 /h.-evidence of severe sleep apnea - with NREM accentuation, which is an indirect indication of a central component to apnea. Oxygen nadir was at 77% SPo2 and there were many short desaturation events. CC; Dr Bronson Ing and Dr. Oneida Alar.   The patient endorsed the Epworth Sleepiness Scale at N/A points.   The patient's weight 200 pounds with a height of 72 (inches), resulting in a BMI of 27.2 kg/m2. The patient's neck circumference measured 18 inches.  CURRENT MEDICATIONS: Tylenol, B complex, Dulcolax, Zyrtec, Klonopin, Flexeril, Colace, Flonase, Cozaar, Amitiza, Multivitamin, Prilosec, Pravachol, Zoloft.    PROCEDURE:  This is a multichannel digital polysomnogram utilizing the SomnoStar 11.2 system.  Electrodes and sensors were applied and monitored per AASM Specifications.   EEG, EOG, Chin and Limb EMG, were sampled at 200 Hz.  ECG, Snore and Nasal Pressure, Thermal Airflow, Respiratory Effort, CPAP Flow and Pressure, Oximetry was sampled at 50 Hz. Digital video and audio were recorded.      CPAP was initiated under a F&P Simplus Medium full -face mask. No other mask was tried or fitted.  at 5 cmH20 with heated humidity per AASM split night standards and pressure was advanced to 13 cmH20 because of hypopneas, apneas and desaturations.  At a PAP pressure of 13 cmH20, there was a reduction of the AHI to 0.0/h over 52 minutes of sleep time with improvement of sleep apnea. Lights Out was at 21:59 and Lights On at 04:59. Total recording time (TRT) was 421 minutes, with a total sleep time (TST) of 376.5 minutes. The patient's sleep latency was 33.5 minutes. REM latency was 270 minutes.  The sleep  efficiency was 89.4 %.    SLEEP ARCHITECTURE: WASO (Wake after sleep onset) was 30.5 minutes. There were 8.5 minutes in Stage N1, 336.5 minutes Stage N2, 0 minutes Stage N3 and 31.5 minutes in Stage REM.  The percentage of Stage N1 was 2.3%, Stage N2 was 89.4%, Stage N3 was 0% and Stage R (REM sleep) was 8.4%.   RESPIRATORY ANALYSIS:  There was a total of 24 respiratory events: 12 obstructive apneas, 0 central apneas and 0 mixed apneas with a total of 12 apneas and an apnea index (AI) of 1.9 /hour. There were 12 hypopneas.  The total APNEA/HYPOPNEA INDEX (AHI) was 3.8 /hour.  2 events occurred in REM sleep and 22 events in NREM. The REM AHI was 3.8 /hour versus a non-REM AHI of 3.8 /hour.  The patient spent 376.5 minutes of total sleep time in the supine position and 0 minutes in non-supine. The supine AHI was 3.8, versus a non-supine AHI of 0.0/h.  OXYGEN SATURATION & C02:  The baseline 02 saturation was 93%, with the lowest being 80%. Time spent below 89% saturation equaled 21 minutes.  The arousals were noted as: 27 were spontaneous, 21 were associated with PLMs, 6 were associated with respiratory events. The patient had a total of 473 Periodic Limb Movements. The Periodic Limb Movement (PLM) Arousal index was 3.3 /hour. No PLM in REM.  Snoring was noted. EKG was in keeping with normal sinus rhythm (NSR).   DIAGNOSIS  1. Obstructive Sleep Apnea with intermittent hypoxemia, AHI was successfully reduced to 0.0 under 13 cm water pressured CPAP. Mask handed to the patient here was a Simplus FFM medium size.  2. Periodic Limb Movement Disorder, but infrequent arousals.    PLANS/RECOMMENDATIONS: 1. CPAP therapy is recommended with an autotitration capable device setting of 6 through 16 cm water pressure, heated humidification and mask of patient's choice. The patient will have the opportunity to choose a nasal mask or pillow if desired.     DISCUSSION: A follow up appointment will be scheduled  in the Sleep Clinic at [facility].   Please call [phone] with any questions.      I certify that I have reviewed the entire raw data recording prior to the issuance of this report in accordance with the Standards of Accreditation of the American Academy of Sleep Medicine (AASM)    Larey Seat, M.D. Diplomat, Tax adviser of Psychiatry and Neurology  Diplomat, Tax adviser of Sleep Medicine Market researcher, [employer] [facility] at [employer]

## 2020-03-17 NOTE — Progress Notes (Signed)
DIAGNOSIS  1. Obstructive Sleep Apnea with intermittent hypoxemia, AHI was  successfully reduced to 0.0/h under 13 cm water pressure setting of CPAP.  Mask handed to the patient here was a Simplus FFM medium size.  2. Periodic Limb Movement Disorder, but infrequent arousals.    PLANS/RECOMMENDATIONS:  1. CPAP therapy is recommended with an autotitration capable  device setting of 6 through 16 cm water pressure, heated  humidification and mask of patient's choice. The patient will  have the opportunity to choose a nasal mask or pillow if desired.

## 2020-03-17 NOTE — Addendum Note (Signed)
Addended by: Larey Seat on: 03/17/2020 06:05 PM   Modules accepted: Orders

## 2020-03-18 ENCOUNTER — Telehealth: Payer: Self-pay | Admitting: Neurology

## 2020-03-18 NOTE — Telephone Encounter (Signed)
I called pt. I advised pt that Dr. Brett Fairy reviewed their sleep study results and found that pt tolerated the CPAP . Dr. Brett Fairy recommends that pt starts an auto CPAP. I reviewed PAP compliance expectations with the pt. Pt is agreeable to starting a CPAP. I advised pt that an order will be sent to a DME, [facility] and [facility] will call the pt within about one week after they file with the pt's insurance. [facility]) will show the pt how to use the machine, fit for masks, and troubleshoot the CPAP if needed. A follow up appt was made for insurance purposes with Ward Givens, NP on Nov 18,2021 at 10 am . Pt verbalized understanding to arrive 15 minutes early and bring their CPAP. A letter with all of this information in it will be mailed to the pt as a reminder. I verified with the pt that the address we have on file is correct. Pt verbalized understanding of results. Pt had no questions at this time but was encouraged to call back if questions arise. I have sent the order to [facility]) and have received confirmation that they have received the order.

## 2020-03-18 NOTE — Telephone Encounter (Signed)
-----   Message from Larey Seat, MD sent at 03/17/2020  6:05 PM EDT ----- DIAGNOSIS  1. Obstructive Sleep Apnea with intermittent hypoxemia, AHI was  successfully reduced to 0.0/h under 13 cm water pressure setting of CPAP.  Mask handed to the patient here was a Simplus FFM medium size.  2. Periodic Limb Movement Disorder, but infrequent arousals.    PLANS/RECOMMENDATIONS:  1. CPAP therapy is recommended with an autotitration capable  device setting of 6 through 16 cm water pressure, heated  humidification and mask of patient's choice. The patient will  have the opportunity to choose a nasal mask or pillow if desired.

## 2020-03-23 DIAGNOSIS — M47812 Spondylosis without myelopathy or radiculopathy, cervical region: Secondary | ICD-10-CM | POA: Diagnosis not present

## 2020-03-23 DIAGNOSIS — E7849 Other hyperlipidemia: Secondary | ICD-10-CM | POA: Diagnosis not present

## 2020-03-23 DIAGNOSIS — I10 Essential (primary) hypertension: Secondary | ICD-10-CM | POA: Diagnosis not present

## 2020-03-23 DIAGNOSIS — M48062 Spinal stenosis, lumbar region with neurogenic claudication: Secondary | ICD-10-CM | POA: Diagnosis not present

## 2020-03-26 DIAGNOSIS — R69 Illness, unspecified: Secondary | ICD-10-CM | POA: Diagnosis not present

## 2020-03-30 ENCOUNTER — Other Ambulatory Visit: Payer: Self-pay

## 2020-03-30 ENCOUNTER — Ambulatory Visit (HOSPITAL_COMMUNITY)
Admission: RE | Admit: 2020-03-30 | Discharge: 2020-03-30 | Disposition: A | Discharge status: Home / Self Care | Payer: Medicare HMO | Source: Ambulatory Visit | Attending: Acute Care | Admitting: Acute Care

## 2020-03-30 DIAGNOSIS — F17200 Nicotine dependence, unspecified, uncomplicated: Secondary | ICD-10-CM | POA: Diagnosis present

## 2020-03-30 DIAGNOSIS — J439 Emphysema, unspecified: Secondary | ICD-10-CM | POA: Insufficient documentation

## 2020-03-30 DIAGNOSIS — F17210 Nicotine dependence, cigarettes, uncomplicated: Secondary | ICD-10-CM

## 2020-03-30 DIAGNOSIS — Z122 Encounter for screening for malignant neoplasm of respiratory organs: Secondary | ICD-10-CM | POA: Diagnosis not present

## 2020-03-30 DIAGNOSIS — R69 Illness, unspecified: Secondary | ICD-10-CM | POA: Diagnosis not present

## 2020-04-01 NOTE — Progress Notes (Signed)

## 2020-04-02 ENCOUNTER — Encounter: Payer: Self-pay | Admitting: Adult Health

## 2020-04-02 ENCOUNTER — Other Ambulatory Visit: Payer: Self-pay | Admitting: *Deleted

## 2020-04-02 DIAGNOSIS — F17210 Nicotine dependence, cigarettes, uncomplicated: Secondary | ICD-10-CM

## 2020-04-05 DIAGNOSIS — Z20828 Contact with and (suspected) exposure to other viral communicable diseases: Secondary | ICD-10-CM | POA: Diagnosis not present

## 2020-04-06 DIAGNOSIS — M47812 Spondylosis without myelopathy or radiculopathy, cervical region: Secondary | ICD-10-CM | POA: Diagnosis not present

## 2020-04-14 DIAGNOSIS — R350 Frequency of micturition: Secondary | ICD-10-CM | POA: Diagnosis not present

## 2020-04-14 DIAGNOSIS — Z681 Body mass index (BMI) 19 or less, adult: Secondary | ICD-10-CM | POA: Diagnosis not present

## 2020-04-14 DIAGNOSIS — Z20828 Contact with and (suspected) exposure to other viral communicable diseases: Secondary | ICD-10-CM | POA: Diagnosis not present

## 2020-04-14 DIAGNOSIS — L03818 Cellulitis of other sites: Secondary | ICD-10-CM | POA: Diagnosis not present

## 2020-04-15 DIAGNOSIS — R531 Weakness: Secondary | ICD-10-CM | POA: Diagnosis not present

## 2020-04-15 DIAGNOSIS — G959 Disease of spinal cord, unspecified: Secondary | ICD-10-CM | POA: Diagnosis not present

## 2020-04-15 DIAGNOSIS — R2681 Unsteadiness on feet: Secondary | ICD-10-CM | POA: Diagnosis not present

## 2020-04-20 DIAGNOSIS — M47812 Spondylosis without myelopathy or radiculopathy, cervical region: Secondary | ICD-10-CM | POA: Diagnosis not present

## 2020-04-20 DIAGNOSIS — G894 Chronic pain syndrome: Secondary | ICD-10-CM | POA: Diagnosis not present

## 2020-04-20 DIAGNOSIS — M549 Dorsalgia, unspecified: Secondary | ICD-10-CM | POA: Diagnosis not present

## 2020-05-03 NOTE — Progress Notes (Signed)
Referring Provider: Redmond School, MD Primary Care Physician:  Redmond School, MD  Primary GI: Dr. Gala Romney   Chief Complaint  Patient presents with  . Constipation    takes docusate at night and amitiza in the mornings, stools are runny, occas RB    HPI:   Jerome [name]. is a 59 y.o. male presenting today with a history of chronic constipation in setting of opioids and severe spinal stenosis. Historically has done well with Amitiza 24 mcg BID, but insurance has been an issue in the past. Colonoscopy due in 2024. Last complete colonoscopy in 2014. Flex sig in 2015.   He notes he was diagnosed with IDA last year. Ferritin was 11 in June 2021. Recent Hgb 16.3. Ferritin now 45. Chronic thrombocytopenia noted. Last imaging on file from 10 years ago with fatty liver, splenomegaly.   Down on once daily Amitiza and 3 stool softeners, 3 dulculax at night. Amitiza in the morning. If doesn't take anything, will have 12-13 times of needing to go the bathroom. Will have RUQ pain if doesn't empty out completely. Has been rationing out Amitiza to once daily.   Has some tissue prolapsing at times. 3.5 weeks ago felt a nodule near rectum, got irritated, inflamed, then ruptured and looked like pinkish fluid. Using hemorrhoid suppositories during this flare. Feels like insides are falling out.    Takes a few ibuprofen sparingly.     Past Medical History:  Diagnosis Date  . Anxiety   . DJD (degenerative joint disease)   . H/O cardiovascular stress test 2013  . H/O echocardiogram 2012   for cad, htn & obesity  . History of cardiac catheterization    a. normal cors by cath in 2011  . Hypertension   . Obesity   . Spinal stenosis   . Thrombocytopenia ([name])     Past Surgical History:  Procedure Laterality Date  . BACK SURGERY     x 4  . BREAST REDUCTION SURGERY    . CARDIAC CATHETERIZATION  2011   5 frech sheath  . COLONOSCOPY N/A 10/14/2012   [facility]:[id number] rectum and colon  .  CYST EXCISION Right 08/08/2016   Procedure: EXCISION OF RIGHT INGUINAL SUBCUTANEOUS MASS (LIKELY SEBACEIYS CYST);  Surgeon: Vickie Epley, MD;  Location: [facility];  Service: General;  Laterality: Right;  7cm x 4cm mass; dermabond dressing  . FLEXIBLE SIGMOIDOSCOPY N/A 10/15/2013   markedly inflamed distal rectum with ulceration, rectal polyp benign. Acute ulceration of rectum.   . h/o cardiac cath    . LAPAROSCOPIC GASTRIC SLEEVE RESECTION  06/2012   Dr Mickie Bail [facility])    Current Outpatient Medications  Medication Sig Dispense Refill  . acetaminophen (TYLENOL) 500 MG tablet Take 1,000 mg by mouth 2 (two) times daily as needed for moderate pain or headache.     . B COMPLEX VITAMINS SL Place 1 Dose under the tongue daily. Liquid    . bisacodyl (DULCOLAX) 5 MG EC tablet Take 15 mg by mouth at bedtime.    . Calcium Carb-Cholecalciferol (CALCIUM/VITAMIN D PO) Take 1 tablet by mouth daily.    . cetirizine (ZYRTEC) 10 MG tablet Take 10 mg by mouth as needed for allergies.    . clonazePAM (KLONOPIN) 1 MG tablet Take 1 mg by mouth 2 (two) times daily.     . cyclobenzaprine (FLEXERIL) 5 MG tablet Take 1 tablet (5 mg total) by mouth 3 (three) times daily as needed for muscle spasms. Only  as needed. (Patient taking differently: Take 5 mg by mouth at bedtime. Only as needed.) 30 tablet 1  . docusate sodium (COLACE) 100 MG capsule Take 300 mg by mouth at bedtime.    Marland Kitchen ECHINACEA PO Take 1 capsule by mouth daily. seasonally    . ferrous sulfate 325 (65 FE) MG tablet Take 325 mg by mouth daily with breakfast.    . Flaxseed, Linseed, (FLAX SEED OIL PO) Take 1 tablet by mouth daily.     . fluticasone (FLONASE) 50 MCG/ACT nasal spray Place 2 sprays into both nostrils daily as needed for congestion.  5  . losartan (COZAAR) 25 MG tablet Take 0.5 tablets (12.5 mg total) by mouth daily. 45 tablet 3  . lubiprostone (AMITIZA) 24 MCG capsule TAKE 1 CAPSULE BY MOUTH TWICE DAILY WITH FOOD 60 capsule 5  . Multiple Vitamin  (MULTIVITAMIN WITH MINERALS) TABS tablet Take 1 tablet by mouth daily.    Marland Kitchen omeprazole (PRILOSEC) 20 MG capsule Take 20 mg by mouth every other day.     . pravastatin (PRAVACHOL) 40 MG tablet Take 40 mg by mouth daily.    . sertraline (ZOLOFT) 100 MG tablet Take 100 mg by mouth daily.     Marland Kitchen zinc gluconate 50 MG tablet Take 25 mg by mouth daily.      No current facility-administered medications for this visit.    Allergies as of 05/04/2020 - Review Complete 05/04/2020  Allergen Reaction Noted  . Aspirin Nausea And Vomiting 03/27/2014  . Codeine Nausea And Vomiting 10/14/2012  . Nsaids Anxiety 07/27/2013    Family History  Problem Relation Age of Onset  . Heart attack Father   . Colon cancer Paternal Grandmother   . Brain cancer Brother 16    Social History   Socioeconomic History  . Marital status: Divorced    Spouse name: Not on file  . Number of children: 2  . Years of education: Not on file  . Highest education level: Not on file  Occupational History  . 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 Smoking status: Former Smoker    Packs/day: 2.00    Years: 36.00    Pack years: 72.00    Types: Cigarettes  . Smokeless tobacco: Never Used  Vaping Use  . Vaping Use: Never used  Substance and Sexual Activity  . Alcohol use: Yes    Alcohol/week: 0.0 standard drinks    Comment: occasionally   . Drug use: No  . Sexual activity: Yes    Partners: Male  Other Topics Concern  . Not on file  Social History Narrative   Moving to [place] to care for brother w/ brain tumor   Lives w/ 2 daughters & 4 grandsons   Social Determinants of Health   Financial Resource Strain:   . Difficulty of Paying Living Expenses: Not on file  Food Insecurity:   . Worried About Charity fundraiser in the Last Year: Not on file  . Ran Out of Food in the Last Year: Not on file  Transportation Needs:   . Lack of Transportation (Medical): Not on file  . Lack of  Transportation (Non-Medical): Not on file  Physical Activity:   . Days of Exercise per Week: Not on file  . Minutes of Exercise per Session: Not on file  Stress:   . Feeling of Stress : Not on file  Social Connections:   . Frequency of Communication with Friends and Family: Not on  file  . Frequency of Social Gatherings with Friends and Family: Not on file  . Attends Religious Services: Not on file  . Active Member of Clubs or Organizations: Not on file  . Attends Archivist Meetings: Not on file  . Marital Status: Not on file    Review of Systems: Gen: Denies fever, chills, anorexia. Denies fatigue, weakness, weight loss.  CV: Denies chest pain, palpitations, syncope, peripheral edema, and claudication. Resp: Denies dyspnea at rest, cough, wheezing, coughing up blood, and pleurisy. GI: see HPI Derm: Denies rash, itching, dry skin Psych: Denies depression, anxiety, memory loss, confusion. No homicidal or suicidal ideation.  Heme: see HPI  Physical Exam: BP 132/88   Pulse 81   Temp (!) 97.1 F (36.2 C) (Oral)   Ht 6' (1.829 m)   Wt 205 lb (93 kg)   BMI 27.80 kg/m  General:   Alert and oriented. No distress noted. Pleasant and cooperative.  Head:  Normocephalic and atraumatic. Eyes:  Conjuctiva clear without scleral icterus. Mouth:  Mask in place Abdomen:  +BS, soft, non-tender and non-distended. No rebound or guarding. No HSM or masses noted. Rectal: external hemorrhoid anteriorly, non-thrombosed. Left lateral prolapsing internal hemorrhoid. Appears to have folliculitis surrounding rectum. Msk:  Symmetrical without gross deformities. Normal posture. Extremities:  Without edema. Neurologic:  Alert and  oriented x4 Psych:  Alert and cooperative. Normal mood and affect.  ASSESSMENT: Jerome [name]. is a 59 y.o. male presenting today with a history of chronic constipation in setting of chronic opioids, historically doing well on Amitiza. Due to insurance issues, he  has had to ration this out to once daily but doing fairly well with addition of OTC agents in evening.   Newly diagnosed with IDA last year, with ferritin 02 January 2020. Has been on iron. Recent ferritin in the 40s, Hgb 16.3. Episode of rectal bleeding recently in setting of likely hemorrhoids. However, he has not had a recent colonoscopy, with last complete in 2014; flex sig is on file from 2015. Recommend colonoscopy at this time due to IDA, +/- EGD.   In setting of chronic thrombocytopenia, need to rule out occult liver disease. Platelets 128 recently. Review of images dating back 10 years ago with fatty liver, hepatosplenomegaly. No recent abdominal imaging. Updating US abdomen complete now.    PLAN:   Proceed with colonoscopy/EGD by Dr. Gala Romney in near future using PROPOFOL: the risks, benefits, and alternatives have been discussed with the patient in detail. The patient states understanding and desires to proceed.  Anusol BID per rectum  Amitiza 24 mcg at least once daily: I have sent in refills for BID dosing (patient has been rationing due to insurance cost)  Return in 4 months   Annitta Needs, PhD, [facility]

## 2020-05-04 ENCOUNTER — Encounter: Payer: Self-pay | Admitting: *Deleted

## 2020-05-04 ENCOUNTER — Encounter: Payer: Self-pay | Admitting: Gastroenterology

## 2020-05-04 ENCOUNTER — Ambulatory Visit: Payer: Medicare HMO | Admitting: Gastroenterology

## 2020-05-04 ENCOUNTER — Encounter: Payer: Self-pay | Admitting: Internal Medicine

## 2020-05-04 ENCOUNTER — Other Ambulatory Visit: Payer: Self-pay

## 2020-05-04 VITALS — BP 132/88 | HR 81 | Temp 97.1°F | Ht 72.0 in | Wt 205.0 lb

## 2020-05-04 DIAGNOSIS — Z862 Personal history of diseases of the blood and blood-forming organs and certain disorders involving the immune mechanism: Secondary | ICD-10-CM | POA: Diagnosis not present

## 2020-05-04 DIAGNOSIS — D509 Iron deficiency anemia, unspecified: Secondary | ICD-10-CM | POA: Insufficient documentation

## 2020-05-04 DIAGNOSIS — R790 Abnormal level of blood mineral: Secondary | ICD-10-CM | POA: Diagnosis not present

## 2020-05-04 DIAGNOSIS — K5900 Constipation, unspecified: Secondary | ICD-10-CM | POA: Diagnosis not present

## 2020-05-04 DIAGNOSIS — K649 Unspecified hemorrhoids: Secondary | ICD-10-CM

## 2020-05-04 DIAGNOSIS — D696 Thrombocytopenia, unspecified: Secondary | ICD-10-CM

## 2020-05-04 MED ORDER — LUBIPROSTONE 24 MCG PO CAPS: ORAL_CAPSULE | ORAL | 5 refills | Status: DC

## 2020-05-04 NOTE — Patient Instructions (Addendum)
I believe you are dealing with both an internal and external hemorrhoid. You can use the rectal cream twice a day for 7 days, then give that tissue a "break". Avoid straining, limit toilet time to 2-3 minutes, and keep the bowel regimen you are doing now.  I refilled Amitiza for you.  I have ordered an ultrasound to evaluate your liver and spleen, as there was mention of splenomegaly and fatty liver on ultrasound in 2009. It is a good idea to just get a "lay of the land" and make sure we are not dealing with a coarsened liver or spleen enlargement that is contributing to your low platelets.  We are arranging a colonoscopy and upper endoscopy with Dr. Gala Romney due to new onset iron deficiency anemia. Please stop iron 7 days prior, and please talk with the Hematologist about possible iron infusions if you are unable to tolerate oral iron.   We will see you back in 4 months!   Happy Birthday!!!   It was a pleasure to see you today. I want to create trusting relationships with patients to provide genuine, compassionate, and quality care. I value your feedback. If you receive a survey regarding your visit,  I greatly appreciate you taking time to fill this out.   Annitta Needs, PhD, ANP-BC [facility] Gastroenterology

## 2020-05-05 DIAGNOSIS — R69 Illness, unspecified: Secondary | ICD-10-CM | POA: Diagnosis not present

## 2020-05-05 DIAGNOSIS — I10 Essential (primary) hypertension: Secondary | ICD-10-CM | POA: Diagnosis not present

## 2020-05-05 DIAGNOSIS — G894 Chronic pain syndrome: Secondary | ICD-10-CM | POA: Diagnosis not present

## 2020-05-05 DIAGNOSIS — N529 Male erectile dysfunction, unspecified: Secondary | ICD-10-CM | POA: Diagnosis not present

## 2020-05-05 DIAGNOSIS — G4733 Obstructive sleep apnea (adult) (pediatric): Secondary | ICD-10-CM | POA: Diagnosis not present

## 2020-05-05 DIAGNOSIS — Z9884 Bariatric surgery status: Secondary | ICD-10-CM | POA: Diagnosis not present

## 2020-05-05 DIAGNOSIS — Z6827 Body mass index (BMI) 27.0-27.9, adult: Secondary | ICD-10-CM | POA: Diagnosis not present

## 2020-05-05 DIAGNOSIS — M109 Gout, unspecified: Secondary | ICD-10-CM | POA: Diagnosis not present

## 2020-05-05 DIAGNOSIS — Z0001 Encounter for general adult medical examination with abnormal findings: Secondary | ICD-10-CM | POA: Diagnosis not present

## 2020-05-05 DIAGNOSIS — M519 Unspecified thoracic, thoracolumbar and lumbosacral intervertebral disc disorder: Secondary | ICD-10-CM | POA: Diagnosis not present

## 2020-05-06 ENCOUNTER — Encounter: Payer: Self-pay | Admitting: Adult Health

## 2020-05-06 DIAGNOSIS — E782 Mixed hyperlipidemia: Secondary | ICD-10-CM | POA: Diagnosis not present

## 2020-05-06 DIAGNOSIS — Z0000 Encounter for general adult medical examination without abnormal findings: Secondary | ICD-10-CM | POA: Diagnosis not present

## 2020-05-06 DIAGNOSIS — I10 Essential (primary) hypertension: Secondary | ICD-10-CM | POA: Diagnosis not present

## 2020-05-06 DIAGNOSIS — E7849 Other hyperlipidemia: Secondary | ICD-10-CM | POA: Diagnosis not present

## 2020-05-07 ENCOUNTER — Encounter: Payer: Self-pay | Admitting: Gastroenterology

## 2020-05-10 DIAGNOSIS — Z20822 Contact with and (suspected) exposure to covid-19: Secondary | ICD-10-CM | POA: Diagnosis not present

## 2020-05-11 ENCOUNTER — Other Ambulatory Visit: Payer: Self-pay

## 2020-05-11 ENCOUNTER — Ambulatory Visit (HOSPITAL_COMMUNITY)
Admission: RE | Admit: 2020-05-11 | Discharge: 2020-05-11 | Disposition: A | Discharge status: Home / Self Care | Payer: Medicare HMO | Source: Ambulatory Visit | Attending: Gastroenterology | Admitting: Gastroenterology

## 2020-05-11 DIAGNOSIS — K8020 Calculus of gallbladder without cholecystitis without obstruction: Secondary | ICD-10-CM | POA: Diagnosis not present

## 2020-05-11 DIAGNOSIS — D696 Thrombocytopenia, unspecified: Secondary | ICD-10-CM | POA: Insufficient documentation

## 2020-05-11 DIAGNOSIS — M5489 Other dorsalgia: Secondary | ICD-10-CM | POA: Diagnosis not present

## 2020-05-12 DIAGNOSIS — G4733 Obstructive sleep apnea (adult) (pediatric): Secondary | ICD-10-CM | POA: Diagnosis not present

## 2020-05-13 ENCOUNTER — Encounter: Payer: Self-pay | Admitting: *Deleted

## 2020-05-13 ENCOUNTER — Other Ambulatory Visit: Payer: Self-pay | Admitting: *Deleted

## 2020-05-13 DIAGNOSIS — N2889 Other specified disorders of kidney and ureter: Secondary | ICD-10-CM

## 2020-05-22 DIAGNOSIS — M48062 Spinal stenosis, lumbar region with neurogenic claudication: Secondary | ICD-10-CM | POA: Diagnosis not present

## 2020-05-22 DIAGNOSIS — I10 Essential (primary) hypertension: Secondary | ICD-10-CM | POA: Diagnosis not present

## 2020-05-22 DIAGNOSIS — E7849 Other hyperlipidemia: Secondary | ICD-10-CM | POA: Diagnosis not present

## 2020-05-26 ENCOUNTER — Other Ambulatory Visit: Payer: Self-pay

## 2020-05-26 ENCOUNTER — Other Ambulatory Visit (HOSPITAL_COMMUNITY)
Admission: RE | Admit: 2020-05-26 | Discharge: 2020-05-26 | Disposition: A | Discharge status: Home / Self Care | Payer: Medicare HMO | Source: Ambulatory Visit | Attending: Internal Medicine | Admitting: Internal Medicine

## 2020-05-26 DIAGNOSIS — Z20822 Contact with and (suspected) exposure to covid-19: Secondary | ICD-10-CM | POA: Diagnosis not present

## 2020-05-26 DIAGNOSIS — Z01812 Encounter for preprocedural laboratory examination: Secondary | ICD-10-CM | POA: Diagnosis present

## 2020-05-26 LAB — SARS CORONAVIRUS 2 (TAT 6-24 HRS): SARS Coronavirus 2: NEGATIVE

## 2020-05-27 ENCOUNTER — Ambulatory Visit (HOSPITAL_COMMUNITY): Payer: Medicare HMO

## 2020-05-27 ENCOUNTER — Encounter (HOSPITAL_COMMUNITY): Payer: Self-pay | Admitting: Internal Medicine

## 2020-05-27 ENCOUNTER — Encounter (HOSPITAL_COMMUNITY)
Admission: RE | Disposition: A | Discharge status: Home / Self Care | Payer: Self-pay | Source: Home / Self Care | Attending: Internal Medicine

## 2020-05-27 ENCOUNTER — Ambulatory Visit (HOSPITAL_COMMUNITY)
Admission: RE | Admit: 2020-05-27 | Discharge: 2020-05-27 | Disposition: A | Discharge status: Home / Self Care | Payer: Medicare HMO | Attending: Internal Medicine | Admitting: Internal Medicine

## 2020-05-27 ENCOUNTER — Other Ambulatory Visit: Payer: Self-pay

## 2020-05-27 DIAGNOSIS — I8510 Secondary esophageal varices without bleeding: Secondary | ICD-10-CM | POA: Diagnosis not present

## 2020-05-27 DIAGNOSIS — K625 Hemorrhage of anus and rectum: Secondary | ICD-10-CM | POA: Diagnosis not present

## 2020-05-27 DIAGNOSIS — K3189 Other diseases of stomach and duodenum: Secondary | ICD-10-CM | POA: Diagnosis not present

## 2020-05-27 DIAGNOSIS — R69 Illness, unspecified: Secondary | ICD-10-CM | POA: Diagnosis not present

## 2020-05-27 DIAGNOSIS — D509 Iron deficiency anemia, unspecified: Secondary | ICD-10-CM | POA: Diagnosis not present

## 2020-05-27 DIAGNOSIS — K259 Gastric ulcer, unspecified as acute or chronic, without hemorrhage or perforation: Secondary | ICD-10-CM | POA: Diagnosis not present

## 2020-05-27 DIAGNOSIS — Z9884 Bariatric surgery status: Secondary | ICD-10-CM | POA: Diagnosis not present

## 2020-05-27 DIAGNOSIS — I8500 Esophageal varices without bleeding: Secondary | ICD-10-CM | POA: Diagnosis not present

## 2020-05-27 DIAGNOSIS — K219 Gastro-esophageal reflux disease without esophagitis: Secondary | ICD-10-CM | POA: Diagnosis not present

## 2020-05-27 DIAGNOSIS — K766 Portal hypertension: Secondary | ICD-10-CM

## 2020-05-27 DIAGNOSIS — F17210 Nicotine dependence, cigarettes, uncomplicated: Secondary | ICD-10-CM | POA: Diagnosis not present

## 2020-05-27 HISTORY — PX: BIOPSY: SHX5522

## 2020-05-27 HISTORY — PX: COLONOSCOPY WITH PROPOFOL: SHX5780

## 2020-05-27 HISTORY — PX: ESOPHAGOGASTRODUODENOSCOPY (EGD) WITH PROPOFOL: SHX5813

## 2020-05-27 SURGERY — COLONOSCOPY WITH PROPOFOL
Anesthesia: General

## 2020-05-27 MED ORDER — PROPOFOL 10 MG/ML IV BOLUS
INTRAVENOUS | Status: DC | PRN
  Administered 2020-05-27: 100 mg via INTRAVENOUS
  Administered 2020-05-27: 20 mg via INTRAVENOUS

## 2020-05-27 MED ORDER — LIDOCAINE VISCOUS HCL 2 % MT SOLN
OROMUCOSAL | Status: AC
  Administered 2020-05-27: 15 mL via OROMUCOSAL
  Filled 2020-05-27: qty 15

## 2020-05-27 MED ORDER — CHLORHEXIDINE GLUCONATE CLOTH 2 % EX PADS: 6.0000 | MEDICATED_PAD | Freq: Once | CUTANEOUS | Status: DC

## 2020-05-27 MED ORDER — LIDOCAINE VISCOUS HCL 2 % MT SOLN: 15.0000 mL | Freq: Once | OROMUCOSAL | Status: AC

## 2020-05-27 MED ORDER — GLYCOPYRROLATE 0.2 MG/ML IJ SOLN
INTRAMUSCULAR | Status: AC
  Administered 2020-05-27: 0.2 mg via INTRAVENOUS
  Filled 2020-05-27: qty 1

## 2020-05-27 MED ORDER — LACTATED RINGERS IV SOLN: INTRAVENOUS | Status: DC

## 2020-05-27 MED ORDER — PROPOFOL 500 MG/50ML IV EMUL
INTRAVENOUS | Status: DC | PRN
  Administered 2020-05-27: 150 ug/kg/min via INTRAVENOUS

## 2020-05-27 MED ORDER — GLYCOPYRROLATE 0.2 MG/ML IJ SOLN: 0.2000 mg | Freq: Once | INTRAMUSCULAR | Status: AC

## 2020-05-27 MED ORDER — LIDOCAINE HCL (CARDIAC) PF 100 MG/5ML IV SOSY
PREFILLED_SYRINGE | INTRAVENOUS | Status: DC | PRN
  Administered 2020-05-27: 100 mg via INTRAVENOUS

## 2020-05-27 NOTE — Discharge Instructions (Signed)
Colonoscopy Discharge Instructions  Read the instructions outlined below and refer to this sheet in the next few weeks. These discharge instructions provide you with general information on caring for yourself after you leave the hospital. Your doctor may also give you specific instructions. While your treatment has been planned according to the most current medical practices available, unavoidable complications occasionally occur. If you have any problems or questions after discharge, call Dr. Gala Romney at [phone]. ACTIVITY  You may resume your regular activity, but move at a slower pace for the next 24 hours.   Take frequent rest periods for the next 24 hours.   Walking will help get rid of the air and reduce the bloated feeling in your belly (abdomen).   No driving for 24 hours (because of the medicine (anesthesia) used during the test).    Do not sign any important legal documents or operate any machinery for 24 hours (because of the anesthesia used during the test).  NUTRITION  Drink plenty of fluids.   You may resume your normal diet as instructed by your doctor.   Begin with a light meal and progress to your normal diet. Heavy or fried foods are harder to digest and may make you feel sick to your stomach (nauseated).   Avoid alcoholic beverages for 24 hours or as instructed.  MEDICATIONS  You may resume your normal medications unless your doctor tells you otherwise.  WHAT YOU CAN EXPECT TODAY  Some feelings of bloating in the abdomen.   Passage of more gas than usual.   Spotting of blood in your stool or on the toilet paper.  IF YOU HAD POLYPS REMOVED DURING THE COLONOSCOPY:  No aspirin products for 7 days or as instructed.   No alcohol for 7 days or as instructed.   Eat a soft diet for the next 24 hours.  FINDING OUT THE RESULTS OF YOUR TEST Not all test results are available during your visit. If your test results are not back during the visit, make an appointment  with your caregiver to find out the results. Do not assume everything is normal if you have not heard from your caregiver or the medical facility. It is important for you to follow up on all of your test results.  SEEK IMMEDIATE MEDICAL ATTENTION IF:  You have more than a spotting of blood in your stool.   Your belly is swollen (abdominal distention).   You are nauseated or vomiting.   You have a temperature over 101.   You have abdominal pain or discomfort that is severe or gets worse throughout the day.   EGD Discharge instructions Please read the instructions outlined below and refer to this sheet in the next few weeks. These discharge instructions provide you with general information on caring for yourself after you leave the hospital. Your doctor may also give you specific instructions. While your treatment has been planned according to the most current medical practices available, unavoidable complications occasionally occur. If you have any problems or questions after discharge, please call your doctor. ACTIVITY  You may resume your regular activity but move at a slower pace for the next 24 hours.   Take frequent rest periods for the next 24 hours.   Walking will help expel (get rid of) the air and reduce the bloated feeling in your abdomen.   No driving for 24 hours (because of the anesthesia (medicine) used during the test).   You may shower.   Do not sign any  important legal documents or operate any machinery for 24 hours (because of the anesthesia used during the test).  NUTRITION  Drink plenty of fluids.   You may resume your normal diet.   Begin with a light meal and progress to your normal diet.   Avoid alcoholic beverages for 24 hours or as instructed by your caregiver.  MEDICATIONS  You may resume your normal medications unless your caregiver tells you otherwise.  WHAT YOU CAN EXPECT TODAY  You may experience abdominal discomfort such as a feeling of  fullness or "gas" pains.  FOLLOW-UP  Your doctor will discuss the results of your test with you.  SEEK IMMEDIATE MEDICAL ATTENTION IF ANY OF THE FOLLOWING OCCUR:  Excessive nausea (feeling sick to your stomach) and/or vomiting.   Severe abdominal pain and distention (swelling).   Trouble swallowing.   Temperature over 101 F (37.8 C).   Rectal bleeding or vomiting of blood.   Your colon looked good today.  Recommend repeat examination in 10 years for screening  Your stomach appeared abnormal.  Biopsies were taken.  Office visit with Roseanne Kaufman in 4 weeks  Recommendations to follow pending review of pathology report  At patient request, I called Mickel Baas at [phone]

## 2020-05-27 NOTE — Op Note (Signed)
[facility] Patient Name: Jerome [name] Procedure Date: 05/27/2020 9:50 AM MRN: [id number] Date of Birth: 1961/01/19 Attending MD: Norvel Richards , MD CSN: [id number] Age: 59 Admit Type: Outpatient Procedure:                Upper GI endoscopy Indications:              Iron deficiency anemia (negative colonoscopy) Providers:                Norvel Richards, MD, Otis Peak B. Gwenlyn Perking Therapist, sports, RN,                            Suzan Garibaldi. Risa Grill, Technician, Aram Candela Referring MD:              Medicines:                Propofol per Anesthesia Complications:            No immediate complications. Estimated Blood Loss:     Estimated blood loss was minimal. Procedure:                Pre-Anesthesia Assessment:                           - Prior to the procedure, a History and Physical                            was performed, and patient medications and                            allergies were reviewed. The patient's tolerance of                            previous anesthesia was also reviewed. The risks                            and benefits of the procedure and the sedation                            options and risks were discussed with the patient.                            All questions were answered, and informed consent                            was obtained. Prior Anticoagulants: The patient has                            taken no previous anticoagulant or antiplatelet                            agents. ASA Grade Assessment: II - A patient with                            mild systemic disease. After reviewing the risks  and benefits, the patient was deemed in                            satisfactory condition to undergo the procedure.                           After obtaining informed consent, the endoscope was                            passed under direct vision. Throughout the                            procedure, the patient's blood pressure,  pulse, and                            oxygen saturations were monitored continuously. The                            [id number]-[id number] ([id number]) scope was introduced through the                            mouth, and advanced to the second part of duodenum.                            The upper GI endoscopy was accomplished without                            difficulty. The patient tolerated the procedure                            well. Scope In: 10:39:06 AM Scope Out: 10:46:39 AM Total Procedure Duration: 0 hours 7 minutes 33 seconds  Findings:      Serpinginous submucosal appearing blood vessels circumferentially       involving the distal 5 to 6 cm of the tubular esophagus consistent with       grade 1-2 esophageal varices. No bleeding stigmata. 2 cm irregular       tongue of salmon-colored epithelium coming up above the GE junction       suspicious for short segment Barrett's esophagus. No nodularity. No       esophagitis.      Moderate portal hypertensive gastropathy changes were found in the       entire examined stomach. Varices, ulcer or infiltrating process       observed. Gastric mucosa moderately congested. Gastric cavity is       appeared somewhat "tubular"/narrowed consistent with history of prior       gastroplasty. See photos. Pylorus patent. This was biopsied with a cold       forceps for histology. Estimated blood loss was minimal.      The duodenal bulb and second portion of the duodenum were normal. Impression:               - Esophageal varices. Abnormal distal esophagus                            suspicious for short segment Barrett's - status  post biopsy                           - Portal hypertensive gastropathy. Surgically                            altered stomach. Biopsied.                           - Normal duodenal bulb and second portion of the                            duodenum. Patient could easily be likely bleeding                             from his stomach. Moderate Sedation:      Moderate (conscious) sedation was personally administered by an       anesthesia professional. The following parameters were monitored: oxygen       saturation, heart rate, blood pressure, respiratory rate, EKG, adequacy       of pulmonary ventilation, and response to care. Recommendation:           - Patient has a contact number available for                            emergencies. The signs and symptoms of potential                            delayed complications were discussed with the                            patient. Return to normal activities tomorrow.                            Written discharge instructions were provided to the                            patient.                           - Advance diet as tolerated.                           - Continue present medications. Follow-up on                            pathology. See colonoscopy report.                           - Return to my office in 4 weeks. Procedure Code(s):        --- Professional ---                           [phone], Esophagogastroduodenoscopy, flexible,                            transoral; with biopsy,  single or multiple Diagnosis Code(s):        --- Professional ---                           I85.00, Esophageal varices without bleeding                           K76.6, Portal hypertension                           K31.89, Other diseases of stomach and duodenum                           D50.9, Iron deficiency anemia, unspecified CPT copyright 2019 American Medical Association. All rights reserved. The codes documented in this report are preliminary and upon coder review may  be revised to meet current compliance requirements. [name]. Sudais Banghart, MD Norvel Richards, MD 05/27/2020 11:03:37 AM This report has been signed electronically. Number of Addenda: 0

## 2020-05-27 NOTE — Op Note (Signed)
[facility] Patient Name: Jerome [name] Procedure Date: 05/27/2020 10:13 AM MRN: [id number] Date of Birth: 1961-06-21 Attending MD: Norvel Richards , MD CSN: [id number] Age: 59 Admit Type: Outpatient Procedure:                Colonoscopy Indications:              Iron deficiency anemia Providers:                Norvel Richards, MD, Jeanann Lewandowsky. Gwenlyn Perking Therapist, sports, RN,                            Suzan Garibaldi. Risa Grill, Technician, Aram Candela Referring MD:              Medicines:                Propofol per Anesthesia Complications:            No immediate complications. Estimated Blood Loss:     Estimated blood loss: none. Procedure:                Pre-Anesthesia Assessment:                           - Prior to the procedure, a History and Physical                            was performed, and patient medications and                            allergies were reviewed. The patient's tolerance of                            previous anesthesia was also reviewed. The risks                            and benefits of the procedure and the sedation                            options and risks were discussed with the patient.                            All questions were answered, and informed consent                            was obtained. Prior Anticoagulants: The patient has                            taken no previous anticoagulant or antiplatelet                            agents. ASA Grade Assessment: II - A patient with                            mild systemic disease. After reviewing the risks  and benefits, the patient was deemed in                            satisfactory condition to undergo the procedure.                           After obtaining informed consent, the colonoscope                            was passed under direct vision. Throughout the                            procedure, the patient's blood pressure, pulse, and                             oxygen saturations were monitored continuously. The                            CF-[id number] ([id number]) scope was introduced through                            the anus and advanced to the the cecum, identified                            by appendiceal orifice and ileocecal valve. The                            colonoscopy was performed without difficulty. The                            patient tolerated the procedure well. The quality                            of the bowel preparation was adequate. Scope In: 10:20:14 AM Scope Out: 10:35:32 AM Scope Withdrawal Time: 0 hours 9 minutes 18 seconds  Total Procedure Duration: 0 hours 15 minutes 18 seconds  Findings:      The perianal and digital rectal examinations were normal.      The entire examined colon appeared normal on direct and retroflexion       views. Impression:               - The entire examined colon is normal on direct and                            retroflexion views.                           - No specimens collected. See EGD report. Moderate Sedation:      Moderate (conscious) sedation was personally administered by an       anesthesia professional. The following parameters were monitored: oxygen       saturation, heart rate, blood pressure, respiratory rate, EKG, adequacy       of pulmonary ventilation, and response to care. Recommendation:           - Repeat colonoscopy in  10 years for screening                            purposes.                           - Return to GI office (date not yet determined). Procedure Code(s):        --- Professional ---                           [phone], Colonoscopy, flexible; diagnostic, including                            collection of specimen(s) by brushing or washing,                            when performed (separate procedure) Diagnosis Code(s):        --- Professional ---                           D50.9, Iron deficiency anemia, unspecified CPT copyright 2019 American Medical  Association. All rights reserved. The codes documented in this report are preliminary and upon coder review may  be revised to meet current compliance requirements. [name]. Nalia Honeycutt, MD Norvel Richards, MD 05/27/2020 10:56:48 AM This report has been signed electronically. Number of Addenda: 0

## 2020-05-27 NOTE — Transfer of Care (Signed)
Immediate Anesthesia Transfer of Care Note  Patient: Jerome [name].  Procedure(s) Performed: COLONOSCOPY WITH PROPOFOL (N/A ) ESOPHAGOGASTRODUODENOSCOPY (EGD) WITH PROPOFOL (N/A )  Patient Location: PACU  Anesthesia Type:General  Level of Consciousness: Drowsy  Airway & Oxygen Therapy: Patient Spontanous Breathing  Post-op Assessment: Report given to RN and Post -op Vital signs reviewed and stable  Post vital signs: Reviewed and stable  Last Vitals:  Vitals Value Taken Time  BP    Temp    Pulse    Resp    SpO2      Last Pain:  Vitals:   05/27/20 1014  TempSrc:   PainSc: 6       Patients Stated Pain Goal: 10 (21/03/12 8118)  Complications: No complications documented.

## 2020-05-27 NOTE — H&P (Signed)
@LOGO @   Primary Care Physician:  Redmond School, MD Primary Gastroenterologist:  Dr.   Pre-Procedure History & Physical: HPI:  Jerome [name]. is a 59 y.o. male here for further evaluation of iron deficiency anemia and recent rectal bleeding.  Negative colonoscopy 2014.  Past Medical History:  Diagnosis Date  . Anxiety   . DJD (degenerative joint disease)   . H/O cardiovascular stress test 2013  . H/O echocardiogram 2012   for cad, htn & obesity  . History of cardiac catheterization    a. normal cors by cath in 2011  . Hypertension   . Obesity   . Spinal stenosis   . Thrombocytopenia ([place])     Past Surgical History:  Procedure Laterality Date  . BACK SURGERY     x 4  . BREAST REDUCTION SURGERY    . CARDIAC CATHETERIZATION  2011   5 frech sheath  . COLONOSCOPY N/A 10/14/2012   YIF:OYDXAJ rectum and colon  . CYST EXCISION Right 08/08/2016   Procedure: EXCISION OF RIGHT INGUINAL SUBCUTANEOUS MASS (LIKELY SEBACEIYS CYST);  Surgeon: Vickie Epley, MD;  Location: [facility];  Service: General;  Laterality: Right;  7cm x 4cm mass; dermabond dressing  . FLEXIBLE SIGMOIDOSCOPY N/A 10/15/2013   markedly inflamed distal rectum with ulceration, rectal polyp benign. Acute ulceration of rectum.   . h/o cardiac cath    . LAPAROSCOPIC GASTRIC SLEEVE RESECTION  06/2012   Dr Mickie Bail [facility])    Prior to Admission medications   Medication Sig Start Date End Date Taking? Authorizing Provider  acetaminophen (TYLENOL) 500 MG tablet Take 1,000 mg by mouth 2 (two) times daily as needed for moderate pain or headache.    Yes [provider]  ascorbic acid (VITAMIN C) 500 MG tablet Take 500 mg by mouth daily.   Yes [provider]  B COMPLEX VITAMINS SL Place 1 Dose under the tongue daily. Liquid   Yes [provider]  bisacodyl (DULCOLAX) 5 MG EC tablet Take 15 mg by mouth at bedtime.   Yes [provider]  Calcium Carb-Cholecalciferol (CALCIUM/VITAMIN D PO)  Take 1 tablet by mouth daily.   Yes [provider]  cetirizine (ZYRTEC) 10 MG tablet Take 10 mg by mouth as needed for allergies.   Yes [provider]  clonazePAM (KLONOPIN) 1 MG tablet Take 1 mg by mouth 2 (two) times daily.  04/20/14  Yes [provider]  cyclobenzaprine (FLEXERIL) 5 MG tablet Take 1 tablet (5 mg total) by mouth 3 (three) times daily as needed for muscle spasms. Only as needed. Patient taking differently: Take 5 mg by mouth at bedtime. . 06/05/19  Yes Dohmeier, Asencion Partridge, MD  docusate sodium (COLACE) 100 MG capsule Take 300 mg by mouth at bedtime.   Yes [provider]  ferrous sulfate 325 (65 FE) MG tablet Take 325 mg by mouth daily with breakfast.   Yes [provider]  Flaxseed, Linseed, (FLAX SEED OIL) 1000 MG CAPS Take 1,000 mg by mouth daily.    Yes [provider]  fluticasone (FLONASE) 50 MCG/ACT nasal spray Place 2 sprays into both nostrils daily as needed for congestion. 07/07/16  Yes [provider]  losartan (COZAAR) 25 MG tablet Take 0.5 tablets (12.5 mg total) by mouth daily. 01/02/19  Yes Strader, [place] M, PA-C  lubiprostone (AMITIZA) 24 MCG capsule TAKE 1 CAPSULE BY MOUTH TWICE DAILY WITH FOOD Patient taking differently: Take 24 mcg by mouth daily with breakfast.  05/04/20  Yes Annitta Needs, NP  Multiple Vitamin (MULTIVITAMIN WITH MINERALS) TABS tablet Take 1 tablet by mouth daily.   Yes [provider]  omeprazole (PRILOSEC) 20 MG capsule Take 20 mg by mouth every other day.  09/23/12  Yes [provider]  pravastatin (PRAVACHOL) 40 MG tablet Take 40 mg by mouth daily.   Yes [provider]  sertraline (ZOLOFT) 100 MG tablet Take 100 mg by mouth daily.  09/27/12  Yes [provider]  zinc gluconate 50 MG tablet Take 25 mg by mouth daily.    Yes [provider]  HYDROcodone-acetaminophen (NORCO/VICODIN) 5-325 MG tablet Take 1 tablet by mouth every 6 (six) hours  as needed for moderate pain.    [provider]    Allergies as of 05/04/2020 - Review Complete 05/04/2020  Allergen Reaction Noted  . Aspirin Nausea And Vomiting 03/27/2014  . Codeine Nausea And Vomiting 10/14/2012  . Nsaids Anxiety 07/27/2013    Family History  Problem Relation Age of Onset  . Heart attack Father   . Colon cancer Paternal Grandmother   . Brain cancer Brother 63    Social History   Socioeconomic History  . Marital status: Divorced    Spouse name: Not on file  . Number of children: 2  . Years of education: Not on file  . Highest education level: Not on file  Occupational History  . 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 Smoking status: Current Every Day Smoker    Packs/day: 0.25    Years: 36.00    Pack years: 9.00    Types: Cigarettes  . Smokeless tobacco: Never Used  Vaping Use  . Vaping Use: Never used  Substance and Sexual Activity  . Alcohol use: Yes    Alcohol/week: 0.0 standard drinks    Comment: occasionally   . Drug use: No  . Sexual activity: Yes    Partners: Male  Other Topics Concern  . Not on file  Social History Narrative   Moving to [place] to care for brother w/ brain tumor   Lives w/ 2 daughters & 4 grandsons   Social Determinants of Health   Financial Resource Strain:   . Difficulty of Paying Living Expenses: Not on file  Food Insecurity:   . Worried About Charity fundraiser in the Last Year: Not on file  . Ran Out of Food in the Last Year: Not on file  Transportation Needs:   . Lack of Transportation (Medical): Not on file  . Lack of Transportation (Non-Medical): Not on file  Physical Activity:   . Days of Exercise per Week: Not on file  . Minutes of Exercise per Session: Not on file  Stress:   . Feeling of Stress : Not on file  Social Connections:   . Frequency of Communication with Friends and Family: Not on file  . Frequency of Social Gatherings with Friends and Family: Not on  file  . Attends Religious Services: Not on file  . Active Member of Clubs or Organizations: Not on file  . Attends Archivist Meetings: Not on file  . Marital Status: Not on file  Intimate Partner Violence:   . Fear of Current or Ex-Partner: Not on file  . Emotionally Abused: Not on file  . Physically Abused: Not on file  . Sexually Abused: Not on file    Review of Systems: See HPI, otherwise negative ROS  Physical Exam: BP 113/76  Pulse 69   Temp 98.9 F (37.2 C) (Oral)   Resp (!) 22   SpO2 99%  General:   Alert,   pleasant and cooperative in NAD Neck:  Supple; no masses or thyromegaly. No significant cervical adenopathy. Lungs:  Clear throughout to auscultation.   No wheezes, crackles, or rhonchi. No acute distress. Heart:  Regular rate and rhythm; no murmurs, clicks, rubs,  or gallops. Abdomen: Non-distended, normal bowel sounds.  Soft and nontender without appreciable mass or hepatosplenomegaly.  Pulses:  Normal pulses noted. Extremities:  Without clubbing or edema.  Impression/Plan: 59 year old gentleman with iron deficiency anemia low-volume rectal bleeding. Plan for diagnostic colonoscopy today with possible EGD to follow.  Dates GERD symptoms well controlled on behavioral modification measures alone.  Not taking omeprazole. The risks, benefits, limitations, imponderables and alternatives regarding both EGD and colonoscopy have been reviewed with the patient. Questions have been answered. All parties agreeable.      Notice: This dictation was prepared with Dragon dictation along with smaller phrase technology. Any transcriptional errors that result from this process are unintentional and may not be corrected upon review.

## 2020-05-27 NOTE — Anesthesia Preprocedure Evaluation (Signed)
Anesthesia Evaluation  Patient identified by MRN, date of birth, ID band Patient awake    Reviewed: Allergy & Precautions, H&P , NPO status , Patient's Chart, lab work & pertinent test results, reviewed documented beta blocker date and time   Airway Mallampati: II  TM Distance: >3 FB Neck ROM: full    Dental no notable dental hx. (+) Teeth Intact   Pulmonary sleep apnea , Current Smoker,    Pulmonary exam normal breath sounds clear to auscultation       Cardiovascular Exercise Tolerance: Good hypertension, negative cardio ROS   Rhythm:regular Rate:Normal     Neuro/Psych  Headaches, PSYCHIATRIC DISORDERS Anxiety    GI/Hepatic negative GI ROS, Neg liver ROS,   Endo/Other  negative endocrine ROS  Renal/GU negative Renal ROS  negative genitourinary   Musculoskeletal   Abdominal   Peds  Hematology  (+) Blood dyscrasia, anemia ,   Anesthesia Other Findings   Reproductive/Obstetrics negative OB ROS                             Anesthesia Physical Anesthesia Plan  ASA: II  Anesthesia Plan: General   Post-op Pain Management:    Induction:   PONV Risk Score and Plan: Propofol infusion  Airway Management Planned:   Additional Equipment:   Intra-op Plan:   Post-operative Plan:   Informed Consent: I have reviewed the patients History and Physical, chart, labs and discussed the procedure including the risks, benefits and alternatives for the proposed anesthesia with the patient or authorized representative who has indicated his/her understanding and acceptance.     Dental Advisory Given  Plan Discussed with: CRNA  Anesthesia Plan Comments:         Anesthesia Quick Evaluation

## 2020-05-27 NOTE — Anesthesia Postprocedure Evaluation (Signed)
Anesthesia Post Note  Patient: Jerome [name].  Procedure(s) Performed: COLONOSCOPY WITH PROPOFOL (N/A ) ESOPHAGOGASTRODUODENOSCOPY (EGD) WITH PROPOFOL (N/A )  Patient location during evaluation: PACU Anesthesia Type: General Level of consciousness: awake and awake and alert Pain management: satisfactory to patient Vital Signs Assessment: post-procedure vital signs reviewed and stable Respiratory status: spontaneous breathing Cardiovascular status: blood pressure returned to baseline Postop Assessment: no apparent nausea or vomiting Anesthetic complications: no   No complications documented.   Last Vitals:  Vitals:   05/27/20 0928 05/27/20 1050  BP: 113/76 105/72  Pulse: 69   Resp: (!) 22 (!) 24  Temp: 37.2 C 36.7 C  SpO2: 99% 98%    Last Pain:  Vitals:   05/27/20 1050  TempSrc: Axillary  PainSc:                  Karna Dupes

## 2020-05-28 ENCOUNTER — Encounter: Payer: Self-pay | Admitting: Internal Medicine

## 2020-05-28 ENCOUNTER — Other Ambulatory Visit: Payer: Self-pay

## 2020-05-28 LAB — SURGICAL PATHOLOGY

## 2020-06-02 ENCOUNTER — Encounter (HOSPITAL_COMMUNITY): Payer: Self-pay | Admitting: Internal Medicine

## 2020-06-04 ENCOUNTER — Ambulatory Visit: Payer: Medicare HMO | Admitting: Gastroenterology

## 2020-06-04 ENCOUNTER — Ambulatory Visit (HOSPITAL_COMMUNITY)
Admission: RE | Admit: 2020-06-04 | Discharge: 2020-06-04 | Disposition: A | Discharge status: Home / Self Care | Payer: Medicare HMO | Source: Ambulatory Visit | Attending: Gastroenterology | Admitting: Gastroenterology

## 2020-06-04 ENCOUNTER — Other Ambulatory Visit: Payer: Self-pay

## 2020-06-04 DIAGNOSIS — D179 Benign lipomatous neoplasm, unspecified: Secondary | ICD-10-CM | POA: Diagnosis not present

## 2020-06-04 DIAGNOSIS — N2889 Other specified disorders of kidney and ureter: Secondary | ICD-10-CM | POA: Diagnosis not present

## 2020-06-04 DIAGNOSIS — N281 Cyst of kidney, acquired: Secondary | ICD-10-CM | POA: Diagnosis not present

## 2020-06-04 DIAGNOSIS — K8020 Calculus of gallbladder without cholecystitis without obstruction: Secondary | ICD-10-CM | POA: Diagnosis not present

## 2020-06-04 LAB — POCT I-STAT CREATININE: Creatinine, Ser: 0.8 mg/dL (ref 0.61–1.24)

## 2020-06-04 MED ORDER — IOHEXOL 300 MG/ML  SOLN
100.0000 mL | Freq: Once | INTRAMUSCULAR | Status: AC | PRN
  Administered 2020-06-04: 100 mL via INTRAVENOUS

## 2020-06-09 ENCOUNTER — Telehealth: Payer: Self-pay | Admitting: Gastroenterology

## 2020-06-09 NOTE — Telephone Encounter (Signed)
done

## 2020-06-09 NOTE — Telephone Encounter (Signed)
Manuela Schwartz:  I sent a result note on CT to send to [facility]. However, the NP is no longer there. Can we send it attention Dr. Mariam Dollar?  Thanks!

## 2020-06-10 ENCOUNTER — Encounter: Payer: Self-pay | Admitting: Adult Health

## 2020-06-10 ENCOUNTER — Ambulatory Visit: Payer: Self-pay | Admitting: Adult Health

## 2020-06-10 ENCOUNTER — Ambulatory Visit: Payer: Medicare HMO | Admitting: Adult Health

## 2020-06-10 VITALS — BP 126/84 | HR 84 | Ht 72.0 in | Wt 206.6 lb

## 2020-06-10 DIAGNOSIS — Z9989 Dependence on other enabling machines and devices: Secondary | ICD-10-CM

## 2020-06-10 DIAGNOSIS — G4733 Obstructive sleep apnea (adult) (pediatric): Secondary | ICD-10-CM

## 2020-06-10 NOTE — Progress Notes (Signed)
Order for mask refitting- phillips dream wear full face sent to [facility] via community msg. Confirmation received that the order transmitted was successful.

## 2020-06-10 NOTE — Progress Notes (Signed)
PATIENT: Jerome [name]. DOB: [birth year]  REASON FOR VISIT: follow up HISTORY FROM: patient  HISTORY OF PRESENT ILLNESS: Today 06/10/20:  Jerome [name] is a 59 year old male with a history of obstructive sleep apnea on CPAP.  His download indicates that he uses machine 27 out of 30 days for compliance of 90%.  He uses machine greater than 4 hours for compliance of 73.3%.  On average he uses his machine 6 hours and 50 minutes.  His residual AHI is 5.4 on 6 to 16 cm of water.  He reports that he has noticed the biggest change since he started using CPAP.  He is having a hard time using a full facemask.  He states that it rubs a spot on the top of his nose.  He returns today for follow-up.  He does note that he just got machine October 20 so we are not quite at 31 days yet for his initial CPAP visit.  HISTORY 12/04/19:  Jerome [name] is a 59 year old male with a history of obstructive sleep apnea.  He returns today for follow-up.  He states that he had a sleep study in 2017 that revealed he no longer has sleep apnea.  He reports that he is having the same symptoms he reported when he was diagnosed with sleep apnea.  Reports that he is tired throughout the day.  Reports that he laid down 1 day to take a nap and woke up several hours later.  Reports he also wakes up with a dry and sore throat.  Reports that there are nights that he has woken himself up making weird noises.  He is managed by [facility] for chronic back pain.  Reports that he is not on daily opioids.  Reports that he has hydrocodone but has only taken 1 pill since March 2021 he returns today for an evaluation.  REVIEW OF SYSTEMS: Out of a complete 14 system review of symptoms, the patient complains only of the following symptoms, and all other reviewed systems are negative.  FSS 47--due to chronic pain ESS 8  ALLERGIES: Allergies  Allergen Reactions  . Aspirin Nausea And Vomiting    Due to gastric sleeve surgery   .  Codeine Nausea And Vomiting  . Nsaids Anxiety    Due to gastric sleeve surgery  N/V    HOME MEDICATIONS: Outpatient Medications Prior to Visit  Medication Sig Dispense Refill  . acetaminophen (TYLENOL) 500 MG tablet Take 1,000 mg by mouth 2 (two) times daily as needed for moderate pain or headache.     Marland Kitchen ascorbic acid (VITAMIN C) 500 MG tablet Take 500 mg by mouth daily.    . B COMPLEX VITAMINS SL Place 1 Dose under the tongue daily. Liquid    . bisacodyl (DULCOLAX) 5 MG EC tablet Take 15 mg by mouth at bedtime.    . Calcium Carb-Cholecalciferol (CALCIUM/VITAMIN D PO) Take 1 tablet by mouth daily.    . cetirizine (ZYRTEC) 10 MG tablet Take 10 mg by mouth as needed for allergies.    . clonazePAM (KLONOPIN) 1 MG tablet Take 1 mg by mouth 2 (two) times daily.     . cyclobenzaprine (FLEXERIL) 5 MG tablet Take 1 tablet (5 mg total) by mouth 3 (three) times daily as needed for muscle spasms. Only as needed. (Patient taking differently: Take 5 mg by mouth at bedtime. Marland Kitchen) 30 tablet 1  . docusate sodium (COLACE) 100 MG capsule Take 300 mg by mouth at bedtime.    Marland Kitchen  ferrous sulfate 325 (65 FE) MG tablet Take 325 mg by mouth daily with breakfast.    . Flaxseed, Linseed, (FLAX SEED OIL) 1000 MG CAPS Take 1,000 mg by mouth daily.     . fluticasone (FLONASE) 50 MCG/ACT nasal spray Place 2 sprays into both nostrils daily as needed for congestion.  5  . HYDROcodone-acetaminophen (NORCO/VICODIN) 5-325 MG tablet Take 1 tablet by mouth every 6 (six) hours as needed for moderate pain.    Marland Kitchen losartan (COZAAR) 25 MG tablet Take 0.5 tablets (12.5 mg total) by mouth daily. 45 tablet 3  . lubiprostone (AMITIZA) 24 MCG capsule TAKE 1 CAPSULE BY MOUTH TWICE DAILY WITH FOOD (Patient taking differently: Take 24 mcg by mouth daily with breakfast. ) 60 capsule 5  . Multiple Vitamin (MULTIVITAMIN WITH MINERALS) TABS tablet Take 1 tablet by mouth daily.    Marland Kitchen omeprazole (PRILOSEC) 20 MG capsule Take 20 mg by mouth every other  day.     . pravastatin (PRAVACHOL) 40 MG tablet Take 40 mg by mouth daily.    . sertraline (ZOLOFT) 100 MG tablet Take 100 mg by mouth daily.     Marland Kitchen zinc gluconate 50 MG tablet Take 25 mg by mouth daily.      No facility-administered medications prior to visit.    PAST MEDICAL HISTORY: Past Medical History:  Diagnosis Date  . Anxiety   . DJD (degenerative joint disease)   . H/O cardiovascular stress test 2013  . H/O echocardiogram 2012   for cad, htn & obesity  . History of cardiac catheterization    a. normal cors by cath in 2011  . Hypertension   . Obesity   . Spinal stenosis   . Thrombocytopenia ([place])     PAST SURGICAL HISTORY: Past Surgical History:  Procedure Laterality Date  . BACK SURGERY     x 4  . BIOPSY  05/27/2020   Procedure: BIOPSY;  Surgeon: Daneil Dolin, MD;  Location: [facility];  Service: Endoscopy;;  . BREAST REDUCTION SURGERY    . CARDIAC CATHETERIZATION  2011   5 frech sheath  . COLONOSCOPY N/A 10/14/2012   ZOX:[id number] rectum and colon  . COLONOSCOPY WITH PROPOFOL N/A 05/27/2020   Procedure: COLONOSCOPY WITH PROPOFOL;  Surgeon: Daneil Dolin, MD;  Location: [facility];  Service: Endoscopy;  Laterality: N/A;  10:15am  . CYST EXCISION Right 08/08/2016   Procedure: EXCISION OF RIGHT INGUINAL SUBCUTANEOUS MASS (LIKELY SEBACEIYS CYST);  Surgeon: Vickie Epley, MD;  Location: [facility];  Service: General;  Laterality: Right;  7cm x 4cm mass; dermabond dressing  . ESOPHAGOGASTRODUODENOSCOPY (EGD) WITH PROPOFOL N/A 05/27/2020   Procedure: ESOPHAGOGASTRODUODENOSCOPY (EGD) WITH PROPOFOL;  Surgeon: Daneil Dolin, MD;  Location: [facility];  Service: Endoscopy;  Laterality: N/A;  . FLEXIBLE SIGMOIDOSCOPY N/A 10/15/2013   markedly inflamed distal rectum with ulceration, rectal polyp benign. Acute ulceration of rectum.   . h/o cardiac cath    . LAPAROSCOPIC GASTRIC SLEEVE RESECTION  06/2012   Dr Mickie Bail [facility])    FAMILY HISTORY: Family History    Problem Relation Age of Onset  . Heart attack Father   . Colon cancer Paternal Grandmother   . Brain cancer Brother 20    SOCIAL HISTORY: Social History   Socioeconomic History  . Marital status: Divorced    Spouse name: Not on file  . Number of children: 2  . Years of education: Not on file  . Highest education level: Not on file  Occupational History  . 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 Smoking status: Current Every Day Smoker    Packs/day: 0.25    Years: 36.00    Pack years: 9.00    Types: Cigarettes  . Smokeless tobacco: Never Used  Vaping Use  . Vaping Use: Never used  Substance and Sexual Activity  . Alcohol use: Yes    Alcohol/week: 0.0 standard drinks    Comment: occasionally   . Drug use: No  . Sexual activity: Yes    Partners: Male  Other Topics Concern  . Not on file  Social History Narrative   Moving to [place] to care for brother w/ brain tumor   Lives w/ 2 daughters & 4 grandsons   Social Determinants of Health   Financial Resource Strain:   . Difficulty of Paying Living Expenses: Not on file  Food Insecurity:   . Worried About Charity fundraiser in the Last Year: Not on file  . Ran Out of Food in the Last Year: Not on file  Transportation Needs:   . Lack of Transportation (Medical): Not on file  . Lack of Transportation (Non-Medical): Not on file  Physical Activity:   . Days of Exercise per Week: Not on file  . Minutes of Exercise per Session: Not on file  Stress:   . Feeling of Stress : Not on file  Social Connections:   . Frequency of Communication with Friends and Family: Not on file  . Frequency of Social Gatherings with Friends and Family: Not on file  . Attends Religious Services: Not on file  . Active Member of Clubs or Organizations: Not on file  . Attends Archivist Meetings: Not on file  . Marital Status: Not on file  Intimate Partner Violence:   . Fear of Current or Ex-Partner: Not on  file  . Emotionally Abused: Not on file  . Physically Abused: Not on file  . Sexually Abused: Not on file      PHYSICAL EXAM  Vitals:   06/10/20 1357  BP: 126/84  Pulse: 84  Weight: 206 lb 9.6 oz (93.7 kg)  Height: 6' (1.829 m)   Body mass index is 28.02 kg/m.  Generalized: Well developed, in no acute distress  Chest: Lungs clear to auscultation bilaterally  Neurological examination  Mentation: Alert oriented to time, place, history taking. Follows all commands speech and language fluent Cranial nerve II-XII: Extraocular movements were full, visual field were full on confrontational test Head turning and shoulder shrug  were normal and symmetric. Motor: The motor testing reveals 5 over 5 strength of all 4 extremities. Good symmetric motor tone is noted throughout.  Sensory: Sensory testing is intact to soft touch on all 4 extremities. No evidence of extinction is noted.  Gait and station: Gait is normal.    DIAGNOSTIC DATA (LABS, IMAGING, TESTING) - I reviewed patient records, labs, notes, testing and imaging myself where available.  Lab Results  Component Value Date   WBC 5.1 08/08/2016   HGB 15.6 08/08/2016   HCT 47.4 08/08/2016   MCV 84.6 08/08/2016   PLT 119 (L) 08/08/2016      Component Value Date/Time   NA 139 01/03/2016 1548   K 4.6 01/03/2016 1548   CL 103 01/03/2016 1548   CO2 28 01/03/2016 1548   GLUCOSE 73 01/03/2016 1548   BUN 17 01/03/2016 1548   CREATININE 0.80 06/04/2020 1236   CREATININE 0.73 01/03/2016 1548  CALCIUM 9.2 01/03/2016 1548   PROT 7.5 07/27/2013 1128   ALBUMIN 4.4 07/27/2013 1128   AST 20 07/27/2013 1128   ALT 21 07/27/2013 1128   ALKPHOS 87 07/27/2013 1128   BILITOT 0.8 07/27/2013 1128   GFRNONAA >90 07/27/2013 1128   GFRAA >90 07/27/2013 1128    Lab Results  Component Value Date   TSH 1.193 01/27/2019      ASSESSMENT AND PLAN 59 y.o. year old male  has a past medical history of Anxiety, DJD (degenerative joint  disease), H/O cardiovascular stress test (2013), H/O echocardiogram (2012), History of cardiac catheterization, Hypertension, Obesity, Spinal stenosis, and Thrombocytopenia ([place]). here with:  1. OSA on CPAP  - CPAP compliance excellent - Good treatment of AHI -Mask refitting reordered try [employer] DreamWear fullface - Encourage patient to use CPAP nightly and > 4 hours each night -Patient will follow up in 2 to 3 weeks to meet the requirements for initial CPAP visit.   I spent 20 minutes of face-to-face and non-face-to-face time with patient.  This included previsit chart review, lab review, study review, order entry, electronic health record documentation, patient education.  Ward Givens, MSN, NP-C 06/10/2020, 2:17 PM [facility] [address] [place], [place] [address] [phone]

## 2020-06-10 NOTE — Patient Instructions (Signed)
Continue using CPAP nightly and greater than 4 hours each night.  °Mask refitting °If your symptoms worsen or you develop new symptoms please let us know.  ° °

## 2020-06-12 DIAGNOSIS — G4733 Obstructive sleep apnea (adult) (pediatric): Secondary | ICD-10-CM | POA: Diagnosis not present

## 2020-06-21 NOTE — Progress Notes (Signed)
PATIENT: Jerome [name]. DOB: 07/09/1961  REASON FOR VISIT: follow up HISTORY FROM: patient  Virtual Visit via Video Note  I connected with Jerome [name]. on 06/21/20 at  9:00 AM EST by a video enabled telemedicine application located remotely at [facility] and verified that I am speaking with the correct person using two identifiers who was located at their own home.   I discussed the limitations of evaluation and management by telemedicine and the availability of in person appointments. The patient expressed understanding and agreed to proceed.   PATIENT: Jerome [name]. DOB: Jul 13, 1961  REASON FOR VISIT: follow up HISTORY FROM: patient  HISTORY OF PRESENT ILLNESS: Today 06/21/20:  Jerome [name] is a 59 year old male with a history of OSA on CPAP.  His download indicates that he uses his machine 29 out of 30 days for compliance of 93.5%.  He uses machine greater than 4 hours for compliance of 83.9%.  On average he uses his machine 6 hours and 26 minutes.  His residual AHI is 5.5 on 6 to 16 cm of water with EPR 3.  He reports that the CPAP is working well for him.  He has noticed a significant difference in how he feels.  He is not happy with the full facemask.  He states that it makes the bridge of his nose very sore.  He is currently putting Band-Aids across his nose to add more comfort.  He returns today for virtual visit  HISTORY 06/10/20:  Jerome [name] is a 59 year old male with a history of obstructive sleep apnea on CPAP.  His download indicates that he uses machine 27 out of 30 days for compliance of 90%.  He uses machine greater than 4 hours for compliance of 73.3%.  On average he uses his machine 6 hours and 50 minutes.  His residual AHI is 5.4 on 6 to 16 cm of water.  He reports that he has noticed the biggest change since he started using CPAP.  He is having a hard time using a full facemask.  He states that it rubs a spot on the top of his nose.   He returns today for follow-up.  He does note that he just got machine October 20 so we are not quite at 31 days yet for his initial CPAP visit.  REVIEW OF SYSTEMS: Out of a complete 14 system review of symptoms, the patient complains only of the following symptoms, and all other reviewed systems are negative.  ALLERGIES: Allergies  Allergen Reactions  . Aspirin Nausea And Vomiting    Due to gastric sleeve surgery   . Codeine Nausea And Vomiting  . Nsaids Anxiety    Due to gastric sleeve surgery  N/V    HOME MEDICATIONS: Outpatient Medications Prior to Visit  Medication Sig Dispense Refill  . acetaminophen (TYLENOL) 500 MG tablet Take 1,000 mg by mouth 2 (two) times daily as needed for moderate pain or headache.     Marland Kitchen ascorbic acid (VITAMIN C) 500 MG tablet Take 500 mg by mouth daily.    . B COMPLEX VITAMINS SL Place 1 Dose under the tongue daily. Liquid    . bisacodyl (DULCOLAX) 5 MG EC tablet Take 15 mg by mouth at bedtime.    . Calcium Carb-Cholecalciferol (CALCIUM/VITAMIN D PO) Take 1 tablet by mouth daily.    . cetirizine (ZYRTEC) 10 MG tablet Take 10 mg by mouth as needed for allergies.    Marland Kitchen  clonazePAM (KLONOPIN) 1 MG tablet Take 1 mg by mouth 2 (two) times daily.     . cyclobenzaprine (FLEXERIL) 5 MG tablet Take 1 tablet (5 mg total) by mouth 3 (three) times daily as needed for muscle spasms. Only as needed. (Patient taking differently: Take 5 mg by mouth at bedtime. Marland Kitchen) 30 tablet 1  . docusate sodium (COLACE) 100 MG capsule Take 300 mg by mouth at bedtime.    . ferrous sulfate 325 (65 FE) MG tablet Take 325 mg by mouth daily with breakfast.    . Flaxseed, Linseed, (FLAX SEED OIL) 1000 MG CAPS Take 1,000 mg by mouth daily.     . fluticasone (FLONASE) 50 MCG/ACT nasal spray Place 2 sprays into both nostrils daily as needed for congestion.  5  . HYDROcodone-acetaminophen (NORCO/VICODIN) 5-325 MG tablet Take 1 tablet by mouth every 6 (six) hours as needed for moderate pain.    Marland Kitchen  losartan (COZAAR) 25 MG tablet Take 0.5 tablets (12.5 mg total) by mouth daily. 45 tablet 3  . lubiprostone (AMITIZA) 24 MCG capsule TAKE 1 CAPSULE BY MOUTH TWICE DAILY WITH FOOD (Patient taking differently: Take 24 mcg by mouth daily with breakfast. ) 60 capsule 5  . Multiple Vitamin (MULTIVITAMIN WITH MINERALS) TABS tablet Take 1 tablet by mouth daily.    Marland Kitchen omeprazole (PRILOSEC) 20 MG capsule Take 20 mg by mouth every other day.     . pravastatin (PRAVACHOL) 40 MG tablet Take 40 mg by mouth daily.    . sertraline (ZOLOFT) 100 MG tablet Take 100 mg by mouth daily.     Marland Kitchen zinc gluconate 50 MG tablet Take 25 mg by mouth daily.      No facility-administered medications prior to visit.    PAST MEDICAL HISTORY: Past Medical History:  Diagnosis Date  . Anxiety   . DJD (degenerative joint disease)   . H/O cardiovascular stress test 2013  . H/O echocardiogram 2012   for cad, htn & obesity  . History of cardiac catheterization    a. normal cors by cath in 2011  . Hypertension   . Obesity   . Spinal stenosis   . Thrombocytopenia ([place])     PAST SURGICAL HISTORY: Past Surgical History:  Procedure Laterality Date  . BACK SURGERY     x 4  . BIOPSY  05/27/2020   Procedure: BIOPSY;  Surgeon: Daneil Dolin, MD;  [facility];  Service: Endoscopy;;  . BREAST REDUCTION SURGERY    . CARDIAC CATHETERIZATION  2011   5 frech sheath  . COLONOSCOPY N/A 10/14/2012   [id number] rectum and colon  . COLONOSCOPY WITH PROPOFOL N/A 05/27/2020   Procedure: COLONOSCOPY WITH PROPOFOL;  Surgeon: Daneil Dolin, MD;  [facility];  Service: Endoscopy;  Laterality: N/A;  10:15am  . CYST EXCISION Right 08/08/2016   Procedure: EXCISION OF RIGHT INGUINAL SUBCUTANEOUS MASS (LIKELY SEBACEIYS CYST);  Surgeon: Vickie Epley, MD;  Location: [facility];  Service: General;  Laterality: Right;  7cm x 4cm mass; dermabond dressing  . ESOPHAGOGASTRODUODENOSCOPY (EGD) WITH PROPOFOL N/A 05/27/2020    Procedure: ESOPHAGOGASTRODUODENOSCOPY (EGD) WITH PROPOFOL;  Surgeon: Daneil Dolin, MD;  [facility];  Service: Endoscopy;  Laterality: N/A;  . FLEXIBLE SIGMOIDOSCOPY N/A 10/15/2013   markedly inflamed distal rectum with ulceration, rectal polyp benign. Acute ulceration of rectum.   . h/o cardiac cath    . LAPAROSCOPIC GASTRIC SLEEVE RESECTION  06/2012   Dr Mickie Bail [facility])  FAMILY HISTORY: Family History  Problem Relation Age of Onset  . Heart attack Father   . Colon cancer Paternal Grandmother   . Brain cancer Brother 25    SOCIAL HISTORY: Social History   Socioeconomic History  . Marital status: Divorced    Spouse name: Not on file  . Number of children: 2  . Years of education: Not on file  . Highest education level: Not on file  Occupational History  . 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 Smoking status: Current Every Day Smoker    Packs/day: 0.25    Years: 36.00    Pack years: 9.00    Types: Cigarettes  . Smokeless tobacco: Never Used  Vaping Use  . Vaping Use: Never used  Substance and Sexual Activity  . Alcohol use: Yes    Alcohol/week: 0.0 standard drinks    Comment: occasionally   . Drug use: No  . Sexual activity: Yes    Partners: Male  Other Topics Concern  . Not on file  Social History Narrative   Moving to [place] to care for brother w/ brain tumor   Lives w/ 2 daughters & 4 grandsons   Social Determinants of Health   Financial Resource Strain:   . Difficulty of Paying Living Expenses: Not on file  Food Insecurity:   . Worried About Charity fundraiser in the Last Year: Not on file  . Ran Out of Food in the Last Year: Not on file  Transportation Needs:   . Lack of Transportation (Medical): Not on file  . Lack of Transportation (Non-Medical): Not on file  Physical Activity:   . Days of Exercise per Week: Not on file  . Minutes of Exercise per Session: Not on file  Stress:   . Feeling of Stress : Not  on file  Social Connections:   . Frequency of Communication with Friends and Family: Not on file  . Frequency of Social Gatherings with Friends and Family: Not on file  . Attends Religious Services: Not on file  . Active Member of Clubs or Organizations: Not on file  . Attends Archivist Meetings: Not on file  . Marital Status: Not on file  Intimate Partner Violence:   . Fear of Current or Ex-Partner: Not on file  . Emotionally Abused: Not on file  . Physically Abused: Not on file  . Sexually Abused: Not on file      PHYSICAL EXAM Generalized: Well developed, in no acute distress   Neurological examination  Mentation: Alert oriented to time, place, history taking. Follows all commands speech and language fluent Cranial nerve II-XII:Extraocular movements were full. Facial symmetry noted. uvula tongue midline. Head turning and shoulder shrug  were normal and symmetric. Motor: Good strength throughout subjectively per patient Sensory: Sensory testing is intact to soft touch on all 4 extremities subjectively per patient Coordination: Cerebellar testing reveals good finger-nose-finger  Gait and station: Patient is able to stand from a seated position. gait is normal.  Reflexes: UTA  DIAGNOSTIC DATA (LABS, IMAGING, TESTING) - I reviewed patient records, labs, notes, testing and imaging myself where available.  Lab Results  Component Value Date   WBC 5.1 08/08/2016   HGB 15.6 08/08/2016   HCT 47.4 08/08/2016   MCV 84.6 08/08/2016   PLT 119 (L) 08/08/2016      Component Value Date/Time   NA 139 01/03/2016 1548   K 4.6 01/03/2016 1548   CL 103 01/03/2016  1548   CO2 28 01/03/2016 1548   GLUCOSE 73 01/03/2016 1548   BUN 17 01/03/2016 1548   CREATININE 0.80 06/04/2020 1236   CREATININE 0.73 01/03/2016 1548   CALCIUM 9.2 01/03/2016 1548   PROT 7.5 07/27/2013 1128   ALBUMIN 4.4 07/27/2013 1128   AST 20 07/27/2013 1128   ALT 21 07/27/2013 1128   ALKPHOS 87 07/27/2013  1128   BILITOT 0.8 07/27/2013 1128   GFRNONAA >90 07/27/2013 1128   GFRAA >90 07/27/2013 1128   No results found for: CHOL, HDL, LDLCALC, LDLDIRECT, TRIG, CHOLHDL No results found for: HGBA1C No results found for: VITAMINB12 Lab Results  Component Value Date   TSH 1.193 01/27/2019      ASSESSMENT AND PLAN 59 y.o. year old male  has a past medical history of Anxiety, DJD (degenerative joint disease), H/O cardiovascular stress test (2013), H/O echocardiogram (2012), History of cardiac catheterization, Hypertension, Obesity, Spinal stenosis, and Thrombocytopenia ([place]). here with:  OSA on CPAP  . CPAP compliance excellent . Residual AHI is good . Encouraged patient to continue using CPAP nightly and > 4 hours each night . F/U in 6 months or sooner if needed  I spent 20 minutes of face-to-face and non-face-to-face time with patient.  This included previsit chart review, lab review, study review, order entry, electronic health record documentation, patient education.  Ward Givens, MSN, NP-C 06/21/2020, 3:58 PM [facility] [address] [place], [place] [address] [phone]

## 2020-06-22 ENCOUNTER — Telehealth (INDEPENDENT_AMBULATORY_CARE_PROVIDER_SITE_OTHER): Payer: Medicare HMO | Admitting: Adult Health

## 2020-06-22 DIAGNOSIS — Z9989 Dependence on other enabling machines and devices: Secondary | ICD-10-CM

## 2020-06-22 DIAGNOSIS — M48062 Spinal stenosis, lumbar region with neurogenic claudication: Secondary | ICD-10-CM | POA: Diagnosis not present

## 2020-06-22 DIAGNOSIS — E7849 Other hyperlipidemia: Secondary | ICD-10-CM | POA: Diagnosis not present

## 2020-06-22 DIAGNOSIS — G4733 Obstructive sleep apnea (adult) (pediatric): Secondary | ICD-10-CM

## 2020-06-22 DIAGNOSIS — I10 Essential (primary) hypertension: Secondary | ICD-10-CM | POA: Diagnosis not present

## 2020-06-24 ENCOUNTER — Telehealth: Payer: Self-pay | Admitting: Adult Health

## 2020-06-28 DIAGNOSIS — M5412 Radiculopathy, cervical region: Secondary | ICD-10-CM | POA: Diagnosis not present

## 2020-07-02 DIAGNOSIS — M5416 Radiculopathy, lumbar region: Secondary | ICD-10-CM | POA: Diagnosis not present

## 2020-07-05 DIAGNOSIS — M5412 Radiculopathy, cervical region: Secondary | ICD-10-CM | POA: Diagnosis not present

## 2020-07-10 DIAGNOSIS — Z20822 Contact with and (suspected) exposure to covid-19: Secondary | ICD-10-CM | POA: Diagnosis not present

## 2020-07-12 DIAGNOSIS — M5412 Radiculopathy, cervical region: Secondary | ICD-10-CM | POA: Diagnosis not present

## 2020-07-12 DIAGNOSIS — G4733 Obstructive sleep apnea (adult) (pediatric): Secondary | ICD-10-CM | POA: Diagnosis not present

## 2020-07-22 ENCOUNTER — Encounter: Payer: Self-pay | Admitting: Gastroenterology

## 2020-07-22 ENCOUNTER — Other Ambulatory Visit: Payer: Self-pay

## 2020-07-22 ENCOUNTER — Ambulatory Visit (INDEPENDENT_AMBULATORY_CARE_PROVIDER_SITE_OTHER): Payer: Medicare HMO | Admitting: Gastroenterology

## 2020-07-22 VITALS — BP 134/87 | HR 68 | Temp 98.4°F | Ht 72.0 in | Wt 204.8 lb

## 2020-07-22 DIAGNOSIS — R69 Illness, unspecified: Secondary | ICD-10-CM | POA: Diagnosis not present

## 2020-07-22 DIAGNOSIS — D696 Thrombocytopenia, unspecified: Secondary | ICD-10-CM | POA: Diagnosis not present

## 2020-07-22 DIAGNOSIS — D500 Iron deficiency anemia secondary to blood loss (chronic): Secondary | ICD-10-CM

## 2020-07-22 DIAGNOSIS — K760 Fatty (change of) liver, not elsewhere classified: Secondary | ICD-10-CM | POA: Diagnosis not present

## 2020-07-22 DIAGNOSIS — K7460 Unspecified cirrhosis of liver: Secondary | ICD-10-CM | POA: Diagnosis not present

## 2020-07-22 NOTE — Progress Notes (Signed)
Referring Provider: Elfredia Nevins, MD Primary Care Physician:  Elfredia Nevins, MD Primary GI: Dr. Jena Gauss   Chief Complaint  Patient presents with  . Constipation  . pp f/u    HPI:   Jerome Stancato. is a 59 y.o. male presenting today with a history of chronic opioid-induced constipation, severe spinal stenosis, diagnosed with IDA last year with ferritin 11 in June 2021, now 46. Chronic thrombocytopenia noted.   Constipation: reduced dulcolax to 2 each evening. Amitiza daily. Has had to ration out as insurance does not cover well.   IDA: colonoscopy normal. EGD with Grade 1-2 esophageal varices, possible Barrett's s/p biopsy (negative Barretts), portal gastropathy. Gastropathy could be source of occult GI bleeding. Negative H.pylori. Reduced iron from 65 mg down to in the 40s as his stomach was hurting. Taking omeprazole every day.   US abdomen done in interim from last appointment with mild splenomegaly, cholelithiasis, slightly echogenic liver suggesting steatosis. Possible mass of right kidney. 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    Hep A negative in Jan 2015 Hep B surface antigen negative 2015 Hep C antibody negative 2015 HIV negative 2015  Needs Hep A/B vaccination.    Past Medical History:  Diagnosis Date  . Anxiety   . DJD (degenerative joint disease)   . H/O cardiovascular stress test 2013  . H/O echocardiogram 2012   for cad, htn & obesity  . History of cardiac catheterization    a. normal cors by cath in 2011  . Hypertension   . Obesity   . Spinal stenosis   . Thrombocytopenia ([facility])     Past Surgical History:  Procedure Laterality Date  . BACK SURGERY     x 4  . BIOPSY  05/27/2020   Procedure: BIOPSY;  Surgeon: Corbin Ade, MD;  [facility];  Service: Endoscopy;;  . BREAST REDUCTION SURGERY    . CARDIAC CATHETERIZATION  2011   5 frech sheath  . COLONOSCOPY N/A 10/14/2012   [id number] rectum and colon  .  COLONOSCOPY WITH PROPOFOL N/A 05/27/2020   normal  . CYST EXCISION Right 08/08/2016   Procedure: EXCISION OF RIGHT INGUINAL SUBCUTANEOUS MASS (LIKELY SEBACEIYS CYST);  Surgeon: Ancil Linsey, MD;  Location: [facility];  Service: General;  Laterality: Right;  7cm x 4cm mass; dermabond dressing  . ESOPHAGOGASTRODUODENOSCOPY (EGD) WITH PROPOFOL N/A 05/27/2020   Grade 1-2 esophageal varices, possible Barrett's s/p biopsy (negative Barretts), portal gastropathy. Gastropathy could be source of occult GI bleeding. Negative H.pylori.  Marland Kitchen FLEXIBLE SIGMOIDOSCOPY N/A 10/15/2013   markedly inflamed distal rectum with ulceration, rectal polyp benign. Acute ulceration of rectum.   . h/o cardiac cath    . LAPAROSCOPIC GASTRIC SLEEVE RESECTION  06/2012   Dr Roe Rutherford [facility])    Current Outpatient Medications  Medication Sig Dispense Refill  . acetaminophen (TYLENOL) 500 MG tablet Take 1,000 mg by mouth 2 (two) times daily as needed for moderate pain or headache.     Marland Kitchen ascorbic acid (VITAMIN C) 500 MG tablet Take 500 mg by mouth daily.    . B COMPLEX VITAMINS SL Place 1 Dose under the tongue daily. Liquid    . bisacodyl (DULCOLAX) 5 MG EC tablet Take 10 mg by mouth at bedtime.    . Calcium Carb-Cholecalciferol (CALCIUM/VITAMIN D PO) Take 1 tablet by mouth daily.    . cetirizine (ZYRTEC) 10 MG tablet Take 10 mg by mouth as needed for allergies.    Marland Kitchen  clonazePAM (KLONOPIN) 1 MG tablet Take 1 mg by mouth 2 (two) times daily.     . cyclobenzaprine (FLEXERIL) 5 MG tablet Take 1 tablet (5 mg total) by mouth 3 (three) times daily as needed for muscle spasms. Only as needed. (Patient taking differently: Take 5 mg by mouth at bedtime. Marland Kitchen) 30 tablet 1  . docusate sodium (COLACE) 100 MG capsule Take 300 mg by mouth at bedtime.    . ferrous sulfate 325 (65 FE) MG tablet Take 325 mg by mouth daily with breakfast.    . Flaxseed, Linseed, (FLAX SEED OIL) 1000 MG CAPS Take 1,000 mg by mouth daily.     . fluticasone (FLONASE) 50  MCG/ACT nasal spray Place 2 sprays into both nostrils daily as needed for congestion.  5  . HYDROcodone-acetaminophen (NORCO/VICODIN) 5-325 MG tablet Take 1 tablet by mouth every 6 (six) hours as needed for moderate pain.    Marland Kitchen losartan (COZAAR) 25 MG tablet Take 0.5 tablets (12.5 mg total) by mouth daily. 45 tablet 3  . lubiprostone (AMITIZA) 24 MCG capsule TAKE 1 CAPSULE BY MOUTH TWICE DAILY WITH FOOD (Patient taking differently: Take 24 mcg by mouth daily with breakfast.) 60 capsule 5  . Multiple Vitamin (MULTIVITAMIN WITH MINERALS) TABS tablet Take 1 tablet by mouth daily.    Marland Kitchen omeprazole (PRILOSEC) 20 MG capsule Take 20 mg by mouth at bedtime.    . pravastatin (PRAVACHOL) 40 MG tablet Take 40 mg by mouth daily.    . sertraline (ZOLOFT) 100 MG tablet Take 100 mg by mouth daily.    Marland Kitchen zinc gluconate 50 MG tablet Take 25 mg by mouth daily.      No current facility-administered medications for this visit.    Allergies as of 07/22/2020 - Review Complete 07/22/2020  Allergen Reaction Noted  . Aspirin Nausea And Vomiting 03/27/2014  . Codeine Nausea And Vomiting 10/14/2012  . Nsaids Anxiety 07/27/2013    Family History  Problem Relation Age of Onset  . Heart attack Father   . Colon cancer Paternal Grandmother   . Brain cancer Brother 66    Social History   Socioeconomic History  . Marital status: Divorced    Spouse name: Not on file  . Number of children: 2  . Years of education: Not on file  . Highest education level: Not on file  Occupational History  . 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 Smoking status: Current Every Day Smoker    Packs/day: 0.25    Years: 36.00    Pack years: 9.00    Types: Cigarettes  . Smokeless tobacco: Never Used  Vaping Use  . Vaping Use: Never used  Substance and Sexual Activity  . Alcohol use: Yes    Alcohol/week: 0.0 standard drinks    Comment: occasionally   . Drug use: No  . Sexual activity: Yes     Partners: Male  Other Topics Concern  . Not on file  Social History Narrative   Moving to [place] to care for brother w/ brain tumor   Lives w/ 2 daughters & 4 grandsons   Social Determinants of Health   Financial Resource Strain: Not on file  Food Insecurity: Not on file  Transportation Needs: Not on file  Physical Activity: Not on file  Stress: Not on file  Social Connections: Not on file    Review of Systems: Gen: Denies fever, chills, anorexia. Denies fatigue, weakness, weight loss.  CV: Denies chest pain, palpitations,  syncope, peripheral edema, and claudication. Resp: Denies dyspnea at rest, cough, wheezing, coughing up blood, and pleurisy. GI: see HPI Derm: Denies rash, itching, dry skin Psych: Denies depression, anxiety, memory loss, confusion. No homicidal or suicidal ideation.  Heme: Denies bruising, bleeding, and enlarged lymph nodes.  Physical Exam: BP 134/87   Pulse 68   Temp 98.4 F (36.9 C) (Temporal)   Ht 6' (1.829 m)   Wt 204 lb 12.8 oz (92.9 kg)   BMI 27.78 kg/m  General:   Alert and oriented. No distress noted. Pleasant and cooperative.  Head:  Normocephalic and atraumatic. Eyes:  Conjuctiva clear without scleral icterus. Mouth:  Mask in place Abdomen:  +BS, soft, non-tender and non-distended. No rebound or guarding. No HSM or masses noted. Msk:  Symmetrical without gross deformities. Normal posture. Extremities:  Without edema. Neurologic:  Alert and  oriented x4 Psych:  Alert and cooperative. Normal mood and affect.  ASSESSMENT: Jerome [name]. is a 59 y.o. male presenting today with a history of chronic opioid-induced constipation, severe spinal stenosis, diagnosed with IDA last year with ferritin 11 in June 2021, now 46, fatty liver with likely degree of cirrhosis in light of portal hypertensive changes on recent EGD.   CIC: continue Amitiza at novel dosing of 24 mcg only once daily, as he is rationing this out. Continue with additional OTC agents  including Dulcolax, which he is titrating down on own.   IDA: colonoscopy/EGD completed. Likely chronic blood loss due to portal gastropathy. Will update CBC and iron studies. Consider capsule if worsening.   Portal hypertension: with Grade 1-2 varices on EGD. Chronic thrombocytopenia noted, splenomegaly. US abdomen with fatty liver. Hep C, B negative. Needs Hep A/B vaccinations, as he is not immune. Will update baseline labs today (CBC, AFP, CMP, INR). Although [name] does not show outright cirrhosis, he does have portal hypertensive changes, and we will be proactive with following by [name] every 6 months and next EGD in 2 years. We discussed non-selective beta-blocker therapy, but in light of orthostatic hypotension, will follow with serial EGDs instead. He desires to avoid non-selective beta-blocker therapy. Well-compensated at this time.    PLAN:  EGD surveillance for varices in Nov 2023 Check labs now [name] in 6 months Keep following with Hematology Consider capsule if worsening IDA Hep A/B vaccination prescription provided Continue PPI daily Avoid alcohol and NSAIDs  Jerome Mink, PhD, ANP-BC [facility]

## 2020-07-22 NOTE — Patient Instructions (Signed)
Please have blood work completed, and I will message with results!  I recommend Hep A/B vaccinations. You can go to [facility] to have them order this.   We will pursue an ultrasound in 6 months!  I will see you back in 6 months.  I recommend avoidance of alcohol, avoid NSAIDs as you are doing, and continue omeprazole once daily.  Have a great New Year!  I enjoyed seeing you again today! As you know, I value our relationship and want to provide genuine, compassionate, and quality care. I welcome your feedback. If you receive a survey regarding your visit,  I greatly appreciate you taking time to fill this out. See you next time!  Gelene Mink, PhD, ANP-BC [facility]

## 2020-07-26 ENCOUNTER — Encounter: Payer: Self-pay | Admitting: Gastroenterology

## 2020-07-26 LAB — COMPLETE METABOLIC PANEL WITH GFR
AG Ratio: 2.7 (calc) — ABNORMAL HIGH (ref 1.0–2.5)
ALT: 14 U/L (ref 9–46)
AST: 17 U/L (ref 10–35)
Albumin: 4.8 g/dL (ref 3.6–5.1)
Alkaline phosphatase (APISO): 48 U/L (ref 35–144)
BUN: 9 mg/dL (ref 7–25)
CO2: 31 mmol/L (ref 20–32)
Calcium: 9.9 mg/dL (ref 8.6–10.3)
Chloride: 105 mmol/L (ref 98–110)
Creat: 0.81 mg/dL (ref 0.70–1.33)
GFR, Est African American: 114 mL/min/{1.73_m2} (ref >=60)
GFR, Est Non African American: 98 mL/min/{1.73_m2} (ref >=60)
Globulin: 1.8 g/dL (calc) — ABNORMAL LOW (ref 1.9–3.7)
Glucose, Bld: 83 mg/dL (ref 65–99)
Potassium: 4.3 mmol/L (ref 3.5–5.3)
Sodium: 143 mmol/L (ref 135–146)
Total Bilirubin: 0.6 mg/dL (ref 0.2–1.2)
Total Protein: 6.6 g/dL (ref 6.1–8.1)

## 2020-07-26 LAB — CBC WITH DIFFERENTIAL/PLATELET
Absolute Monocytes: 248 cells/uL (ref 200–950)
Basophils Absolute: 41 cells/uL (ref 0–200)
Basophils Relative: 0.9 %
Eosinophils Absolute: 41 cells/uL (ref 15–500)
Eosinophils Relative: 0.9 %
HCT: 47.7 % (ref 38.5–50.0)
Hemoglobin: 16.7 g/dL (ref 13.2–17.1)
Lymphs Abs: 1049 cells/uL (ref 850–3900)
MCH: 32.9 pg (ref 27.0–33.0)
MCHC: 35 g/dL (ref 32.0–36.0)
MCV: 94.1 fL (ref 80.0–100.0)
MPV: 10.7 fL (ref 7.5–12.5)
Monocytes Relative: 5.4 %
Neutro Abs: 3220 cells/uL (ref 1500–7800)
Neutrophils Relative %: 70 %
Platelets: 118 10*3/uL — ABNORMAL LOW (ref 140–400)
RBC: 5.07 10*6/uL (ref 4.20–5.80)
RDW: 12.5 % (ref 11.0–15.0)
Total Lymphocyte: 22.8 %
WBC: 4.6 10*3/uL (ref 3.8–10.8)

## 2020-07-26 LAB — PROTIME-INR
INR: 1
Prothrombin Time: 10.8 s (ref 9.0–11.5)

## 2020-07-26 LAB — IRON,TIBC AND FERRITIN PANEL
%SAT: 45 % (calc) (ref 20–48)
Ferritin: 25 ng/mL — ABNORMAL LOW (ref 38–380)
Iron: 153 ug/dL (ref 50–180)
TIBC: 339 mcg/dL (calc) (ref 250–425)

## 2020-07-26 LAB — AFP TUMOR MARKER: AFP-Tumor Marker: 4.4 ng/mL (ref <6.1)

## 2020-07-26 NOTE — Progress Notes (Signed)
Cc'ed to pcp °

## 2020-07-27 DIAGNOSIS — Z981 Arthrodesis status: Secondary | ICD-10-CM | POA: Diagnosis not present

## 2020-07-27 DIAGNOSIS — G8929 Other chronic pain: Secondary | ICD-10-CM | POA: Diagnosis not present

## 2020-07-27 DIAGNOSIS — M5412 Radiculopathy, cervical region: Secondary | ICD-10-CM | POA: Diagnosis not present

## 2020-07-29 DIAGNOSIS — M5416 Radiculopathy, lumbar region: Secondary | ICD-10-CM | POA: Diagnosis not present

## 2020-07-29 DIAGNOSIS — D696 Thrombocytopenia, unspecified: Secondary | ICD-10-CM | POA: Diagnosis not present

## 2020-07-29 DIAGNOSIS — M549 Dorsalgia, unspecified: Secondary | ICD-10-CM | POA: Diagnosis not present

## 2020-07-29 DIAGNOSIS — M47812 Spondylosis without myelopathy or radiculopathy, cervical region: Secondary | ICD-10-CM | POA: Diagnosis not present

## 2020-07-29 DIAGNOSIS — G894 Chronic pain syndrome: Secondary | ICD-10-CM | POA: Diagnosis not present

## 2020-07-29 DIAGNOSIS — M533 Sacrococcygeal disorders, not elsewhere classified: Secondary | ICD-10-CM | POA: Diagnosis not present

## 2020-07-30 DIAGNOSIS — H6123 Impacted cerumen, bilateral: Secondary | ICD-10-CM | POA: Diagnosis not present

## 2020-08-04 ENCOUNTER — Telehealth: Payer: Self-pay | Admitting: Internal Medicine

## 2020-08-04 NOTE — Telephone Encounter (Signed)
Pt said that his insurance would not cover the Hep A and B vaccine without a PA. Please advise [phone]

## 2020-08-06 DIAGNOSIS — Z20822 Contact with and (suspected) exposure to covid-19: Secondary | ICD-10-CM | POA: Diagnosis not present

## 2020-08-11 ENCOUNTER — Telehealth: Payer: Self-pay | Admitting: Internal Medicine

## 2020-08-11 DIAGNOSIS — G4733 Obstructive sleep apnea (adult) (pediatric): Secondary | ICD-10-CM | POA: Diagnosis not present

## 2020-08-11 NOTE — Telephone Encounter (Signed)
Please call patient at [phone] or either MyChart. He needs to speak with nurse regarding his Hep vaccines and needing a PA on his Amitiza and was asking for samples.

## 2020-08-11 NOTE — Telephone Encounter (Signed)
Lmom, waiting on a return call.  

## 2020-08-12 DIAGNOSIS — G4733 Obstructive sleep apnea (adult) (pediatric): Secondary | ICD-10-CM | POA: Diagnosis not present

## 2020-08-12 NOTE — Telephone Encounter (Signed)
Spoke with pt. PA is needed for Amitiza. PA was submitted via fax to [facility]. Waiting on an approval or denial.   Pt also mentioned that his insurance company will pay for Hep A & B vaccination through the pharmacy if it's a combined injection and not individual or will cover it through pts PCP. Pt would like to know if it's ok to have the combined dose of Hep A & B vaccination or if he needs to do them separately.

## 2020-08-12 NOTE — Telephone Encounter (Signed)
Spoke with pt and he was notified that it's ok to have the combined dose of Hep A & B vaccination. Pt is also aware that PA was submitted . Per pt, I can contact him on mychart with the out come of his PA.

## 2020-08-12 NOTE — Telephone Encounter (Signed)
Yes, combined vaccination is fine.

## 2020-08-21 DIAGNOSIS — M48062 Spinal stenosis, lumbar region with neurogenic claudication: Secondary | ICD-10-CM | POA: Diagnosis not present

## 2020-08-21 DIAGNOSIS — E7849 Other hyperlipidemia: Secondary | ICD-10-CM | POA: Diagnosis not present

## 2020-08-21 DIAGNOSIS — I10 Essential (primary) hypertension: Secondary | ICD-10-CM | POA: Diagnosis not present

## 2020-08-26 DIAGNOSIS — R69 Illness, unspecified: Secondary | ICD-10-CM | POA: Diagnosis not present

## 2020-08-26 DIAGNOSIS — M48062 Spinal stenosis, lumbar region with neurogenic claudication: Secondary | ICD-10-CM | POA: Diagnosis not present

## 2020-08-26 DIAGNOSIS — E7849 Other hyperlipidemia: Secondary | ICD-10-CM | POA: Diagnosis not present

## 2020-08-26 DIAGNOSIS — I10 Essential (primary) hypertension: Secondary | ICD-10-CM | POA: Diagnosis not present

## 2020-08-26 DIAGNOSIS — Z6828 Body mass index (BMI) 28.0-28.9, adult: Secondary | ICD-10-CM | POA: Diagnosis not present

## 2020-08-26 DIAGNOSIS — Z1331 Encounter for screening for depression: Secondary | ICD-10-CM | POA: Diagnosis not present

## 2020-08-31 ENCOUNTER — Ambulatory Visit: Payer: Medicare HMO | Admitting: Gastroenterology

## 2020-08-31 DIAGNOSIS — M5441 Lumbago with sciatica, right side: Secondary | ICD-10-CM | POA: Diagnosis not present

## 2020-08-31 DIAGNOSIS — Z5181 Encounter for therapeutic drug level monitoring: Secondary | ICD-10-CM | POA: Diagnosis not present

## 2020-08-31 DIAGNOSIS — M5412 Radiculopathy, cervical region: Secondary | ICD-10-CM | POA: Diagnosis not present

## 2020-08-31 DIAGNOSIS — G8929 Other chronic pain: Secondary | ICD-10-CM | POA: Diagnosis not present

## 2020-09-11 DIAGNOSIS — G4733 Obstructive sleep apnea (adult) (pediatric): Secondary | ICD-10-CM | POA: Diagnosis not present

## 2020-09-12 DIAGNOSIS — G4733 Obstructive sleep apnea (adult) (pediatric): Secondary | ICD-10-CM | POA: Diagnosis not present

## 2020-09-14 DIAGNOSIS — M5441 Lumbago with sciatica, right side: Secondary | ICD-10-CM | POA: Diagnosis not present

## 2020-09-14 DIAGNOSIS — G8929 Other chronic pain: Secondary | ICD-10-CM | POA: Diagnosis not present

## 2020-09-14 DIAGNOSIS — M5412 Radiculopathy, cervical region: Secondary | ICD-10-CM | POA: Diagnosis not present

## 2020-09-20 DIAGNOSIS — M48062 Spinal stenosis, lumbar region with neurogenic claudication: Secondary | ICD-10-CM | POA: Diagnosis not present

## 2020-09-20 DIAGNOSIS — I10 Essential (primary) hypertension: Secondary | ICD-10-CM | POA: Diagnosis not present

## 2020-09-20 DIAGNOSIS — E7849 Other hyperlipidemia: Secondary | ICD-10-CM | POA: Diagnosis not present

## 2020-09-30 DIAGNOSIS — R69 Illness, unspecified: Secondary | ICD-10-CM | POA: Diagnosis not present

## 2020-09-30 DIAGNOSIS — F418 Other specified anxiety disorders: Secondary | ICD-10-CM | POA: Diagnosis not present

## 2020-09-30 DIAGNOSIS — M79606 Pain in leg, unspecified: Secondary | ICD-10-CM | POA: Diagnosis not present

## 2020-09-30 DIAGNOSIS — G8929 Other chronic pain: Secondary | ICD-10-CM | POA: Diagnosis not present

## 2020-10-09 DIAGNOSIS — G4733 Obstructive sleep apnea (adult) (pediatric): Secondary | ICD-10-CM | POA: Diagnosis not present

## 2020-10-10 DIAGNOSIS — G4733 Obstructive sleep apnea (adult) (pediatric): Secondary | ICD-10-CM | POA: Diagnosis not present

## 2020-10-12 DIAGNOSIS — M5416 Radiculopathy, lumbar region: Secondary | ICD-10-CM | POA: Diagnosis not present

## 2020-10-12 DIAGNOSIS — M5412 Radiculopathy, cervical region: Secondary | ICD-10-CM | POA: Diagnosis not present

## 2020-10-12 DIAGNOSIS — G959 Disease of spinal cord, unspecified: Secondary | ICD-10-CM | POA: Diagnosis not present

## 2020-10-19 DIAGNOSIS — M5416 Radiculopathy, lumbar region: Secondary | ICD-10-CM | POA: Diagnosis not present

## 2020-10-19 DIAGNOSIS — D696 Thrombocytopenia, unspecified: Secondary | ICD-10-CM | POA: Diagnosis not present

## 2020-10-19 DIAGNOSIS — M533 Sacrococcygeal disorders, not elsewhere classified: Secondary | ICD-10-CM | POA: Diagnosis not present

## 2020-10-19 DIAGNOSIS — G894 Chronic pain syndrome: Secondary | ICD-10-CM | POA: Diagnosis not present

## 2020-10-19 DIAGNOSIS — M47812 Spondylosis without myelopathy or radiculopathy, cervical region: Secondary | ICD-10-CM | POA: Diagnosis not present

## 2020-10-19 DIAGNOSIS — Z5181 Encounter for therapeutic drug level monitoring: Secondary | ICD-10-CM | POA: Diagnosis not present

## 2020-10-20 DIAGNOSIS — E7849 Other hyperlipidemia: Secondary | ICD-10-CM | POA: Diagnosis not present

## 2020-10-20 DIAGNOSIS — I10 Essential (primary) hypertension: Secondary | ICD-10-CM | POA: Diagnosis not present

## 2020-10-21 DIAGNOSIS — M5412 Radiculopathy, cervical region: Secondary | ICD-10-CM | POA: Diagnosis not present

## 2020-11-10 DIAGNOSIS — G4733 Obstructive sleep apnea (adult) (pediatric): Secondary | ICD-10-CM | POA: Diagnosis not present

## 2020-11-15 ENCOUNTER — Other Ambulatory Visit: Payer: Self-pay

## 2020-11-15 ENCOUNTER — Telehealth: Payer: Self-pay | Admitting: Gastroenterology

## 2020-11-15 DIAGNOSIS — D500 Iron deficiency anemia secondary to blood loss (chronic): Secondary | ICD-10-CM

## 2020-11-15 NOTE — Telephone Encounter (Signed)
Jerome [name],  I placed orders for labs (CBC, CMP, and iron studies). Patient is aware to go get them. They are for [facility] and just need to be released. Thanks!  Vicente Males

## 2020-11-15 NOTE — Telephone Encounter (Signed)
NOTED

## 2020-11-16 DIAGNOSIS — D500 Iron deficiency anemia secondary to blood loss (chronic): Secondary | ICD-10-CM | POA: Diagnosis not present

## 2020-11-16 LAB — CBC WITH DIFFERENTIAL/PLATELET
Absolute Monocytes: 319 cells/uL (ref 200–950)
Basophils Absolute: 38 cells/uL (ref 0–200)
Basophils Relative: 0.7 %
Eosinophils Absolute: 81 cells/uL (ref 15–500)
Eosinophils Relative: 1.5 %
HCT: 48.5 % (ref 38.5–50.0)
Hemoglobin: 16.5 g/dL (ref 13.2–17.1)
Lymphs Abs: 1312 cells/uL (ref 850–3900)
MCH: 31.5 pg (ref 27.0–33.0)
MCHC: 34 g/dL (ref 32.0–36.0)
MCV: 92.7 fL (ref 80.0–100.0)
MPV: 11 fL (ref 7.5–12.5)
Monocytes Relative: 5.9 %
Neutro Abs: 3650 cells/uL (ref 1500–7800)
Neutrophils Relative %: 67.6 %
Platelets: 123 10*3/uL — ABNORMAL LOW (ref 140–400)
RBC: 5.23 10*6/uL (ref 4.20–5.80)
RDW: 13 % (ref 11.0–15.0)
Total Lymphocyte: 24.3 %
WBC: 5.4 10*3/uL (ref 3.8–10.8)

## 2020-11-16 LAB — IRON,TIBC AND FERRITIN PANEL
%SAT: 36 % (calc) (ref 20–48)
Ferritin: 39 ng/mL (ref 38–380)
Iron: 113 ug/dL (ref 50–180)
TIBC: 314 mcg/dL (calc) (ref 250–425)

## 2020-11-16 LAB — COMPLETE METABOLIC PANEL WITH GFR
AG Ratio: 2.4 (calc) (ref 1.0–2.5)
ALT: 13 U/L (ref 9–46)
AST: 15 U/L (ref 10–35)
Albumin: 4.5 g/dL (ref 3.6–5.1)
Alkaline phosphatase (APISO): 48 U/L (ref 35–144)
BUN: 11 mg/dL (ref 7–25)
CO2: 30 mmol/L (ref 20–32)
Calcium: 9.3 mg/dL (ref 8.6–10.3)
Chloride: 102 mmol/L (ref 98–110)
Creat: 0.84 mg/dL (ref 0.70–1.33)
GFR, Est African American: 112 mL/min/{1.73_m2} (ref >=60)
GFR, Est Non African American: 97 mL/min/{1.73_m2} (ref >=60)
Globulin: 1.9 g/dL (calc) (ref 1.9–3.7)
Glucose, Bld: 79 mg/dL (ref 65–139)
Potassium: 3.9 mmol/L (ref 3.5–5.3)
Sodium: 138 mmol/L (ref 135–146)
Total Bilirubin: 0.7 mg/dL (ref 0.2–1.2)
Total Protein: 6.4 g/dL (ref 6.1–8.1)

## 2020-11-20 DIAGNOSIS — I10 Essential (primary) hypertension: Secondary | ICD-10-CM | POA: Diagnosis not present

## 2020-11-20 DIAGNOSIS — E7849 Other hyperlipidemia: Secondary | ICD-10-CM | POA: Diagnosis not present

## 2020-11-23 ENCOUNTER — Encounter: Payer: Self-pay | Admitting: Internal Medicine

## 2020-11-24 ENCOUNTER — Other Ambulatory Visit: Payer: Self-pay

## 2020-11-24 ENCOUNTER — Telehealth: Payer: Self-pay | Admitting: Gastroenterology

## 2020-11-24 DIAGNOSIS — K5900 Constipation, unspecified: Secondary | ICD-10-CM

## 2020-11-24 DIAGNOSIS — R5383 Other fatigue: Secondary | ICD-10-CM

## 2020-11-24 NOTE — Telephone Encounter (Signed)
I have ordered a TSH. Patient will be having this done. Just needs to be released. Thanks!

## 2020-11-24 NOTE — Telephone Encounter (Signed)
Noted. Order was released as directed.

## 2020-11-25 NOTE — Progress Notes (Addendum)
Cardiology Office Note  Date: 05/25/2021   ID: Jerome Rhine., DOB 02/23/1961, MRN [id number]  PCP:  Redmond School, MD  Cardiologist:  None Electrophysiologist:  None   Chief Complaint: Follow-up  History of Present Illness: Jerome Lotter. is a 60 y.o. male with a history of palpitations, near syncope, varicose veins of leg with complications, essential hypertension, thrombocytopenia, OSA on CPAP, esophageal varices and suspected Barrett's esophagus on recent endoscopy, iron deficiency anemia.  Last visit with Dr. Bronson Ing 03/27/2019 for follow-up of palpitations and dizziness.  He had been evaluated in the office June 2020 .  increased fluid consumption and use of compression stockings were recommended.  Losartan been decreased to 12.5 mg daily and echocardiogram was ordered.  Echocardiogram [name] 2020 demonstrated EF of 60 to 23%, diastolic function was normal.  No significant valvular disease.  He started having elevated blood pressures went back to taking losartan 25 mg daily.  He has been wearing his compression stocking on left leg about 3 nights per week.  Complained of episodic dizziness.  He denies any syncope, shortness of breath or chest pain.  His blood pressure was well controlled and he was continuing losartan 12.5 mg daily.  He was continue compression stockings for venous varicosities.  He was following neurology in [facility] for sleep disordered breathing.  It was recommended he increase fluid consumption and compression stockings.    He is here for follow-up today.  States she has been feeling some shortness of breath and fatigue recently.  He is chronic iron deficiency anemia.  Recent labs showed stable hemoglobin hematocrit and stable iron indices.  He is currently taking iron supplementation.  States he does not have any energy.  He takes vitamin D, vitamin C and B vitamins along with zinc.  He states he has not had any recent thyroid function lab work.  He has a  history of portal hypertension and esophageal varices with suspected Barrett's esophagus.  Had recent endoscopy and colonoscopy with Dr. Gala Romney.  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  He also has venous disease with venous varicosities and venous insufficiency.  Blood pressures well controlled today.  Blood pressure 112/80.  EKG today shows normal sinus rhythm with a rate of 76.   Past Medical History:  Diagnosis Date   Anxiety    DJD (degenerative joint disease)    H/O cardiovascular stress test 2013   H/O echocardiogram 2012   for cad, htn & obesity   History of cardiac catheterization    a. normal cors by cath in 2011   Hypertension    Obesity    Spinal stenosis    Thrombocytopenia [facility])     Past Surgical History:  Procedure Laterality Date   BACK SURGERY     x 4   BIOPSY  05/27/2020   Procedure: BIOPSY;  Surgeon: Daneil Dolin, MD;  [facility];  Service: Endoscopy;;   BREAST REDUCTION SURGERY     CARDIAC CATHETERIZATION  2011   5 frech sheath   COLONOSCOPY N/A 10/14/2012   [id number] rectum and colon   COLONOSCOPY WITH PROPOFOL N/A 05/27/2020   normal   CYST EXCISION Right 08/08/2016   Procedure: EXCISION OF RIGHT INGUINAL SUBCUTANEOUS MASS (LIKELY SEBACEIYS CYST);  Surgeon: Vickie Epley, MD;  Location: [facility] ORS;  Service: General;  Laterality: Right;  7cm x 4cm mass; dermabond dressing   ESOPHAGOGASTRODUODENOSCOPY (EGD) WITH PROPOFOL N/A 05/27/2020  Grade 1-2 esophageal varices, possible Barrett's s/p biopsy (negative Barretts), portal gastropathy. Gastropathy could be source of occult GI bleeding. Negative H.pylori.   FLEXIBLE SIGMOIDOSCOPY N/A 10/15/2013   markedly inflamed distal rectum with ulceration, rectal polyp benign. Acute ulceration of rectum.    GIVENS CAPSULE STUDY N/A 01/12/2021   Procedure: GIVENS CAPSULE STUDY;  Surgeon: Daneil Dolin, MD;  Location[facility];  Service: Endoscopy;   Laterality: N/A;  8:30am   h/o cardiac cath     LAPAROSCOPIC GASTRIC SLEEVE RESECTION  06/2012   Dr Mickie Bail [facility])    Current Outpatient Medications  Medication Sig Dispense Refill   acetaminophen (TYLENOL) 500 MG tablet Take 1,000 mg by mouth 2 (two) times daily as needed for moderate pain or headache.      ascorbic acid (VITAMIN C) 500 MG tablet Take 500 mg by mouth daily.     B COMPLEX VITAMINS SL Place 1 Dose under the tongue daily. Liquid     bisacodyl (DULCOLAX) 5 MG EC tablet Take 10 mg by mouth at bedtime.     Calcium Carb-Cholecalciferol (CALCIUM/VITAMIN D PO) Take 1 tablet by mouth daily.     cetirizine (ZYRTEC) 10 MG tablet Take 10 mg by mouth as needed for allergies.     clonazePAM (KLONOPIN) 1 MG tablet Take 1 mg by mouth 2 (two) times daily.      cyclobenzaprine (FLEXERIL) 5 MG tablet Take 1 tablet (5 mg total) by mouth 3 (three) times daily as needed for muscle spasms. Only as needed. (Patient taking differently: Take 5 mg by mouth at bedtime. Marland Kitchen) 30 tablet 1   docusate sodium (COLACE) 100 MG capsule Take 300 mg by mouth at bedtime.     ferrous sulfate 325 (65 FE) MG tablet Take 325 mg by mouth daily with breakfast.     Flaxseed, Linseed, (FLAX SEED OIL) 1000 MG CAPS Take 1,000 mg by mouth daily.      fluticasone (FLONASE) 50 MCG/ACT nasal spray Place 2 sprays into both nostrils daily as needed for congestion.  5   HYDROcodone-acetaminophen (NORCO/VICODIN) 5-325 MG tablet Take 1 tablet by mouth every 6 (six) hours as needed for moderate pain.     losartan (COZAAR) 25 MG tablet Take 0.5 tablets (12.5 mg total) by mouth daily. 45 tablet 3   lubiprostone (AMITIZA) 24 MCG capsule TAKE 1 CAPSULE BY MOUTH TWICE DAILY WITH FOOD (Patient taking differently: Take 24 mcg by mouth daily with breakfast.) 60 capsule 5   Multiple Vitamin (MULTIVITAMIN WITH MINERALS) TABS tablet Take 1 tablet by mouth daily.     omeprazole (PRILOSEC) 20 MG capsule Take 20 mg by mouth at bedtime.      pravastatin (PRAVACHOL) 40 MG tablet Take 40 mg by mouth daily.     sertraline (ZOLOFT) 100 MG tablet Take 100 mg by mouth daily.     zinc gluconate 50 MG tablet Take 25 mg by mouth daily.      methylPREDNISolone (MEDROL DOSEPAK) 4 MG TBPK tablet Take as directed 21 tablet 0   No current facility-administered medications for this visit.   Allergies:  Aspirin, Codeine, and Nsaids   Social History: The patient  reports that he has been smoking cigarettes. He has a 9.00 pack-year smoking history. He has never used smokeless tobacco. He reports current alcohol use. He reports that he does not use drugs.   Family History: The patient's family history includes Brain cancer (age of onset: 80) in his brother; Colon cancer in his paternal  grandmother; Heart attack in his father.   ROS:  Please see the history of present illness. Otherwise, complete review of systems is positive for none.  All other systems are reviewed and negative.   Physical Exam: VS:  BP 112/80   Pulse 72   Ht 5\' 11"  (1.803 m)   Wt 211 lb (95.7 kg)   SpO2 96%   BMI 29.43 kg/m , BMI Body mass index is 29.43 kg/m.  Wt Readings from Last 3 Encounters:  03/24/21 194 lb 12.8 oz (88.4 kg)  03/23/21 195 lb (88.5 kg)  01/12/21 210 lb (95.3 kg)    General: Patient appears comfortable at rest. Neck: Supple, no elevated JVP or carotid bruits, no thyromegaly. Lungs: Clear to auscultation, nonlabored breathing at rest. Cardiac: Regular rate and rhythm, no S3 or significant systolic murmur, no pericardial rub. Extremities: No pitting edema, distal pulses 2+. Skin: Warm and dry. Musculoskeletal: No kyphosis. Neuropsychiatric: Alert and oriented x3, affect grossly appropriate.  ECG:  An ECG dated 11/26/2020 was personally reviewed today and demonstrated:  Normal sinus rhythm rate of 76.  Recent Labwork: 11/16/2020: ALT 13; AST 15; BUN 11; Creat 0.84; Hemoglobin 16.5; Platelets 123; Potassium 3.9; Sodium 138 11/29/2020: TSH 1.20  No  results found for: CHOL, TRIG, HDL, CHOLHDL, VLDL, LDLCALC, LDLDIRECT  Other Studies Reviewed Today:  Echocardiogram 01/27/2019  1. The left ventricle has normal systolic function with an ejection  fraction of 60-65%. The cavity size was normal. There is mildly increased  left ventricular wall thickness. Left ventricular diastolic parameters  were normal.   2. The right ventricle has normal systolic function. The cavity was  normal. There is no increase in right ventricular wall thickness.   3. No evidence of mitral valve stenosis.   4. The aortic valve has an indeterminate number of cusps. No stenosis of  the aortic valve.   5. The aortic root is normal in size and structure.   Assessment and Plan:  1. Palpitations   2. Essential hypertension   3. Sleep-disordered breathing   4. Fatigue, unspecified type   5. Iron deficiency anemia, unspecified iron deficiency anemia type   6. SOB (shortness of breath)    1. Palpitations Denies any recent significant palpitations.  States the palpitations are occasional.  2. Essential hypertension Blood pressure well controlled today.  Continue losartan 12.5 mg p.o. daily.  3. Sleep-disordered breathing He is compliant with his CPAP.  4.  Fatigue/iron deficiency anemia Complaining of increasing fatigue.  His recent CBC and iron indices were normal.  He has fluctuating issues with anemia per his statement.  He is continuing with iron supplementation.  Please get a thyroid panel, vitamin D, B12 levels.  5.  Shortness of breath, increase activity intolerance. Complaining of increased shortness of breath and activity intolerance along with fatigue.  Please get an echocardiogram to assess LV function, diastolic function, valvular function.  6.  Hyperlipidemia Continue pravastatin 40 mg p.o. daily.  Medication Adjustments/Labs and Tests Ordered: Current medicines are reviewed at length with the patient today.  Concerns regarding medicines are  outlined above.   Disposition: Follow-up with Dr. Harl Bowie or APP 6 months  Signed, Jerome July, Jerome [name] 05/25/2021 8:09 PM    [facility] [address], [place], [place] [address] Phone: [phone]; Fax: [phone]

## 2020-11-26 ENCOUNTER — Ambulatory Visit: Payer: Medicare HMO | Admitting: Family Medicine

## 2020-11-26 ENCOUNTER — Encounter: Payer: Self-pay | Admitting: Family Medicine

## 2020-11-26 VITALS — BP 112/80 | HR 72 | Ht 71.0 in | Wt 211.0 lb

## 2020-11-26 DIAGNOSIS — R002 Palpitations: Secondary | ICD-10-CM

## 2020-11-26 DIAGNOSIS — G4730 Sleep apnea, unspecified: Secondary | ICD-10-CM | POA: Diagnosis not present

## 2020-11-26 DIAGNOSIS — R5383 Other fatigue: Secondary | ICD-10-CM | POA: Diagnosis not present

## 2020-11-26 DIAGNOSIS — D509 Iron deficiency anemia, unspecified: Secondary | ICD-10-CM | POA: Diagnosis not present

## 2020-11-26 DIAGNOSIS — R0602 Shortness of breath: Secondary | ICD-10-CM | POA: Diagnosis not present

## 2020-11-26 DIAGNOSIS — I83899 Varicose veins of unspecified lower extremities with other complications: Secondary | ICD-10-CM

## 2020-11-26 DIAGNOSIS — I10 Essential (primary) hypertension: Secondary | ICD-10-CM | POA: Diagnosis not present

## 2020-11-26 NOTE — Patient Instructions (Signed)
Your physician recommends that you schedule a follow-up appointment in: [place] MD  Your physician recommends that you continue on your current medications as directed. Please refer to the Current Medication list given to you today.  Your physician has requested that you have an echocardiogram. Echocardiography is a painless test that uses sound waves to create images of your heart. It provides your doctor with information about the size and shape of your heart and how well your heart's chambers and valves are working. This procedure takes approximately one hour. There are no restrictions for this procedure.  Your physician recommends that you return for lab work in: TSH/T3/T4  Thank you for choosing [facility]!!

## 2020-11-29 ENCOUNTER — Telehealth: Payer: Self-pay | Admitting: Gastroenterology

## 2020-11-29 ENCOUNTER — Other Ambulatory Visit: Payer: Self-pay | Admitting: Cardiology

## 2020-11-29 DIAGNOSIS — R002 Palpitations: Secondary | ICD-10-CM | POA: Diagnosis not present

## 2020-11-29 DIAGNOSIS — R0602 Shortness of breath: Secondary | ICD-10-CM | POA: Diagnosis not present

## 2020-11-29 LAB — TSH: TSH: 1.2 mIU/L (ref 0.40–4.50)

## 2020-11-29 LAB — T3, FREE: T3, Free: 2.7 pg/mL (ref 2.3–4.2)

## 2020-11-29 LAB — T4, FREE: Free T4: 1.1 ng/dL (ref 0.8–1.8)

## 2020-11-29 NOTE — Telephone Encounter (Signed)
Called pt, Jerome [name] scheduled for 01/12/21 at 8:30am, arrive at 8:00am. Informed him to hold Iron for 1 week prior to appt. Orders entered. Instructions mailed.   Message sent to Roseanne Kaufman NP to read Jerome [name].

## 2020-11-29 NOTE — Telephone Encounter (Signed)
[facility] clinical pool: please arrange capsule study due to IDA. Thanks!

## 2020-12-01 ENCOUNTER — Ambulatory Visit (HOSPITAL_COMMUNITY)
Admission: RE | Admit: 2020-12-01 | Discharge: 2020-12-01 | Disposition: A | Discharge status: Home / Self Care | Payer: Medicare HMO | Source: Ambulatory Visit | Attending: Family Medicine | Admitting: Family Medicine

## 2020-12-01 ENCOUNTER — Other Ambulatory Visit: Payer: Self-pay

## 2020-12-01 DIAGNOSIS — R0602 Shortness of breath: Secondary | ICD-10-CM | POA: Diagnosis not present

## 2020-12-01 LAB — ECHOCARDIOGRAM COMPLETE
AR max vel: 3.67 cm2
AV Area VTI: 3.84 cm2
AV Area mean vel: 3.79 cm2
AV Mean grad: 1.8 mmHg
AV Peak grad: 3.7 mmHg
Ao pk vel: 0.96 m/s
Area-P 1/2: 2.84 cm2
S' Lateral: 3.64 cm

## 2020-12-01 NOTE — Progress Notes (Signed)
2D echo completed.   Leavy Cella, RDCS

## 2020-12-06 DIAGNOSIS — G4733 Obstructive sleep apnea (adult) (pediatric): Secondary | ICD-10-CM | POA: Diagnosis not present

## 2020-12-07 ENCOUNTER — Telehealth: Payer: Self-pay | Admitting: *Deleted

## 2020-12-07 NOTE — Telephone Encounter (Signed)
-----   Message from Arnoldo Lenis, MD sent at 12/07/2020  7:34 AM EDT ----- Normal thyroid labs  Zandra Abts MD

## 2020-12-07 NOTE — Telephone Encounter (Signed)
Pt voiced understanding    Verta Ellen., NP  Laurine Blazer, LPN Please call the patient and let him know the echocardiogram shows he has good pumping function of the heart. The main pumping chamber is a little muscular. Best management is to keep blood pressure at or below 130/80 consistently.

## 2020-12-10 DIAGNOSIS — G4733 Obstructive sleep apnea (adult) (pediatric): Secondary | ICD-10-CM | POA: Diagnosis not present

## 2020-12-14 DIAGNOSIS — F418 Other specified anxiety disorders: Secondary | ICD-10-CM | POA: Diagnosis not present

## 2020-12-14 DIAGNOSIS — R69 Illness, unspecified: Secondary | ICD-10-CM | POA: Diagnosis not present

## 2020-12-21 ENCOUNTER — Encounter: Payer: Self-pay | Admitting: Adult Health

## 2020-12-21 ENCOUNTER — Telehealth (INDEPENDENT_AMBULATORY_CARE_PROVIDER_SITE_OTHER): Payer: Medicare HMO | Admitting: Adult Health

## 2020-12-21 DIAGNOSIS — Z9989 Dependence on other enabling machines and devices: Secondary | ICD-10-CM

## 2020-12-21 DIAGNOSIS — G4733 Obstructive sleep apnea (adult) (pediatric): Secondary | ICD-10-CM

## 2020-12-21 DIAGNOSIS — E7849 Other hyperlipidemia: Secondary | ICD-10-CM | POA: Diagnosis not present

## 2020-12-21 DIAGNOSIS — I10 Essential (primary) hypertension: Secondary | ICD-10-CM | POA: Diagnosis not present

## 2020-12-21 NOTE — Progress Notes (Signed)
PATIENT: Jerome [name]. DOB: October 29, 1960  REASON FOR VISIT: follow up HISTORY FROM: patient  Virtual Visit via Video Note  I connected with Jerome [name]. on 12/21/20 at  1:15 PM EDT by a video enabled telemedicine application located remotely at [facility] and verified that I am speaking with the correct person using two identifiers who was located at their own home.   I discussed the limitations of evaluation and management by telemedicine and the availability of in person appointments. The patient expressed understanding and agreed to proceed.   PATIENT: Jerome [name]. DOB: February 17, 1961  REASON FOR VISIT: follow up HISTORY FROM: patient  HISTORY OF PRESENT ILLNESS: Today 12/21/20:  Jerome [name] is a 60 year old male with a history of OSA on CPAP. He joins me today for a video visit. His download indicates that he uses his machine 28/30 for compliance of 93.3%. he used his machine > 4 hours for compliance of 86.7%. On average he uses his machine 5 hours 43 mins.  His residual AHI is 5.3 on 6 to 16 cm of water with EPR of 3.  He reports that the CPAP is working well for him.  He denies any new issues.  06/22/20: Jerome [name] is a 60 year old male with a history of OSA on CPAP.  His download indicates that he uses his machine 29 out of 30 days for compliance of 93.5%.  He uses machine greater than 4 hours for compliance of 83.9%.  On average he uses his machine 6 hours and 26 minutes.  His residual AHI is 5.5 on 6 to 16 cm of water with EPR 3.  He reports that the CPAP is working well for him.  He has noticed a significant difference in how he feels.  He is not happy with the full facemask.  He states that it makes the bridge of his nose very sore.  He is currently putting Band-Aids across his nose to add more comfort.  He returns today for virtual visit  HISTORY 06/10/20:  Jerome [name] is a 60 year old male with a history of obstructive sleep apnea on CPAP.   His download indicates that he uses machine 27 out of 30 days for compliance of 90%.  He uses machine greater than 4 hours for compliance of 73.3%.  On average he uses his machine 6 hours and 50 minutes.  His residual AHI is 5.4 on 6 to 16 cm of water.  He reports that he has noticed the biggest change since he started using CPAP.  He is having a hard time using a full facemask.  He states that it rubs a spot on the top of his nose.  He returns today for follow-up.  He does note that he just got machine October 20 so we are not quite at 31 days yet for his initial CPAP visit.  REVIEW OF SYSTEMS: Out of a complete 14 system review of symptoms, the patient complains only of the following symptoms, and all other reviewed systems are negative.  See HPI  ALLERGIES: Allergies  Allergen Reactions  . Aspirin Nausea And Vomiting    Due to gastric sleeve surgery   . Codeine Nausea And Vomiting  . Nsaids Anxiety    Due to gastric sleeve surgery  N/V    HOME MEDICATIONS: Outpatient Medications Prior to Visit  Medication Sig Dispense Refill  . acetaminophen (TYLENOL) 500 MG tablet Take 1,000 mg by mouth 2 (  two) times daily as needed for moderate pain or headache.     Marland Kitchen ascorbic acid (VITAMIN C) 500 MG tablet Take 500 mg by mouth daily.    . B COMPLEX VITAMINS SL Place 1 Dose under the tongue daily. Liquid    . bisacodyl (DULCOLAX) 5 MG EC tablet Take 10 mg by mouth at bedtime.    . Calcium Carb-Cholecalciferol (CALCIUM/VITAMIN D PO) Take 1 tablet by mouth daily.    . cetirizine (ZYRTEC) 10 MG tablet Take 10 mg by mouth as needed for allergies.    . clonazePAM (KLONOPIN) 1 MG tablet Take 1 mg by mouth 2 (two) times daily.     . cyclobenzaprine (FLEXERIL) 5 MG tablet Take 1 tablet (5 mg total) by mouth 3 (three) times daily as needed for muscle spasms. Only as needed. (Patient taking differently: Take 5 mg by mouth at bedtime. Marland Kitchen) 30 tablet 1  . docusate sodium (COLACE) 100 MG capsule Take 300 mg by  mouth at bedtime.    . ferrous sulfate 325 (65 FE) MG tablet Take 325 mg by mouth daily with breakfast.    . Flaxseed, Linseed, (FLAX SEED OIL) 1000 MG CAPS Take 1,000 mg by mouth daily.     . fluticasone (FLONASE) 50 MCG/ACT nasal spray Place 2 sprays into both nostrils daily as needed for congestion.  5  . HYDROcodone-acetaminophen (NORCO/VICODIN) 5-325 MG tablet Take 1 tablet by mouth every 6 (six) hours as needed for moderate pain.    Marland Kitchen losartan (COZAAR) 25 MG tablet Take 0.5 tablets (12.5 mg total) by mouth daily. 45 tablet 3  . lubiprostone (AMITIZA) 24 MCG capsule TAKE 1 CAPSULE BY MOUTH TWICE DAILY WITH FOOD (Patient taking differently: Take 24 mcg by mouth daily with breakfast.) 60 capsule 5  . Multiple Vitamin (MULTIVITAMIN WITH MINERALS) TABS tablet Take 1 tablet by mouth daily.    Marland Kitchen omeprazole (PRILOSEC) 20 MG capsule Take 20 mg by mouth at bedtime.    . pravastatin (PRAVACHOL) 40 MG tablet Take 40 mg by mouth daily.    . sertraline (ZOLOFT) 100 MG tablet Take 100 mg by mouth daily.    Marland Kitchen zinc gluconate 50 MG tablet Take 25 mg by mouth daily.      No facility-administered medications prior to visit.    PAST MEDICAL HISTORY: Past Medical History:  Diagnosis Date  . Anxiety   . DJD (degenerative joint disease)   . H/O cardiovascular stress test 2013  . H/O echocardiogram 2012   for cad, htn & obesity  . History of cardiac catheterization    a. normal cors by cath in 2011  . Hypertension   . Obesity   . Spinal stenosis   . Thrombocytopenia ([place])     PAST SURGICAL HISTORY: Past Surgical History:  Procedure Laterality Date  . BACK SURGERY     x 4  . BIOPSY  05/27/2020   Procedure: BIOPSY;  Surgeon: Daneil Dolin, MD;  [facility];  Service: Endoscopy;;  . BREAST REDUCTION SURGERY    . CARDIAC CATHETERIZATION  2011   5 frech sheath  . COLONOSCOPY N/A 10/14/2012   [id number] rectum and colon  . COLONOSCOPY WITH PROPOFOL N/A 05/27/2020   normal  . CYST  EXCISION Right 08/08/2016   Procedure: EXCISION OF RIGHT INGUINAL SUBCUTANEOUS MASS (LIKELY SEBACEIYS CYST);  Surgeon: Vickie Epley, MD;  Location: [facility] ORS;  Service: General;  Laterality: Right;  7cm x 4cm mass; dermabond dressing  . ESOPHAGOGASTRODUODENOSCOPY (EGD) WITH  PROPOFOL N/A 05/27/2020   Grade 1-2 esophageal varices, possible Barrett's s/p biopsy (negative Barretts), portal gastropathy. Gastropathy could be source of occult GI bleeding. Negative H.pylori.  Marland Kitchen FLEXIBLE SIGMOIDOSCOPY N/A 10/15/2013   markedly inflamed distal rectum with ulceration, rectal polyp benign. Acute ulceration of rectum.   . h/o cardiac cath    . LAPAROSCOPIC GASTRIC SLEEVE RESECTION  06/2012   Dr Mickie Bail [facility])    FAMILY HISTORY: Family History  Problem Relation Age of Onset  . Heart attack Father   . Colon cancer Paternal Grandmother   . Brain cancer Brother 89    SOCIAL HISTORY: Social History   Socioeconomic History  . Marital status: Divorced    Spouse name: Not on file  . Number of children: 2  . Years of education: Not on file  . Highest education level: Not on file  Occupational History  . 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 Smoking status: Current Every Day Smoker    Packs/day: 0.25    Years: 36.00    Pack years: 9.00    Types: Cigarettes  . Smokeless tobacco: Never Used  Vaping Use  . Vaping Use: Never used  Substance and Sexual Activity  . Alcohol use: Yes    Alcohol/week: 0.0 standard drinks    Comment: occasionally   . Drug use: No  . Sexual activity: Yes    Partners: Male  Other Topics Concern  . Not on file  Social History Narrative   Moving to [place] to care for brother w/ brain tumor   Lives w/ 2 daughters & 33 grandsons   Social Determinants of Health   Financial Resource Strain: Not on file  Food Insecurity: Not on file  Transportation Needs: Not on file  Physical Activity: Not on file  Stress: Not on file  Social Connections:  Not on file  Intimate Partner Violence: Not on file      PHYSICAL EXAM Generalized: Well developed, in no acute distress   Neurological examination  Mentation: Alert oriented to time, place, history taking. Follows all commands speech and language fluent Cranial nerve II-XII:Extraocular movements were full. Facial symmetry noted. uvula tongue midline. Head turning and shoulder shrug  were normal and symmetric. Motor: Good strength throughout subjectively per patient Sensory: Sensory testing is intact to soft touch on all 4 extremities subjectively per patient Coordination: Cerebellar testing reveals good finger-nose-finger  Gait and station: Patient is able to stand from a seated position. gait is normal.  Reflexes: UTA  DIAGNOSTIC DATA (LABS, IMAGING, TESTING) - I reviewed patient records, labs, notes, testing and imaging myself where available.  Lab Results  Component Value Date   WBC 5.4 11/16/2020   HGB 16.5 11/16/2020   HCT 48.5 11/16/2020   MCV 92.7 11/16/2020   PLT 123 (L) 11/16/2020      Component Value Date/Time   NA 138 11/16/2020 1405   K 3.9 11/16/2020 1405   CL 102 11/16/2020 1405   CO2 30 11/16/2020 1405   GLUCOSE 79 11/16/2020 1405   BUN 11 11/16/2020 1405   CREATININE 0.84 11/16/2020 1405   CALCIUM 9.3 11/16/2020 1405   PROT 6.4 11/16/2020 1405   ALBUMIN 4.4 07/27/2013 1128   AST 15 11/16/2020 1405   ALT 13 11/16/2020 1405   ALKPHOS 87 07/27/2013 1128   BILITOT 0.7 11/16/2020 1405   GFRNONAA 97 11/16/2020 1405   GFRAA 112 11/16/2020 1405    Lab Results  Component Value Date   TSH  1.20 11/29/2020      ASSESSMENT AND PLAN 60 y.o. year old male  has a past medical history of Anxiety, DJD (degenerative joint disease), H/O cardiovascular stress test (2013), H/O echocardiogram (2012), History of cardiac catheterization, Hypertension, Obesity, Spinal stenosis, and Thrombocytopenia ([place]). here with:  OSA on CPAP  . CPAP compliance  excellent . Residual AHI is good . Encouraged patient to continue using CPAP nightly and > 4 hours each night . F/U in 6 months or sooner if needed  Ward Givens, MSN, NP-C 12/21/2020, 1:25 PM [facility] [address] [place], [place] [address] [phone]

## 2020-12-28 ENCOUNTER — Other Ambulatory Visit: Payer: Self-pay

## 2020-12-28 ENCOUNTER — Ambulatory Visit
Admission: EM | Admit: 2020-12-28 | Discharge: 2020-12-28 | Disposition: A | Discharge status: Home / Self Care | Payer: Medicare HMO | Attending: Internal Medicine | Admitting: Internal Medicine

## 2020-12-28 ENCOUNTER — Encounter: Payer: Self-pay | Admitting: Emergency Medicine

## 2020-12-28 DIAGNOSIS — B9789 Other viral agents as the cause of diseases classified elsewhere: Secondary | ICD-10-CM | POA: Diagnosis not present

## 2020-12-28 DIAGNOSIS — J019 Acute sinusitis, unspecified: Secondary | ICD-10-CM | POA: Diagnosis not present

## 2020-12-28 MED ORDER — METHYLPREDNISOLONE 4 MG PO TBPK: ORAL_TABLET | ORAL | 0 refills | Status: DC

## 2020-12-28 MED ORDER — AMOXICILLIN-POT CLAVULANATE 875-125 MG PO TABS: 1.0000 | ORAL_TABLET | Freq: Two times a day (BID) | ORAL | 0 refills | Status: DC

## 2020-12-28 NOTE — ED Triage Notes (Signed)
Headache on Sunday and Monday.  Slight fever, took covid test that was negative.  While leaning over to get clothes from dryer, whole face started hurting.  Teeth pain and jaw pain.  States nasal drainage is very thick and white.

## 2020-12-28 NOTE — Discharge Instructions (Addendum)
I believe you have viral sinusitis. But could be early bacterial since you have one side sinus pain on your face  Do Netie Pot saline rinses twice a day for 5-7 days And if after 3 days with the prednisone regimen, if not better then start the antibiotic.

## 2020-12-28 NOTE — ED Provider Notes (Signed)
[facility]    CSN: [id number] Arrival date & time: 12/28/20  1541      History   Chief Complaint No chief complaint on file.   HPI Jordie Schreur. is a 60 y.o. male who 9 days ago had temp of 99.7 and HA and for 2 days. Had a lot of sneezing around that time. Had 2 neg Covid tests. He then 2 days ago leaned over at the dryer and felt L face pressure, yesterday has L upper teeth pain and jaw pain. Has nose drainage is thick and white.  Has been using Flonase bid , taking Zyrtec qd, and using his saline mist. No more fever since initially. He does not feel well and is prone to getting sinusitis and his PCP could not see him. Never had sinus surgeries. Tapping his sinuses provoked pain   Past Medical History:  Diagnosis Date  . Anxiety   . DJD (degenerative joint disease)   . H/O cardiovascular stress test 2013  . H/O echocardiogram 2012   for cad, htn & obesity  . History of cardiac catheterization    a. normal cors by cath in 2011  . Hypertension   . Obesity   . Spinal stenosis   . Thrombocytopenia [facility])     Patient Active Problem List   Diagnosis Date Noted  . IDA (iron deficiency anemia) 05/04/2020  . Thrombocytopenia ([place]) 05/04/2020  . Cervico-occipital neuralgia 03/17/2020  . History of sleep apnea 02/20/2020  . Sleep disturbance 02/20/2020  . Chronic pain due to injury 05/26/2019  . History of bilevel positive airway pressure (BiPAP) therapy 05/26/2019  . Occipital headache 05/26/2019  . 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 H/O gastric bypass 10/18/2015  . Hypersomnia with sleep apnea 10/18/2015  . Sleep talking 10/18/2015  . Sleep walking and eating 10/18/2015  . Varicose veins of leg with complications 68/06/7516  . Rectal pain 10/10/2013  . Rectal bleeding 10/10/2013  . Constipation 10/10/2013  . Fecal incontinence 10/10/2013  . Heme positive stool 10/10/2013  . OSA on CPAP 08/25/2013  . HIV exposure  07/29/2013  . Encounter for screening colonoscopy 10/10/2012  . Hemorrhoids 10/10/2012    Past Surgical History:  Procedure Laterality Date  . BACK SURGERY     x 4  . BIOPSY  05/27/2020   Procedure: BIOPSY;  Surgeon: Daneil Dolin, MD;  [facility];  Service: Endoscopy;;  . BREAST REDUCTION SURGERY    . CARDIAC CATHETERIZATION  2011   5 frech sheath  . COLONOSCOPY N/A 10/14/2012   GYF:VCBSWH rectum and colon  . COLONOSCOPY WITH PROPOFOL N/A 05/27/2020   normal  . CYST EXCISION Right 08/08/2016   Procedure: EXCISION OF RIGHT INGUINAL SUBCUTANEOUS MASS (LIKELY SEBACEIYS CYST);  Surgeon: Vickie Epley, MD;  Location: [facility];  Service: General;  Laterality: Right;  7cm x 4cm mass; dermabond dressing  . ESOPHAGOGASTRODUODENOSCOPY (EGD) WITH PROPOFOL N/A 05/27/2020   Grade 1-2 esophageal varices, possible Barrett's s/p biopsy (negative Barretts), portal gastropathy. Gastropathy could be source of occult GI bleeding. Negative H.pylori.  Marland Kitchen FLEXIBLE SIGMOIDOSCOPY N/A 10/15/2013   markedly inflamed distal rectum with ulceration, rectal polyp benign. Acute ulceration of rectum.   . h/o cardiac cath    . LAPAROSCOPIC GASTRIC SLEEVE RESECTION  06/2012   Dr Mickie Bail [facility])       Home Medications    Prior to Admission medications   Medication Sig Start Date End Date Taking? Authorizing Provider  amoxicillin-clavulanate (AUGMENTIN) 875-125 MG tablet Take 1 tablet by mouth 2 (two) times daily. 12/28/20  Yes Rodriguez-Southworth, Sunday Spillers, PA-C  methylPREDNISolone (MEDROL DOSEPAK) 4 MG TBPK tablet Take as directed 12/28/20  Yes Rodriguez-Southworth, Sunday Spillers, PA-C  acetaminophen (TYLENOL) 500 MG tablet Take 1,000 mg by mouth 2 (two) times daily as needed for moderate pain or headache.     [provider]  ascorbic acid (VITAMIN C) 500 MG tablet Take 500 mg by mouth daily.    [provider]  B COMPLEX VITAMINS SL Place 1 Dose under the tongue daily. Liquid    [provider]  bisacodyl (DULCOLAX) 5 MG EC tablet Take 10 mg by mouth at bedtime.    [provider]  Calcium Carb-Cholecalciferol (CALCIUM/VITAMIN D PO) Take 1 tablet by mouth daily.    [provider]  cetirizine (ZYRTEC) 10 MG tablet Take 10 mg by mouth as needed for allergies.    [provider]  clonazePAM (KLONOPIN) 1 MG tablet Take 1 mg by mouth 2 (two) times daily.  04/20/14   [provider]  cyclobenzaprine (FLEXERIL) 5 MG tablet Take 1 tablet (5 mg total) by mouth 3 (three) times daily as needed for muscle spasms. Only as needed. Patient taking differently: Take 5 mg by mouth at bedtime. . 06/05/19   Dohmeier, Asencion Partridge, MD  docusate sodium (COLACE) 100 MG capsule Take 300 mg by mouth at bedtime.    [provider]  ferrous sulfate 325 (65 FE) MG tablet Take 325 mg by mouth daily with breakfast.    [provider]  Flaxseed, Linseed, (FLAX SEED OIL) 1000 MG CAPS Take 1,000 mg by mouth daily.     [provider]  fluticasone (FLONASE) 50 MCG/ACT nasal spray Place 2 sprays into both nostrils daily as needed for congestion. 07/07/16   [provider]  HYDROcodone-acetaminophen (NORCO/VICODIN) 5-325 MG tablet Take 1 tablet by mouth every 6 (six) hours as needed for moderate pain.    [provider]  losartan (COZAAR) 25 MG tablet Take 0.5 tablets (12.5 mg total) by mouth daily. 01/02/19   Strader, Fransisco Hertz, PA-C  lubiprostone (AMITIZA) 24 MCG capsule TAKE 1 CAPSULE BY MOUTH TWICE DAILY WITH FOOD Patient taking differently: Take 24 mcg by mouth daily with breakfast. 05/04/20   Annitta Needs, NP  Multiple Vitamin (MULTIVITAMIN WITH MINERALS) TABS tablet Take 1 tablet by mouth daily.    [provider]  omeprazole (PRILOSEC) 20 MG capsule Take 20 mg by mouth at bedtime. 09/23/12   [provider]  pravastatin (PRAVACHOL) 40 MG tablet Take 40 mg by mouth daily.    [provider]  sertraline  (ZOLOFT) 100 MG tablet Take 100 mg by mouth daily. 09/27/12   [provider]  zinc gluconate 50 MG tablet Take 25 mg by mouth daily.     [provider]    Family History Family History  Problem Relation Age of Onset  . Heart attack Father   . Colon cancer Paternal Grandmother   . Brain cancer Brother 97    Social History Social History   Tobacco Use  . Smoking status: Current Every Day Smoker    Packs/day: 0.25    Years: 36.00    Pack years: 9.00    Types: Cigarettes  . Smokeless tobacco: Never Used  Vaping Use  . Vaping Use: Never used  Substance Use Topics  . Alcohol use: Yes    Alcohol/week: 0.0 standard drinks  Comment: occasionally   . Drug use: No     Allergies   Aspirin, Codeine, and Nsaids   Review of Systems Review of Systems  Constitutional: Positive for fatigue. Negative for activity change, appetite change and fever.  HENT: Positive for postnasal drip, sinus pressure and sinus pain. Negative for dental problem, ear discharge, ear pain and facial swelling.   Eyes: Negative for discharge.  Respiratory: Negative for cough.   Musculoskeletal: Positive for back pain and neck pain.       Has chronic neck and back pain  Skin: Negative for rash.  Neurological: Positive for headaches.  Hematological: Negative for adenopathy.     Physical Exam Triage Vital Signs ED Triage Vitals  Enc Vitals Group     BP 12/28/20 1609 131/81     Pulse Rate 12/28/20 1609 72     Resp 12/28/20 1609 18     Temp 12/28/20 1609 98.1 F (36.7 C)     Temp Source 12/28/20 1609 Oral     SpO2 12/28/20 1609 97 %     Weight --      Height --      Head Circumference --      Peak Flow --      Pain Score 12/28/20 1613 6     Pain Loc --      Pain Edu? --      Excl. in [place]? --    No data found.  Updated Vital Signs BP 131/81 (BP Location: Right Arm)   Pulse 72   Temp 98.1 F (36.7 C) (Oral)   Resp 18   SpO2 97%   Visual Acuity Right Eye Distance:    Left Eye Distance:   Bilateral Distance:    Right Eye Near:   Left Eye Near:    Bilateral Near:     Physical Exam Vitals and nursing note reviewed.  Constitutional:      General: He is not in acute distress.    Appearance: He is not toxic-appearing.  HENT:     Head: Normocephalic.     Right Ear: Tympanic membrane, ear canal and external ear normal.     Left Ear: Tympanic membrane, ear canal and external ear normal.     Nose: Congestion present.     Comments: L ethmoid and maxillary sinuses are tender, L maxillary sinus has decreased  translumination on L maxillary sinus    Mouth/Throat:     Mouth: Mucous membranes are moist.     Pharynx: Oropharynx is clear.  Eyes:     General: No scleral icterus.    Conjunctiva/sclera: Conjunctivae normal.  Cardiovascular:     Rate and Rhythm: Normal rate and regular rhythm.     Heart sounds: No murmur heard.   Pulmonary:     Effort: Pulmonary effort is normal.     Breath sounds: Normal breath sounds.  Musculoskeletal:        General: Normal range of motion.     Cervical back: Neck supple.  Skin:    General: Skin is warm and dry.     Findings: No rash.  Neurological:     Mental Status: He is alert and oriented to person, place, and time.     Comments: Uses a cane to walk  Psychiatric:        Mood and Affect: Mood normal.        Behavior: Behavior normal.        Thought Content: Thought content normal.  Judgment: Judgment normal.      UC Treatments / Results  Labs (all labs ordered are listed, but only abnormal results are displayed) Labs Reviewed - No data to display  EKG   Radiology No results found.  Procedures Procedures (including critical care time)  Medications Ordered in UC Medications - No data to display  Initial Impression / Assessment and Plan / UC Course  I have reviewed the triage vital signs and the nursing notes.  Has viral sinusitis, but may be getting early L maxillary sinusitis.  He  would rather not take antibiotic if not needed. So he will start with Netie Pot saline rinses twice a day and Medrol dose pack. If this does not help in 3 days, then will start the Augmentin.  Final Clinical Impressions(s) / UC Diagnoses   Final diagnoses:  Acute viral sinusitis     Discharge Instructions     I believe you have viral sinusitis. But could be early bacterial since you have one side sinus pain on your face  Do Netie Pot saline rinses twice a day for 5-7 days And if after 3 days with the prednisone regimen, if not better then start the antibiotic.     ED Prescriptions    Medication Sig Dispense Auth. Provider   amoxicillin-clavulanate (AUGMENTIN) 875-125 MG tablet Take 1 tablet by mouth 2 (two) times daily. 20 tablet Rodriguez-Southworth, Sadeel Fiddler, PA-C   methylPREDNISolone (MEDROL DOSEPAK) 4 MG TBPK tablet Take as directed 21 tablet Rodriguez-Southworth, Sunday Spillers, PA-C     PDMP not reviewed this encounter.   Shelby Mattocks, Hershal Coria 12/29/20 2146

## 2021-01-03 DIAGNOSIS — Z9884 Bariatric surgery status: Secondary | ICD-10-CM | POA: Diagnosis not present

## 2021-01-03 DIAGNOSIS — I10 Essential (primary) hypertension: Secondary | ICD-10-CM | POA: Diagnosis not present

## 2021-01-03 DIAGNOSIS — D696 Thrombocytopenia, unspecified: Secondary | ICD-10-CM | POA: Diagnosis not present

## 2021-01-03 DIAGNOSIS — Z903 Acquired absence of stomach [part of]: Secondary | ICD-10-CM | POA: Diagnosis not present

## 2021-01-03 DIAGNOSIS — Z79899 Other long term (current) drug therapy: Secondary | ICD-10-CM | POA: Diagnosis not present

## 2021-01-03 DIAGNOSIS — E611 Iron deficiency: Secondary | ICD-10-CM | POA: Insufficient documentation

## 2021-01-03 DIAGNOSIS — E785 Hyperlipidemia, unspecified: Secondary | ICD-10-CM | POA: Diagnosis not present

## 2021-01-06 DIAGNOSIS — G4733 Obstructive sleep apnea (adult) (pediatric): Secondary | ICD-10-CM | POA: Diagnosis not present

## 2021-01-10 DIAGNOSIS — G4733 Obstructive sleep apnea (adult) (pediatric): Secondary | ICD-10-CM | POA: Diagnosis not present

## 2021-01-11 DIAGNOSIS — G894 Chronic pain syndrome: Secondary | ICD-10-CM | POA: Diagnosis not present

## 2021-01-11 DIAGNOSIS — M5412 Radiculopathy, cervical region: Secondary | ICD-10-CM | POA: Diagnosis not present

## 2021-01-11 DIAGNOSIS — D696 Thrombocytopenia, unspecified: Secondary | ICD-10-CM | POA: Diagnosis not present

## 2021-01-11 DIAGNOSIS — M533 Sacrococcygeal disorders, not elsewhere classified: Secondary | ICD-10-CM | POA: Diagnosis not present

## 2021-01-11 DIAGNOSIS — M5416 Radiculopathy, lumbar region: Secondary | ICD-10-CM | POA: Diagnosis not present

## 2021-01-11 DIAGNOSIS — M542 Cervicalgia: Secondary | ICD-10-CM | POA: Diagnosis not present

## 2021-01-11 DIAGNOSIS — Z5181 Encounter for therapeutic drug level monitoring: Secondary | ICD-10-CM | POA: Diagnosis not present

## 2021-01-11 DIAGNOSIS — M47812 Spondylosis without myelopathy or radiculopathy, cervical region: Secondary | ICD-10-CM | POA: Diagnosis not present

## 2021-01-11 DIAGNOSIS — M549 Dorsalgia, unspecified: Secondary | ICD-10-CM | POA: Diagnosis not present

## 2021-01-12 ENCOUNTER — Ambulatory Visit (HOSPITAL_COMMUNITY)
Admission: RE | Admit: 2021-01-12 | Discharge: 2021-01-12 | Disposition: A | Discharge status: Home / Self Care | Payer: Medicare HMO | Attending: Internal Medicine | Admitting: Internal Medicine

## 2021-01-12 ENCOUNTER — Encounter (HOSPITAL_COMMUNITY)
Admission: RE | Disposition: A | Discharge status: Home / Self Care | Payer: Self-pay | Source: Home / Self Care | Attending: Internal Medicine

## 2021-01-12 DIAGNOSIS — D509 Iron deficiency anemia, unspecified: Secondary | ICD-10-CM | POA: Diagnosis not present

## 2021-01-12 DIAGNOSIS — D500 Iron deficiency anemia secondary to blood loss (chronic): Secondary | ICD-10-CM | POA: Diagnosis not present

## 2021-01-12 HISTORY — PX: GIVENS CAPSULE STUDY: SHX5432

## 2021-01-12 SURGERY — IMAGING PROCEDURE, GI TRACT, INTRALUMINAL, VIA CAPSULE

## 2021-01-17 ENCOUNTER — Encounter (HOSPITAL_COMMUNITY): Payer: Self-pay | Admitting: Internal Medicine

## 2021-01-18 ENCOUNTER — Ambulatory Visit: Payer: Medicare HMO | Admitting: Gastroenterology

## 2021-01-19 ENCOUNTER — Encounter: Payer: Self-pay | Admitting: Gastroenterology

## 2021-01-19 DIAGNOSIS — H6123 Impacted cerumen, bilateral: Secondary | ICD-10-CM | POA: Diagnosis not present

## 2021-01-19 DIAGNOSIS — D500 Iron deficiency anemia secondary to blood loss (chronic): Secondary | ICD-10-CM

## 2021-01-20 ENCOUNTER — Ambulatory Visit (HOSPITAL_COMMUNITY)
Admission: RE | Admit: 2021-01-20 | Discharge: 2021-01-20 | Disposition: A | Discharge status: Home / Self Care | Payer: Medicare HMO | Source: Ambulatory Visit | Attending: Gastroenterology | Admitting: Gastroenterology

## 2021-01-20 ENCOUNTER — Other Ambulatory Visit: Payer: Self-pay

## 2021-01-20 DIAGNOSIS — K8020 Calculus of gallbladder without cholecystitis without obstruction: Secondary | ICD-10-CM | POA: Diagnosis not present

## 2021-01-20 DIAGNOSIS — E7849 Other hyperlipidemia: Secondary | ICD-10-CM | POA: Diagnosis not present

## 2021-01-20 DIAGNOSIS — D500 Iron deficiency anemia secondary to blood loss (chronic): Secondary | ICD-10-CM | POA: Diagnosis not present

## 2021-01-20 DIAGNOSIS — Z9889 Other specified postprocedural states: Secondary | ICD-10-CM | POA: Diagnosis not present

## 2021-01-20 DIAGNOSIS — I10 Essential (primary) hypertension: Secondary | ICD-10-CM | POA: Diagnosis not present

## 2021-01-21 DIAGNOSIS — E611 Iron deficiency: Secondary | ICD-10-CM | POA: Diagnosis not present

## 2021-01-26 NOTE — Procedures (Addendum)
Small Bowel [place] Capsule Study Procedure date:  01/12/21  Referring Provider:  Roseanne Kaufman, NP PCP:  Dr. Redmond School, MD  Indication for procedure:   60 year old male presenting with history of IDA, s/p colonoscopy and EGD completed Nov 2021. EGD with Grade 1-2 esophageal varices, portal gastropathy, and colonoscopy normal. Capsule study now to conclude evaluation for IDA.     Findings:   Adequate prep. Capsule was not able to be confirmed to reach the cecum. No obvious mass. Portal gastropathy noted. Scattered throughout small bowel appeared to be non-bleeding erosions. No obvious ulcerations, mass, polyps, lesions.   First Gastric image:  00:01:13 First Duodenal image: 00:32:05 First Cecal image: Unable to identify Gastric Passage time: 0h 58m Small Bowel Passage time:  Unable to calculate small bowel passage time as did not reach the cecum.   Summary & Recommendations: Pleasant 60 year old male with history of IDA likely multifactorial, with contributors including portal gastropathy and scattered erosions. NSAID use has been sparingly but needs to be avoided. Recommend continued close follow-up with Hematology, avoidance of NSAIDs, and follow-up as planned with GI.   Abdominal xray was completed and on file; capsule study has passed. I also reviewed this with the radiologist.   Annitta Needs, PhD, ANP-BC [facility]   Attending note:  pertinent images reviewed; agree with above assessment.

## 2021-01-28 DIAGNOSIS — M5416 Radiculopathy, lumbar region: Secondary | ICD-10-CM | POA: Diagnosis not present

## 2021-02-05 DIAGNOSIS — G4733 Obstructive sleep apnea (adult) (pediatric): Secondary | ICD-10-CM | POA: Diagnosis not present

## 2021-02-09 DIAGNOSIS — G4733 Obstructive sleep apnea (adult) (pediatric): Secondary | ICD-10-CM | POA: Diagnosis not present

## 2021-02-17 DIAGNOSIS — E663 Overweight: Secondary | ICD-10-CM | POA: Diagnosis not present

## 2021-02-17 DIAGNOSIS — M48062 Spinal stenosis, lumbar region with neurogenic claudication: Secondary | ICD-10-CM | POA: Diagnosis not present

## 2021-02-17 DIAGNOSIS — I10 Essential (primary) hypertension: Secondary | ICD-10-CM | POA: Diagnosis not present

## 2021-02-17 DIAGNOSIS — I8392 Asymptomatic varicose veins of left lower extremity: Secondary | ICD-10-CM | POA: Diagnosis not present

## 2021-02-17 DIAGNOSIS — Z6828 Body mass index (BMI) 28.0-28.9, adult: Secondary | ICD-10-CM | POA: Diagnosis not present

## 2021-02-17 DIAGNOSIS — R69 Illness, unspecified: Secondary | ICD-10-CM | POA: Diagnosis not present

## 2021-02-17 DIAGNOSIS — E7849 Other hyperlipidemia: Secondary | ICD-10-CM | POA: Diagnosis not present

## 2021-02-18 DIAGNOSIS — E611 Iron deficiency: Secondary | ICD-10-CM | POA: Diagnosis not present

## 2021-02-20 DIAGNOSIS — I10 Essential (primary) hypertension: Secondary | ICD-10-CM | POA: Diagnosis not present

## 2021-02-20 DIAGNOSIS — E7849 Other hyperlipidemia: Secondary | ICD-10-CM | POA: Diagnosis not present

## 2021-03-02 ENCOUNTER — Telehealth: Payer: Self-pay | Admitting: Internal Medicine

## 2021-03-02 NOTE — Telephone Encounter (Signed)
PATIENT DROPPED OFF SOME FORMS THAT NEED TO BE FILLED OUT FOR HIS INSURANCE AND THEY ARE IN THE BOX ON YOUR OFFICE WALL

## 2021-03-03 NOTE — Telephone Encounter (Signed)
I have completed and awaiting patient to update on if needing to be faxed or he will pick up.

## 2021-03-11 ENCOUNTER — Other Ambulatory Visit: Payer: Self-pay

## 2021-03-11 DIAGNOSIS — I8392 Asymptomatic varicose veins of left lower extremity: Secondary | ICD-10-CM

## 2021-03-16 DIAGNOSIS — F418 Other specified anxiety disorders: Secondary | ICD-10-CM | POA: Diagnosis not present

## 2021-03-16 DIAGNOSIS — R69 Illness, unspecified: Secondary | ICD-10-CM | POA: Diagnosis not present

## 2021-03-23 ENCOUNTER — Encounter: Payer: Self-pay | Admitting: Vascular Surgery

## 2021-03-23 ENCOUNTER — Ambulatory Visit (HOSPITAL_COMMUNITY)
Admission: RE | Admit: 2021-03-23 | Discharge: 2021-03-23 | Disposition: A | Discharge status: Home / Self Care | Payer: Medicare HMO | Source: Ambulatory Visit | Attending: Vascular Surgery | Admitting: Vascular Surgery

## 2021-03-23 ENCOUNTER — Ambulatory Visit: Payer: Medicare HMO | Admitting: Vascular Surgery

## 2021-03-23 ENCOUNTER — Other Ambulatory Visit: Payer: Self-pay

## 2021-03-23 VITALS — BP 106/73 | HR 75 | Temp 98.1°F | Resp 20 | Ht 72.0 in | Wt 195.0 lb

## 2021-03-23 DIAGNOSIS — I8392 Asymptomatic varicose veins of left lower extremity: Secondary | ICD-10-CM

## 2021-03-23 DIAGNOSIS — E7849 Other hyperlipidemia: Secondary | ICD-10-CM | POA: Diagnosis not present

## 2021-03-23 DIAGNOSIS — I10 Essential (primary) hypertension: Secondary | ICD-10-CM | POA: Diagnosis not present

## 2021-03-23 NOTE — Progress Notes (Signed)
Patient ID: Jerome [name]., male   DOB: May 30, 1961, 60 y.o.   MRN: [id number]  Reason for Consult: Follow-up   Referred by Redmond School, MD  Subjective:     HPI:  Jerome [name]. is a 60 y.o. male history of left greater saphenous ablation 2016.  Multiple stab avulsions at that time.  Recently saw Dr. Oneida Alar 2019 considered for stab avulsions at that time.  He did get placed in thigh-high stockings at that time.  He states that this did help.  He complains of toothache type pain in his left leg below the knee.  He does have significant back pain as well as neuropathy.  He has significant numbness in the leg below the knee.  He has not had any bleeding in his varicosities.  No history of thrombosis.  Denies any personal or family history of DVT.  He does not have any exacerbating factors.  He has not recently worn compression stockings.  He does not have tissue loss or ulceration or skin changes.  Past Medical History:  Diagnosis Date   Anxiety    DJD (degenerative joint disease)    H/O cardiovascular stress test 2013   H/O echocardiogram 2012   for cad, htn & obesity   History of cardiac catheterization    a. normal cors by cath in 2011   Hypertension    Obesity    Spinal stenosis    Thrombocytopenia ([place])    Family History  Problem Relation Age of Onset   Heart attack Father    Colon cancer Paternal Grandmother    Brain cancer Brother 59   Past Surgical History:  Procedure Laterality Date   BACK SURGERY     x 4   BIOPSY  05/27/2020   Procedure: BIOPSY;  Surgeon: Daneil Dolin, MD;  [facility];  Service: Endoscopy;;   BREAST REDUCTION SURGERY     CARDIAC CATHETERIZATION  2011   5 frech sheath   COLONOSCOPY N/A 10/14/2012   [id number] rectum and colon   COLONOSCOPY WITH PROPOFOL N/A 05/27/2020   normal   CYST EXCISION Right 08/08/2016   Procedure: EXCISION OF RIGHT INGUINAL SUBCUTANEOUS MASS (LIKELY SEBACEIYS CYST);  Surgeon: Vickie Epley, MD;   Location: [facility];  Service: General;  Laterality: Right;  7cm x 4cm mass; dermabond dressing   ESOPHAGOGASTRODUODENOSCOPY (EGD) WITH PROPOFOL N/A 05/27/2020   Grade 1-2 esophageal varices, possible Barrett's s/p biopsy (negative Barretts), portal gastropathy. Gastropathy could be source of occult GI bleeding. Negative H.pylori.   FLEXIBLE SIGMOIDOSCOPY N/A 10/15/2013   markedly inflamed distal rectum with ulceration, rectal polyp benign. Acute ulceration of rectum.    GIVENS CAPSULE STUDY N/A 01/12/2021   Procedure: GIVENS CAPSULE STUDY;  Surgeon: Daneil Dolin, MD;  [facility];  Service: Endoscopy;  Laterality: N/A;  8:30am   h/o cardiac cath     LAPAROSCOPIC GASTRIC SLEEVE RESECTION  06/2012   Dr Mickie Bail [facility])    Short Social History:  Social History   Tobacco Use   Smoking status: Every Day    Packs/day: 0.25    Years: 36.00    Pack years: 9.00    Types: Cigarettes   Smokeless tobacco: Never  Substance Use Topics   Alcohol use: Yes    Alcohol/week: 0.0 standard drinks    Comment: occasionally     Allergies  Allergen Reactions   Aspirin Nausea And Vomiting    Due to gastric sleeve surgery  Codeine Nausea And Vomiting   Nsaids Anxiety    Due to gastric sleeve surgery  N/V    Current Outpatient Medications  Medication Sig Dispense Refill   acetaminophen (TYLENOL) 500 MG tablet Take 1,000 mg by mouth 2 (two) times daily as needed for moderate pain or headache.      ascorbic acid (VITAMIN C) 500 MG tablet Take 500 mg by mouth daily.     B COMPLEX VITAMINS SL Place 1 Dose under the tongue daily. Liquid     bisacodyl (DULCOLAX) 5 MG EC tablet Take 10 mg by mouth at bedtime.     Calcium Carb-Cholecalciferol (CALCIUM/VITAMIN D PO) Take 1 tablet by mouth daily.     cetirizine (ZYRTEC) 10 MG tablet Take 10 mg by mouth as needed for allergies.     clonazePAM (KLONOPIN) 1 MG tablet Take 1 mg by mouth 2 (two) times daily.      cyclobenzaprine (FLEXERIL) 5 MG tablet  Take 1 tablet (5 mg total) by mouth 3 (three) times daily as needed for muscle spasms. Only as needed. (Patient taking differently: Take 5 mg by mouth at bedtime. Marland Kitchen) 30 tablet 1   docusate sodium (COLACE) 100 MG capsule Take 300 mg by mouth at bedtime.     ferrous sulfate 325 (65 FE) MG tablet Take 325 mg by mouth daily with breakfast.     Flaxseed, Linseed, (FLAX SEED OIL) 1000 MG CAPS Take 1,000 mg by mouth daily.      fluticasone (FLONASE) 50 MCG/ACT nasal spray Place 2 sprays into both nostrils daily as needed for congestion.  5   HYDROcodone-acetaminophen (NORCO/VICODIN) 5-325 MG tablet Take 1 tablet by mouth every 6 (six) hours as needed for moderate pain.     losartan (COZAAR) 25 MG tablet Take 0.5 tablets (12.5 mg total) by mouth daily. 45 tablet 3   lubiprostone (AMITIZA) 24 MCG capsule TAKE 1 CAPSULE BY MOUTH TWICE DAILY WITH FOOD (Patient taking differently: Take 24 mcg by mouth daily with breakfast.) 60 capsule 5   methylPREDNISolone (MEDROL DOSEPAK) 4 MG TBPK tablet Take as directed 21 tablet 0   Multiple Vitamin (MULTIVITAMIN WITH MINERALS) TABS tablet Take 1 tablet by mouth daily.     omeprazole (PRILOSEC) 20 MG capsule Take 20 mg by mouth at bedtime.     pravastatin (PRAVACHOL) 40 MG tablet Take 40 mg by mouth daily.     sertraline (ZOLOFT) 100 MG tablet Take 100 mg by mouth daily.     zinc gluconate 50 MG tablet Take 25 mg by mouth daily.      No current facility-administered medications for this visit.    Review of Systems  Constitutional:  Constitutional negative. HENT: HENT negative.  Eyes: Eyes negative.  Respiratory: Respiratory negative.  Cardiovascular: Cardiovascular negative.  GI: Gastrointestinal negative.  Musculoskeletal: Positive for back pain and leg pain.  Skin: Skin negative.  Neurological: Neurological negative. Hematologic: Hematologic/lymphatic negative.  Psychiatric: Psychiatric negative.       Objective:  Objective   Vitals:   03/23/21 1142   BP: 106/73  Pulse: 75  Resp: 20  Temp: 98.1 F (36.7 C)  SpO2: 94%  Weight: 195 lb (88.5 kg)  Height: 6' (1.829 m)   Body mass index is 26.45 kg/m.  Physical Exam HENT:     Head: Normocephalic.     Nose:     Comments: Wearing a mask Eyes:     Pupils: Pupils are equal, round, and reactive to light.  Cardiovascular:  Rate and Rhythm: Normal rate.  Pulmonary:     Effort: Pulmonary effort is normal.  Abdominal:     General: Abdomen is flat.     Palpations: Abdomen is soft.  Musculoskeletal:        General: Normal range of motion.     Comments: There are clustered varicosities of the left leg on the medial calf  Skin:    Capillary Refill: Capillary refill takes less than 2 seconds.  Neurological:     General: No focal deficit present.     Mental Status: He is alert.  Psychiatric:        Mood and Affect: Mood normal.        Behavior: Behavior normal.        Thought Content: Thought content normal.        Judgment: Judgment normal.    Data: +------------------+---------+------+-----------+------------+-------------  ----+  LEFT              Reflux NoRefluxReflux TimeDiameter cmsComments                                        Yes                                             +------------------+---------+------+-----------+------------+-------------  ----+  CFV               no                                                        +------------------+---------+------+-----------+------------+-------------  ----+  FV mid            no                                    bifid  femoral                                                              vein                +------------------+---------+------+-----------+------------+-------------  ----+  Popliteal         no                                                        +------------------+---------+------+-----------+------------+-------------  ----+  GSV at [facility]         no                            0.62                        +------------------+---------+------+-----------+------------+-------------  ----+  GSV prox thigh                                          prior                                                                          ablation/strippin                                                          g                    +------------------+---------+------+-----------+------------+-------------  ----+  GSV mid thigh                                           prior                                                                          ablation/strippin                                                          g                    +------------------+---------+------+-----------+------------+-------------  ----+  GSV dist thigh                                          prior                                                                          ablation/strippin                                                          g                    +------------------+---------+------+-----------+------------+-------------  ----+  GSV at knee                                             prior                                                                          ablation/strippin                                                          g                    +------------------+---------+------+-----------+------------+-------------  ----+  GSV prox calf     no                            0.45                        +------------------+---------+------+-----------+------------+-------------  ----+  SSV Pop Fossa     no                            0.35                        +------------------+---------+------+-----------+------------+-------------  ----+  anterior accessoryno                            0.29                         +------------------+---------+------+-----------+------------+-------------  ----+       Summary:  Left:  - No evidence of deep vein thrombosis from the common femoral through the  popliteal veins.  - No evidence of superficial venous thrombosis.  - The deep venous system is competent.  - The great saphenous vein was successfully ablated.  - The small saphenous vein is competent.         Assessment/Plan:     60 year old male with previous saphenous vein ablation which was successful left lower extremity as well as stab phlebectomy.  He presents with pain surrounding varicosities that his tooth ache type in nature really moderate in intensity.  I discussed his options being compression stockings, compression stockings with consideration of intervention which would either be stab phlebectomy or sclerotherapy versus no treatment.  We will fit him for thigh-high compression stockings today we will follow-up in 3 months for consideration of one of the above interventions versus continuation of compression stockings with no further intervention.  All of his questions were answered to his satisfaction.     Waynetta Sandy MD [facility]

## 2021-03-24 ENCOUNTER — Ambulatory Visit: Payer: Medicare HMO | Admitting: Gastroenterology

## 2021-03-24 ENCOUNTER — Encounter: Payer: Self-pay | Admitting: Gastroenterology

## 2021-03-24 VITALS — BP 122/74 | HR 74 | Temp 97.7°F | Ht 72.0 in | Wt 194.8 lb

## 2021-03-24 DIAGNOSIS — K766 Portal hypertension: Secondary | ICD-10-CM | POA: Diagnosis not present

## 2021-03-24 DIAGNOSIS — D500 Iron deficiency anemia secondary to blood loss (chronic): Secondary | ICD-10-CM | POA: Diagnosis not present

## 2021-03-24 DIAGNOSIS — K5900 Constipation, unspecified: Secondary | ICD-10-CM

## 2021-03-24 NOTE — Patient Instructions (Signed)
We are arranging an ultrasound in the near future.  I will see you in 6 months!  Continue close follow-up with Hematology.  It's always good to catch up with you. Let me know if you need anything!  Annitta Needs, PhD, ANP-BC [facility]

## 2021-03-24 NOTE — Progress Notes (Signed)
Referring Provider: Redmond School, MD Primary Care Physician:  Redmond School, MD Primary GI: Dr. Gala Romney   Chief Complaint  Patient presents with   Follow-up    Wants to discuss capsule study more   Constipation    Taking his amitiza daily    HPI:   Jerome [name]. is a 60 y.o. male presenting today with a history of chronic opioid-induced constipation, severe spinal stenosis, diagnosed with IDA last year with ferritin 11 in June 2021, fatty liver with splenomegaly and changes of portal hypertension on EGD (Grade 1-2 varices, portal gastropathy), following with serial ultrasounds. Not a candidate for non-selective beta-blocker due to orthostatic hypotension. Capsule study done in interim from last visit with erosions, no mass. EGD surveillance due in 2023.   Continues to follow with Hematology. Recent Hgb 16.9 in June 2022. Ferritin 32. Received 2 infusions in July. Labs due in September. No oral iron. Taking Amitiza in the morning. Has had to train his bowels and has best results when going int he morning. Worsening chronic pain. Struggling with depression currently. Has good support resources. No overt GI bleeding. Eager to stay on top of proactive care for liver.   Past Medical History:  Diagnosis Date   Anxiety    DJD (degenerative joint disease)    H/O cardiovascular stress test 2013   H/O echocardiogram 2012   for cad, htn & obesity   History of cardiac catheterization    a. normal cors by cath in 2011   Hypertension    Obesity    Spinal stenosis    Thrombocytopenia [facility])     Past Surgical History:  Procedure Laterality Date   BACK SURGERY     x 4   BIOPSY  05/27/2020   Procedure: BIOPSY;  Surgeon: Daneil Dolin, MD;  [facility];  Service: Endoscopy;;   BREAST REDUCTION SURGERY     CARDIAC CATHETERIZATION  2011   5 frech sheath   COLONOSCOPY N/A 10/14/2012   [id number] rectum and colon   COLONOSCOPY WITH PROPOFOL N/A 05/27/2020   normal    CYST EXCISION Right 08/08/2016   Procedure: EXCISION OF RIGHT INGUINAL SUBCUTANEOUS MASS (LIKELY SEBACEIYS CYST);  Surgeon: Vickie Epley, MD;  Location: [facility] OR[facility];  Service: General;  Laterality: Right;  7cm x 4cm mass; dermabond dressing   ESOPHAGOGASTRODUODENOSCOPY (EGD) WITH PROPOFOL N/A 05/27/2020   Grade 1-2 esophageal varices, possible Barrett's s/p biopsy (negative Barretts), portal gastropathy. Gastropathy could be source of occult GI bleeding. Negative H.pylori.   FLEXIBLE SIGMOIDOSCOPY N/A 10/15/2013   markedly inflamed distal rectum with ulceration, rectal polyp benign. Acute ulceration of rectum.    GIVENS CAPSULE STUDY N/A 01/12/2021   Procedure: GIVENS CAPSULE STUDY;  Surgeon: Daneil Dolin, MD;  [facility];  Service: Endoscopy;  Laterality: N/A;  8:30am   h/o cardiac cath     LAPAROSCOPIC GASTRIC SLEEVE RESECTION  06/2012   Dr Mickie Bail [facility])    Current Outpatient Medications  Medication Sig Dispense Refill   acetaminophen (TYLENOL) 500 MG tablet Take 1,000 mg by mouth 2 (two) times daily as needed for moderate pain or headache.      ascorbic acid (VITAMIN C) 500 MG tablet Take 500 mg by mouth daily.     B COMPLEX VITAMINS SL Place 1 Dose under the tongue daily. Liquid     bisacodyl (DULCOLAX) 5 MG EC tablet Take 10 mg by mouth at bedtime.  Calcium Carb-Cholecalciferol (CALCIUM/VITAMIN D PO) Take 1 tablet by mouth daily.     cetirizine (ZYRTEC) 10 MG tablet Take 10 mg by mouth as needed for allergies.     clonazePAM (KLONOPIN) 1 MG tablet Take 1 mg by mouth 2 (two) times daily.      cyclobenzaprine (FLEXERIL) 5 MG tablet Take 1 tablet (5 mg total) by mouth 3 (three) times daily as needed for muscle spasms. Only as needed. (Patient taking differently: Take 5 mg by mouth at bedtime. Marland Kitchen) 30 tablet 1   docusate sodium (COLACE) 100 MG capsule Take 300 mg by mouth at bedtime.     ferrous sulfate 325 (65 FE) MG tablet Take 325 mg by mouth daily with breakfast.      Flaxseed, Linseed, (FLAX SEED OIL) 1000 MG CAPS Take 1,000 mg by mouth daily.      fluticasone (FLONASE) 50 MCG/ACT nasal spray Place 2 sprays into both nostrils daily as needed for congestion.  5   HYDROcodone-acetaminophen (NORCO/VICODIN) 5-325 MG tablet Take 1 tablet by mouth every 6 (six) hours as needed for moderate pain.     losartan (COZAAR) 25 MG tablet Take 0.5 tablets (12.5 mg total) by mouth daily. 45 tablet 3   lubiprostone (AMITIZA) 24 MCG capsule TAKE 1 CAPSULE BY MOUTH TWICE DAILY WITH FOOD (Patient taking differently: Take 24 mcg by mouth daily with breakfast.) 60 capsule 5   methylPREDNISolone (MEDROL DOSEPAK) 4 MG TBPK tablet Take as directed 21 tablet 0   Multiple Vitamin (MULTIVITAMIN WITH MINERALS) TABS tablet Take 1 tablet by mouth daily.     omeprazole (PRILOSEC) 20 MG capsule Take 20 mg by mouth at bedtime.     pravastatin (PRAVACHOL) 40 MG tablet Take 40 mg by mouth daily.     sertraline (ZOLOFT) 100 MG tablet Take 100 mg by mouth daily.     zinc gluconate 50 MG tablet Take 25 mg by mouth daily.      No current facility-administered medications for this visit.    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[place])  Tobacco Use   Smoking status: Every Day    Packs/day: 0.25    Years: 36.00    Pack years: 9.00    Types: Cigarettes   Smokeless tobacco: Never  Vaping Use   Vaping Use: Never used  Substance and  Sexual Activity   Alcohol use: Yes    Alcohol/week: 0.0 standard drinks    Comment: occasionally    Drug use: No   Sexual activity: Yes    Partners: Male  Other Topics Concern   Not on file  Social History Narrative   Moving to [place] to care for brother w/ brain tumor   Lives w/ 2 daughters & 4 grandsons   Social Determinants of Health   Financial Resource Strain: Not on file  Food Insecurity: Not on file  Transportation Needs: Not on file  Physical Activity: Not on file  Stress: Not on file  Social Connections: Not on file    Review of Systems: Gen: Denies fever, chills, anorexia. Denies fatigue, weakness, weight loss.  CV: Denies chest pain, palpitations, syncope, peripheral edema, and claudication. Resp: Denies dyspnea at rest, cough, wheezing, coughing up blood, and pleurisy. GI: see HPI Derm: Denies rash, itching, dry skin Psych: Denies depression, anxiety, memory loss, confusion. No homicidal or suicidal ideation.  Heme: Denies bruising, bleeding, and enlarged lymph nodes.  Physical Exam: BP 122/74   Pulse 74   Temp 97.7 F (36.5 C)   Ht 6' (1.829 m)   Wt 194 lb 12.8 oz (88.4 kg)   BMI 26.42 kg/m  General:   Alert and oriented. No distress noted. Pleasant and cooperative.  Head:  Normocephalic and atraumatic. Eyes:  Conjuctiva clear without scleral icterus. Mouth:  mask in place Abdomen:  +BS, soft, non-tender and non-distended. No rebound or guarding. No HSM or masses noted. Msk:  Symmetrical without gross deformities. Normal posture. Extremities:  Without edema. Neurologic:  Alert and  oriented x4 Psych:  Alert and cooperative. Normal mood and affect.  ASSESSMENT: Jerome [name]. is a 60 y.o. male presenting today with a history of chronic opioid-induced constipation, severe spinal stenosis, diagnosed with IDA last year with ferritin 11 in June 2021, fatty liver with splenomegaly and changes of portal hypertension on EGD (Grade 1-2 varices, portal  gastropathy), following with serial ultrasounds. Not a candidate for non-selective beta-blocker due to orthostatic hypotension. Capsule study done in interim from last visit with erosions, no mass. EGD surveillance due in 2023.   Overall, he is well-compensated. Continues to follow with Hematology closely. No overt GI bleeding. He has a good handle on bowel regimen and dedicated time for toileting to avoid incontinent episodes. Could consider biofeedback training in the near future.   Will continue with serial [name] and next EGD in 2023.   PLAN:  Continue Amitiza Continue omeprazole daily RUQ [name] EGD in 2023 Return in 6 months   Annitta Needs, PhD, [facility]

## 2021-03-31 ENCOUNTER — Ambulatory Visit (HOSPITAL_COMMUNITY)
Admission: RE | Admit: 2021-03-31 | Discharge: 2021-03-31 | Disposition: A | Discharge status: Home / Self Care | Payer: Medicare HMO | Source: Ambulatory Visit | Attending: Gastroenterology | Admitting: Gastroenterology

## 2021-03-31 ENCOUNTER — Other Ambulatory Visit: Payer: Self-pay

## 2021-03-31 DIAGNOSIS — K8020 Calculus of gallbladder without cholecystitis without obstruction: Secondary | ICD-10-CM | POA: Diagnosis not present

## 2021-03-31 DIAGNOSIS — K766 Portal hypertension: Secondary | ICD-10-CM | POA: Diagnosis not present

## 2021-03-31 DIAGNOSIS — K760 Fatty (change of) liver, not elsewhere classified: Secondary | ICD-10-CM | POA: Diagnosis not present

## 2021-04-04 DIAGNOSIS — M5416 Radiculopathy, lumbar region: Secondary | ICD-10-CM | POA: Diagnosis not present

## 2021-04-04 DIAGNOSIS — M549 Dorsalgia, unspecified: Secondary | ICD-10-CM | POA: Diagnosis not present

## 2021-04-04 DIAGNOSIS — M47812 Spondylosis without myelopathy or radiculopathy, cervical region: Secondary | ICD-10-CM | POA: Diagnosis not present

## 2021-04-04 DIAGNOSIS — M5412 Radiculopathy, cervical region: Secondary | ICD-10-CM | POA: Diagnosis not present

## 2021-04-04 DIAGNOSIS — Z5181 Encounter for therapeutic drug level monitoring: Secondary | ICD-10-CM | POA: Diagnosis not present

## 2021-04-05 DIAGNOSIS — Z9884 Bariatric surgery status: Secondary | ICD-10-CM | POA: Diagnosis not present

## 2021-04-08 DIAGNOSIS — G894 Chronic pain syndrome: Secondary | ICD-10-CM | POA: Diagnosis not present

## 2021-04-08 DIAGNOSIS — M47812 Spondylosis without myelopathy or radiculopathy, cervical region: Secondary | ICD-10-CM | POA: Diagnosis not present

## 2021-04-26 ENCOUNTER — Other Ambulatory Visit: Payer: Self-pay | Admitting: *Deleted

## 2021-04-26 DIAGNOSIS — F17210 Nicotine dependence, cigarettes, uncomplicated: Secondary | ICD-10-CM

## 2021-05-11 DIAGNOSIS — F331 Major depressive disorder, recurrent, moderate: Secondary | ICD-10-CM | POA: Diagnosis not present

## 2021-05-11 DIAGNOSIS — R69 Illness, unspecified: Secondary | ICD-10-CM | POA: Diagnosis not present

## 2021-05-11 DIAGNOSIS — F418 Other specified anxiety disorders: Secondary | ICD-10-CM | POA: Diagnosis not present

## 2021-05-25 NOTE — Progress Notes (Signed)
Cardiology Office Note  Date: 05/26/2021   ID: Jerome Rhine., DOB 1961-02-05, MRN [id number]  PCP:  Redmond School, MD  Cardiologist:  None Electrophysiologist:  None   Chief Complaint:  6 month follow-up  History of Present Illness: Jerome Bogacki. is a 60 y.o. male with a history of palpitations, near syncope, varicose veins of leg with complications, essential hypertension, thrombocytopenia, OSA on CPAP, esophageal varices and suspected Barrett's esophagus on recent endoscopy, iron deficiency anemia.  He is here for 45-month follow-up today.  He denies any recent heart related issues other than one episode of palpitations which lasted a little longer than usual.  He states he occasionally has palpitations about once or twice a week.  He stated he could feel the palpitations in his neck and hear them in his ear during this episode which was unusual for him per his statement.  He denied any associated shortness of breath, dizziness, lightheadedness, presyncope or syncopal episode.  Blood pressure is well controlled today at 116/80.  Heart rate is 88.  He states he is lost some weight recently not eating as well as he should.  His weight on 03/24/2021 was 194.  Today's weight on our scales is 185.  Continues to have problems with his back and foot drop on his left foot also history of portal hypertension, esophageal varices and iron deficiency anemia.  He had recent lab work at [facility] for his anemia.  He states he does drink a fair amount of caffeine daily.  We discussed caffeine could possibly be contributing to palpitations.  He continues to smoke but states he only smokes about 4 cigarettes/day.   Past Medical History:  Diagnosis Date   Anxiety    DJD (degenerative joint disease)    H/O cardiovascular stress test 2013   H/O echocardiogram 2012   for cad, htn & obesity   History of cardiac catheterization    a. normal cors by cath in 2011   Hypertension    Obesity    Spinal  stenosis    Thrombocytopenia [facility])     Past Surgical History:  Procedure Laterality Date   BACK SURGERY     x 4   BIOPSY  05/27/2020   Procedure: BIOPSY;  Surgeon: Daneil Dolin, MD;  [facility];  Service: Endoscopy;;   BREAST REDUCTION SURGERY     CARDIAC CATHETERIZATION  2011   5 frech sheath   COLONOSCOPY N/A 10/14/2012   [id number] rectum and colon   COLONOSCOPY WITH PROPOFOL N/A 05/27/2020   normal   CYST EXCISION Right 08/08/2016   Procedure: EXCISION OF RIGHT INGUINAL SUBCUTANEOUS MASS (LIKELY SEBACEIYS CYST);  Surgeon: Vickie Epley, MD;  Location: [facility];  Service: General;  Laterality: Right;  7cm x 4cm mass; dermabond dressing   ESOPHAGOGASTRODUODENOSCOPY (EGD) WITH PROPOFOL N/A 05/27/2020   Grade 1-2 esophageal varices, possible Barrett's s/p biopsy (negative Barretts), portal gastropathy. Gastropathy could be source of occult GI bleeding. Negative H.pylori.   FLEXIBLE SIGMOIDOSCOPY N/A 10/15/2013   markedly inflamed distal rectum with ulceration, rectal polyp benign. Acute ulceration of rectum.    GIVENS CAPSULE STUDY N/A 01/12/2021   Procedure: GIVENS CAPSULE STUDY;  Surgeon: Daneil Dolin, MD;  [facility];  Service: Endoscopy;  Laterality: N/A;  8:30am   h/o cardiac cath     LAPAROSCOPIC GASTRIC SLEEVE RESECTION  06/2012   Dr Mickie Bail [facility])    Current Outpatient Medications  Medication Sig Dispense Refill  acetaminophen (TYLENOL) 500 MG tablet Take 1,000 mg by mouth 2 (two) times daily as needed for moderate pain or headache.      ascorbic acid (VITAMIN C) 500 MG tablet Take 500 mg by mouth daily.     B COMPLEX VITAMINS SL Place 1 Dose under the tongue daily. Liquid     bisacodyl (DULCOLAX) 5 MG EC tablet Take 10 mg by mouth at bedtime.     Calcium Carb-Cholecalciferol (CALCIUM/VITAMIN D PO) Take 1 tablet by mouth daily.     cetirizine (ZYRTEC) 10 MG tablet Take 10 mg by mouth as needed for allergies.     clonazePAM (KLONOPIN) 1 MG  tablet Take 1 mg by mouth 2 (two) times daily.      cyclobenzaprine (FLEXERIL) 5 MG tablet Take 1 tablet (5 mg total) by mouth 3 (three) times daily as needed for muscle spasms. Only as needed. (Patient taking differently: Take 5 mg by mouth at bedtime. Marland Kitchen) 30 tablet 1   docusate sodium (COLACE) 100 MG capsule Take 300 mg by mouth at bedtime.     ferrous sulfate 325 (65 FE) MG tablet Take 325 mg by mouth daily with breakfast.     Flaxseed, Linseed, (FLAX SEED OIL) 1000 MG CAPS Take 1,000 mg by mouth daily.      fluticasone (FLONASE) 50 MCG/ACT nasal spray Place 2 sprays into both nostrils daily as needed for congestion.  5   HYDROcodone-acetaminophen (NORCO/VICODIN) 5-325 MG tablet Take 1 tablet by mouth every 6 (six) hours as needed for moderate pain.     losartan (COZAAR) 25 MG tablet Take 0.5 tablets (12.5 mg total) by mouth daily. 45 tablet 3   lubiprostone (AMITIZA) 24 MCG capsule TAKE 1 CAPSULE BY MOUTH TWICE DAILY WITH FOOD (Patient taking differently: Take 24 mcg by mouth daily with breakfast.) 60 capsule 5   methylPREDNISolone (MEDROL DOSEPAK) 4 MG TBPK tablet Take as directed 21 tablet 0   Multiple Vitamin (MULTIVITAMIN WITH MINERALS) TABS tablet Take 1 tablet by mouth daily.     omeprazole (PRILOSEC) 20 MG capsule Take 20 mg by mouth at bedtime.     pravastatin (PRAVACHOL) 40 MG tablet Take 40 mg by mouth daily.     sertraline (ZOLOFT) 100 MG tablet Take 100 mg by mouth daily.     zinc gluconate 50 MG tablet Take 25 mg by mouth daily.      No current facility-administered medications for this visit.   Allergies:  Aspirin, Codeine, and Nsaids   Social History: The patient  reports that he has been smoking cigarettes. He has a 9.00 pack-year smoking history. He has never used smokeless tobacco. He reports current alcohol use. He reports that he does not use drugs.   Family History: The patient's family history includes Brain cancer (age of onset: 59) in his brother; Colon cancer in his  paternal grandmother; Heart attack in his father.   ROS:  Please see the history of present illness. Otherwise, complete review of systems is positive for none.  All other systems are reviewed and negative.   Physical Exam: VS:  BP 116/80   Pulse 88   Ht 5\' 11"  (1.803 m)   Wt 185 lb (83.9 kg)   SpO2 97%   BMI 25.80 kg/m , BMI Body mass index is 25.8 kg/m.  Wt Readings from Last 3 Encounters:  05/26/21 185 lb (83.9 kg)  03/24/21 194 lb 12.8 oz (88.4 kg)  03/23/21 195 lb (88.5 kg)    General: Patient  appears comfortable at rest. Neck: Supple, no elevated JVP or carotid bruits, no thyromegaly. Lungs: Clear to auscultation, nonlabored breathing at rest. Cardiac: Regular rate and rhythm, no S3 or significant systolic murmur, no pericardial rub. Extremities: No pitting edema, distal pulses 2+. Skin: Warm and dry. Musculoskeletal: No kyphosis. Neuropsychiatric: Alert and oriented x3, affect grossly appropriate.  ECG:  An ECG dated 11/26/2020 was personally reviewed today and demonstrated:  Normal sinus rhythm rate of 76.  Recent Labwork: 11/16/2020: ALT 13; AST 15; BUN 11; Creat 0.84; Hemoglobin 16.5; Platelets 123; Potassium 3.9; Sodium 138 11/29/2020: TSH 1.20  No results found for: CHOL, TRIG, HDL, CHOLHDL, VLDL, LDLCALC, LDLDIRECT  Other Studies Reviewed Today:   Lower extremity venous 8 09/21/2020 Summary:  Left:  - No evidence of deep vein thrombosis from the common femoral through the  popliteal veins.  - No evidence of superficial venous thrombosis.  - The deep venous system is competent.  - The great saphenous vein was successfully ablated.  - The small saphenous vein is competent.     *See table(s) above for measurements and observations.     Echocardiogram 12/01/2020   1. Left ventricular ejection fraction, by estimation, is 55 to 60%. The  left ventricle has normal function. Left ventricular endocardial border  not optimally defined to evaluate regional wall  motion. There is mild left  ventricular hypertrophy. Left  ventricular diastolic parameters are indeterminate.   2. Right ventricular systolic function is normal. The right ventricular  size is normal.   3. The mitral valve is normal in structure. No evidence of mitral valve  regurgitation. No evidence of mitral stenosis.   4. The aortic valve was not well visualized. Aortic valve regurgitation  is not visualized. No aortic stenosis is present.   5. The inferior vena cava is normal in size with greater than 50%  respiratory variability, suggesting right atrial pressure of 3 mmHg.       Echocardiogram 01/27/2019  1. The left ventricle has normal systolic function with an ejection  fraction of 60-65%. The cavity size was normal. There is mildly increased  left ventricular wall thickness. Left ventricular diastolic parameters  were normal.   2. The right ventricle has normal systolic function. The cavity was  normal. There is no increase in right ventricular wall thickness.   3. No evidence of mitral valve stenosis.   4. The aortic valve has an indeterminate number of cusps. No stenosis of  the aortic valve.   5. The aortic root is normal in size and structure.   Assessment and Plan:  1. Palpitations   2. Essential hypertension   3. Sleep-disordered breathing   4. Fatigue, unspecified type   5. Iron deficiency anemia, unspecified iron deficiency anemia type   6. SOB (shortness of breath)   7. Mixed hyperlipidemia     1. Palpitations He states he recently had an episode of palpitations which caused a thumping sensation in his ear which is unusual for him.  He states for the most part he has palpitations maybe once or twice per week.  He states he does drink a fair amount of coffee during the day.  We discussed caffeine contribution to possible palpitations.  He also continues to smoke around 4 cigarettes/day per his statement.  We discussed possible cardiac monitor in the future if  palpitations become more bothersome.  2. Essential hypertension Blood pressure well controlled today at 116/80.  Continue losartan 12.5 mg p.o. daily.  3. Sleep-disordered breathing He  is compliant with his CPAP.  4.  Fatigue/iron deficiency anemia/thrombocytopenia At last visit was complaining of fatigue.  He does have iron deficiency anemia, depression and decreased appetite.  Had a recent panel of lab work at [facility] for his anemia..  His vitamin B12 was 749, CBC showed hemoglobin 17.4, hematocrit 49.5.  Platelet count 122.  Ferritin was 115.  Iron 91, TIBC 285.5, transferrin 203.9, percent saturation 32.  He has fluctuating issues with anemia per his statement.  He is continuing with iron supplementation.    5.  Shortness of breath, increased activity intolerance. No significant issues with shortness of breath currently.  He is not very active due to degenerative disease, left foot drop, and issues with his back.  He walks with a cane.  His echocardiogram in May demonstrated EF of 55 to 60%.  Mild LVH, indeterminate diastolic parameters, no valvular abnormalities.  6.  Hyperlipidemia Continue pravastatin 40 mg p.o. daily.  Medication Adjustments/Labs and Tests Ordered: Current medicines are reviewed at length with the patient today.  Concerns regarding medicines are outlined above.   Disposition: Follow-up with Dr. Harl Bowie or APP 6 months  Signed, Jerome July, NP 05/26/2021 11:45 AM    [facility] [place], [place],  [address] Phone: [phone]; Fax: [phone]

## 2021-05-26 ENCOUNTER — Ambulatory Visit: Payer: Medicare HMO | Admitting: Family Medicine

## 2021-05-26 ENCOUNTER — Encounter: Payer: Self-pay | Admitting: Family Medicine

## 2021-05-26 VITALS — BP 116/80 | HR 88 | Ht 71.0 in | Wt 185.0 lb

## 2021-05-26 DIAGNOSIS — I10 Essential (primary) hypertension: Secondary | ICD-10-CM | POA: Diagnosis not present

## 2021-05-26 DIAGNOSIS — R5383 Other fatigue: Secondary | ICD-10-CM | POA: Diagnosis not present

## 2021-05-26 DIAGNOSIS — G4730 Sleep apnea, unspecified: Secondary | ICD-10-CM | POA: Diagnosis not present

## 2021-05-26 DIAGNOSIS — R002 Palpitations: Secondary | ICD-10-CM | POA: Diagnosis not present

## 2021-05-26 DIAGNOSIS — R0602 Shortness of breath: Secondary | ICD-10-CM

## 2021-05-26 DIAGNOSIS — D509 Iron deficiency anemia, unspecified: Secondary | ICD-10-CM

## 2021-05-26 DIAGNOSIS — E782 Mixed hyperlipidemia: Secondary | ICD-10-CM

## 2021-05-26 NOTE — Patient Instructions (Addendum)

## 2021-05-30 ENCOUNTER — Ambulatory Visit (HOSPITAL_COMMUNITY)
Admission: RE | Admit: 2021-05-30 | Discharge: 2021-05-30 | Disposition: A | Discharge status: Home / Self Care | Payer: Medicare HMO | Source: Ambulatory Visit | Attending: Acute Care | Admitting: Acute Care

## 2021-05-30 ENCOUNTER — Other Ambulatory Visit: Payer: Self-pay

## 2021-05-30 DIAGNOSIS — Z122 Encounter for screening for malignant neoplasm of respiratory organs: Secondary | ICD-10-CM | POA: Insufficient documentation

## 2021-05-30 DIAGNOSIS — J439 Emphysema, unspecified: Secondary | ICD-10-CM | POA: Insufficient documentation

## 2021-05-30 DIAGNOSIS — R69 Illness, unspecified: Secondary | ICD-10-CM | POA: Diagnosis not present

## 2021-05-30 DIAGNOSIS — K8020 Calculus of gallbladder without cholecystitis without obstruction: Secondary | ICD-10-CM | POA: Insufficient documentation

## 2021-05-30 DIAGNOSIS — F17210 Nicotine dependence, cigarettes, uncomplicated: Secondary | ICD-10-CM | POA: Diagnosis not present

## 2021-06-01 ENCOUNTER — Other Ambulatory Visit: Payer: Self-pay | Admitting: Acute Care

## 2021-06-01 DIAGNOSIS — F17200 Nicotine dependence, unspecified, uncomplicated: Secondary | ICD-10-CM

## 2021-06-13 MED ORDER — LUBIPROSTONE 24 MCG PO CAPS: ORAL_CAPSULE | ORAL | 5 refills | Status: DC

## 2021-06-21 DIAGNOSIS — F331 Major depressive disorder, recurrent, moderate: Secondary | ICD-10-CM | POA: Diagnosis not present

## 2021-06-21 DIAGNOSIS — F418 Other specified anxiety disorders: Secondary | ICD-10-CM | POA: Diagnosis not present

## 2021-06-21 DIAGNOSIS — R69 Illness, unspecified: Secondary | ICD-10-CM | POA: Diagnosis not present

## 2021-06-23 DIAGNOSIS — M5416 Radiculopathy, lumbar region: Secondary | ICD-10-CM | POA: Diagnosis not present

## 2021-06-23 DIAGNOSIS — G894 Chronic pain syndrome: Secondary | ICD-10-CM | POA: Diagnosis not present

## 2021-06-23 DIAGNOSIS — Z5181 Encounter for therapeutic drug level monitoring: Secondary | ICD-10-CM | POA: Diagnosis not present

## 2021-06-23 DIAGNOSIS — M5417 Radiculopathy, lumbosacral region: Secondary | ICD-10-CM | POA: Diagnosis not present

## 2021-06-23 DIAGNOSIS — M47812 Spondylosis without myelopathy or radiculopathy, cervical region: Secondary | ICD-10-CM | POA: Diagnosis not present

## 2021-06-23 DIAGNOSIS — M5412 Radiculopathy, cervical region: Secondary | ICD-10-CM | POA: Diagnosis not present

## 2021-06-23 DIAGNOSIS — M542 Cervicalgia: Secondary | ICD-10-CM | POA: Diagnosis not present

## 2021-06-23 DIAGNOSIS — M549 Dorsalgia, unspecified: Secondary | ICD-10-CM | POA: Diagnosis not present

## 2021-06-23 DIAGNOSIS — D696 Thrombocytopenia, unspecified: Secondary | ICD-10-CM | POA: Diagnosis not present

## 2021-06-23 DIAGNOSIS — M533 Sacrococcygeal disorders, not elsewhere classified: Secondary | ICD-10-CM | POA: Diagnosis not present

## 2021-06-29 ENCOUNTER — Encounter: Payer: Self-pay | Admitting: Vascular Surgery

## 2021-06-29 ENCOUNTER — Ambulatory Visit: Payer: Medicare HMO | Admitting: Vascular Surgery

## 2021-06-29 ENCOUNTER — Other Ambulatory Visit: Payer: Self-pay

## 2021-06-29 VITALS — BP 125/82 | HR 81 | Temp 98.2°F | Resp 20 | Ht 71.0 in | Wt 185.0 lb

## 2021-06-29 DIAGNOSIS — I8392 Asymptomatic varicose veins of left lower extremity: Secondary | ICD-10-CM | POA: Diagnosis not present

## 2021-06-29 DIAGNOSIS — G4733 Obstructive sleep apnea (adult) (pediatric): Secondary | ICD-10-CM | POA: Diagnosis not present

## 2021-06-29 NOTE — Progress Notes (Signed)
Patient ID: Jerome [name]., male   DOB: August 09, 1960, 60 y.o.   MRN: [id number]  Reason for Consult: Follow-up   Referred by Redmond School, MD  Subjective:     HPI:  Jerome [name]. is a 60 y.o. 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  He has previously undergone left greater saphenous vein ablation in 2016 and also greater than 20 stab phlebectomies at that time.  He has followed up for consideration of further stab phlebectomy.  States that the leg has several issues most of which are neurogenic from previous back surgery with numbness throughout the lower extremity.  He has been compliant with compression therapy that is thigh-high and prescribed at our last office visit he is requesting more today.  He does not have any history of DVT.  He has not had any bleeding or thrombosis of his varicosities.  Today he has very few complaints regarding the varicose veins.  Due to previous lumbar related issues he does walk with the help of a cane.  Past Medical History:  Diagnosis Date   Anxiety    DJD (degenerative joint disease)    H/O cardiovascular stress test 2013   H/O echocardiogram 2012   for cad, htn & obesity   History of cardiac catheterization    a. normal cors by cath in 2011   Hypertension    Obesity    Spinal stenosis    Thrombocytopenia ([place])    Family History  Problem Relation Age of Onset   Heart attack Father    Colon cancer Paternal Grandmother    Brain cancer Brother 32   Past Surgical History:  Procedure Laterality Date   BACK SURGERY     x 4   BIOPSY  05/27/2020   Procedure: BIOPSY;  Surgeon: Daneil Dolin, MD;  [facility];  Service: Endoscopy;;   BREAST REDUCTION SURGERY     CARDIAC CATHETERIZATION  2011   5 frech sheath   COLONOSCOPY N/A 10/14/2012   [id number] rectum and colon   COLONOSCOPY WITH PROPOFOL N/A 05/27/2020   normal   CYST EXCISION Right 08/08/2016   Procedure: EXCISION OF RIGHT  INGUINAL SUBCUTANEOUS MASS (LIKELY SEBACEIYS CYST);  Surgeon: Vickie Epley, MD;  Location: [facility];  Service: General;  Laterality: Right;  7cm x 4cm mass; dermabond dressing   ESOPHAGOGASTRODUODENOSCOPY (EGD) WITH PROPOFOL N/A 05/27/2020   Grade 1-2 esophageal varices, possible Barrett's s/p biopsy (negative Barretts), portal gastropathy. Gastropathy could be source of occult GI bleeding. Negative H.pylori.   FLEXIBLE SIGMOIDOSCOPY N/A 10/15/2013   markedly inflamed distal rectum with ulceration, rectal polyp benign. Acute ulceration of rectum.    GIVENS CAPSULE STUDY N/A 01/12/2021   Procedure: GIVENS CAPSULE STUDY;  Surgeon: Daneil Dolin, MD;  [facility];  Service: Endoscopy;  Laterality: N/A;  8:30am   h/o cardiac cath     LAPAROSCOPIC GASTRIC SLEEVE RESECTION  06/2012   Dr Mickie Bail [facility])    Short Social History:  Social History   Tobacco Use   Smoking status: Every Day    Packs/day: 0.25    Years: 36.00    Pack years: 9.00    Types: Cigarettes   Smokeless tobacco: Never  Substance Use Topics   Alcohol use: Yes    Alcohol/week: 0.0 standard drinks    Comment: occasionally     Allergies  Allergen Reactions   Aspirin Nausea And Vomiting    Due to gastric sleeve  surgery    Codeine Nausea And Vomiting   Nsaids Anxiety    Due to gastric sleeve surgery  N/V    Current Outpatient Medications  Medication Sig Dispense Refill   acetaminophen (TYLENOL) 500 MG tablet Take 1,000 mg by mouth 2 (two) times daily as needed for moderate pain or headache.      ascorbic acid (VITAMIN C) 500 MG tablet Take 500 mg by mouth daily.     B COMPLEX VITAMINS SL Place 1 Dose under the tongue daily. Liquid     bisacodyl (DULCOLAX) 5 MG EC tablet Take 10 mg by mouth at bedtime.     Calcium Carb-Cholecalciferol (CALCIUM/VITAMIN D PO) Take 1 tablet by mouth daily.     cetirizine (ZYRTEC) 10 MG tablet Take 10 mg by mouth as needed for allergies.     clonazePAM (KLONOPIN) 1 MG tablet  Take 1 mg by mouth 2 (two) times daily.      cyclobenzaprine (FLEXERIL) 5 MG tablet Take 1 tablet (5 mg total) by mouth 3 (three) times daily as needed for muscle spasms. Only as needed. (Patient taking differently: Take 5 mg by mouth at bedtime. Marland Kitchen) 30 tablet 1   docusate sodium (COLACE) 100 MG capsule Take 300 mg by mouth at bedtime.     ferrous sulfate 325 (65 FE) MG tablet Take 325 mg by mouth daily with breakfast.     Flaxseed, Linseed, (FLAX SEED OIL) 1000 MG CAPS Take 1,000 mg by mouth daily.      fluticasone (FLONASE) 50 MCG/ACT nasal spray Place 2 sprays into both nostrils daily as needed for congestion.  5   HYDROcodone-acetaminophen (NORCO/VICODIN) 5-325 MG tablet Take 1 tablet by mouth every 6 (six) hours as needed for moderate pain.     losartan (COZAAR) 25 MG tablet Take 0.5 tablets (12.5 mg total) by mouth daily. 45 tablet 3   lubiprostone (AMITIZA) 24 MCG capsule TAKE 1 CAPSULE BY MOUTH TWICE DAILY WITH FOOD 60 capsule 5   methylPREDNISolone (MEDROL DOSEPAK) 4 MG TBPK tablet Take as directed 21 tablet 0   Multiple Vitamin (MULTIVITAMIN WITH MINERALS) TABS tablet Take 1 tablet by mouth daily.     omeprazole (PRILOSEC) 20 MG capsule Take 20 mg by mouth at bedtime.     pravastatin (PRAVACHOL) 40 MG tablet Take 40 mg by mouth daily.     sertraline (ZOLOFT) 100 MG tablet Take 100 mg by mouth daily.     zinc gluconate 50 MG tablet Take 25 mg by mouth daily.      No current facility-administered medications for this visit.    Review of Systems  Constitutional:  Constitutional negative. HENT: HENT negative.  Eyes: Eyes negative.  Cardiovascular: Positive for leg swelling.  Musculoskeletal: Positive for back pain, gait problem and leg pain.  Skin: Skin negative.  Hematologic: Hematologic/lymphatic negative.  Psychiatric: Psychiatric negative.       Objective:  Objective   Vitals:   06/29/21 1426  BP: 125/82  Pulse: 81  Resp: 20  Temp: 98.2 F (36.8 C)  SpO2: 97%   Weight: 185 lb (83.9 kg)  Height: 5\' 11"  (1.803 m)   Body mass index is 25.8 kg/m.  Physical Exam HENT:     Head: Normocephalic.     Nose:     Comments: Wearing a mask Eyes:     Pupils: Pupils are equal, round, and reactive to light.  Cardiovascular:     Rate and Rhythm: Normal rate.     Pulses: Normal pulses.  Pulmonary:     Effort: Pulmonary effort is normal.  Abdominal:     General: Abdomen is flat.     Palpations: Abdomen is soft.  Musculoskeletal:     Cervical back: Normal range of motion and neck supple.     Right lower leg: No edema.     Left lower leg: Edema present.     Comments: Multiple clustered varicose veins on the anterior and lateral area of the left leg with reticular veins on the medial leg  Skin:    General: Skin is warm.     Capillary Refill: Capillary refill takes less than 2 seconds.  Neurological:     General: No focal deficit present.     Mental Status: He is alert and oriented to person, place, and time.  Psychiatric:        Mood and Affect: Mood normal.    Data: No studies today     Assessment/Plan:     59 year old male with previous left lower extremity greater saphenous vein ablation and stab phlebectomies now here for evaluation of further stab phlebectomy.  At this time this does not appear to be the major cause of his symptoms mostly having numbness and discomfort with difficulty walking due to previous back surgery.  I discussed with him continued use of compression socks and he is purchasing another pair of thigh-high compression today.  He will follow-up on an as-needed basis.    Waynetta Sandy MD [facility]

## 2021-07-06 DIAGNOSIS — Z5181 Encounter for therapeutic drug level monitoring: Secondary | ICD-10-CM | POA: Diagnosis not present

## 2021-07-06 DIAGNOSIS — E611 Iron deficiency: Secondary | ICD-10-CM | POA: Diagnosis not present

## 2021-07-06 DIAGNOSIS — D696 Thrombocytopenia, unspecified: Secondary | ICD-10-CM | POA: Diagnosis not present

## 2021-07-06 DIAGNOSIS — K909 Intestinal malabsorption, unspecified: Secondary | ICD-10-CM | POA: Diagnosis not present

## 2021-07-06 DIAGNOSIS — Z9884 Bariatric surgery status: Secondary | ICD-10-CM | POA: Diagnosis not present

## 2021-07-07 DIAGNOSIS — Z0000 Encounter for general adult medical examination without abnormal findings: Secondary | ICD-10-CM | POA: Diagnosis not present

## 2021-07-07 DIAGNOSIS — M48062 Spinal stenosis, lumbar region with neurogenic claudication: Secondary | ICD-10-CM | POA: Diagnosis not present

## 2021-07-07 DIAGNOSIS — Z1331 Encounter for screening for depression: Secondary | ICD-10-CM | POA: Diagnosis not present

## 2021-07-07 DIAGNOSIS — K7581 Nonalcoholic steatohepatitis (NASH): Secondary | ICD-10-CM | POA: Diagnosis not present

## 2021-07-07 DIAGNOSIS — G894 Chronic pain syndrome: Secondary | ICD-10-CM | POA: Diagnosis not present

## 2021-07-07 DIAGNOSIS — K219 Gastro-esophageal reflux disease without esophagitis: Secondary | ICD-10-CM | POA: Diagnosis not present

## 2021-07-07 DIAGNOSIS — Z6824 Body mass index (BMI) 24.0-24.9, adult: Secondary | ICD-10-CM | POA: Diagnosis not present

## 2021-07-07 DIAGNOSIS — E663 Overweight: Secondary | ICD-10-CM | POA: Diagnosis not present

## 2021-07-07 DIAGNOSIS — I10 Essential (primary) hypertension: Secondary | ICD-10-CM | POA: Diagnosis not present

## 2021-07-13 DIAGNOSIS — H6123 Impacted cerumen, bilateral: Secondary | ICD-10-CM | POA: Diagnosis not present

## 2021-07-30 DIAGNOSIS — G4733 Obstructive sleep apnea (adult) (pediatric): Secondary | ICD-10-CM | POA: Diagnosis not present

## 2021-08-01 DIAGNOSIS — G894 Chronic pain syndrome: Secondary | ICD-10-CM | POA: Diagnosis not present

## 2021-08-01 DIAGNOSIS — K7581 Nonalcoholic steatohepatitis (NASH): Secondary | ICD-10-CM | POA: Diagnosis not present

## 2021-08-01 DIAGNOSIS — U071 COVID-19: Secondary | ICD-10-CM | POA: Diagnosis not present

## 2021-08-01 DIAGNOSIS — I10 Essential (primary) hypertension: Secondary | ICD-10-CM | POA: Diagnosis not present

## 2021-08-30 DIAGNOSIS — G4733 Obstructive sleep apnea (adult) (pediatric): Secondary | ICD-10-CM | POA: Diagnosis not present

## 2021-09-19 DIAGNOSIS — M79672 Pain in left foot: Secondary | ICD-10-CM | POA: Diagnosis not present

## 2021-09-19 DIAGNOSIS — M779 Enthesopathy, unspecified: Secondary | ICD-10-CM | POA: Diagnosis not present

## 2021-09-19 DIAGNOSIS — M79675 Pain in left toe(s): Secondary | ICD-10-CM | POA: Diagnosis not present

## 2021-09-19 DIAGNOSIS — M1990 Unspecified osteoarthritis, unspecified site: Secondary | ICD-10-CM | POA: Diagnosis not present

## 2021-09-19 DIAGNOSIS — S93332A Other subluxation of left foot, initial encounter: Secondary | ICD-10-CM | POA: Diagnosis not present

## 2021-09-19 DIAGNOSIS — G894 Chronic pain syndrome: Secondary | ICD-10-CM | POA: Diagnosis not present

## 2021-09-20 DIAGNOSIS — E782 Mixed hyperlipidemia: Secondary | ICD-10-CM | POA: Diagnosis not present

## 2021-09-20 DIAGNOSIS — I10 Essential (primary) hypertension: Secondary | ICD-10-CM | POA: Diagnosis not present

## 2021-09-22 ENCOUNTER — Other Ambulatory Visit: Payer: Self-pay

## 2021-09-22 ENCOUNTER — Ambulatory Visit: Payer: Medicare HMO | Admitting: Gastroenterology

## 2021-09-22 ENCOUNTER — Telehealth: Payer: Self-pay

## 2021-09-22 VITALS — BP 128/76 | HR 62 | Temp 97.3°F | Ht 72.0 in | Wt 181.4 lb

## 2021-09-22 DIAGNOSIS — K766 Portal hypertension: Secondary | ICD-10-CM | POA: Diagnosis not present

## 2021-09-22 DIAGNOSIS — K5900 Constipation, unspecified: Secondary | ICD-10-CM

## 2021-09-22 DIAGNOSIS — D509 Iron deficiency anemia, unspecified: Secondary | ICD-10-CM

## 2021-09-22 NOTE — Telephone Encounter (Signed)
[name] abd RUQ scheduled for 09/30/21 at 9:30am, arrive at 9:00am. NPO after midnight prior to test. ? ?Called pt, informed him of [name] appt. Appt letter mailed. ?

## 2021-09-22 NOTE — Patient Instructions (Signed)
We are ordering an ultrasound of your liver that we will do every 6 months. ? ?We will see you in 6 months! ? ?Your endoscopy will be due at the end of this year, which we can arrange at your next visit. ? ?Please let me know if you need anything! ? ?I enjoyed seeing you again today! As you know, I value our relationship and want to provide genuine, compassionate, and quality care. I welcome your feedback. If you receive a survey regarding your visit,  I greatly appreciate you taking time to fill this out. See you next time! ? ?Annitta Needs, PhD, ANP-BC ?[facility]  ? ?

## 2021-09-22 NOTE — Progress Notes (Signed)
Gastroenterology Office Note     Primary Care Physician:  Redmond School, MD  Primary Gastroenterologist: Dr. Gala Romney    Chief Complaint   Chief Complaint  Patient presents with   Follow-up     History of Present Illness   Jerome [name]. is a 61 y.o. male presenting today in follow-up with a history of chronic opioid-induced constipation, severe spinal stenosis, diagnosed with IDA 2021 with ferritin 11 in June 2021, fatty liver with splenomegaly and changes of portal hypertension on EGD (Grade 1-2 varices, portal gastropathy), following with serial ultrasounds. Not a candidate for non-selective beta-blocker due to orthostatic hypotension. Capsule study done with erosions, no mass. EGD surveillance due in 2023.    Most recent labs in Dec 2022 with ferritin 131. Hgb 17.4. He will be seeing Hematology soon.    Taking 4 stool softeners at night and 3 Bisocodyl. Amitiza 24 mcg only once daily as trying to ration out Amitiza due to cost. Still expensive despite insurance.   Not hungry. Has had issues with depression. No iron supplement now. Occasional issues with hemorrhoids. Has regimen for constipation that he wants to stick with for now.   Past Medical History:  Diagnosis Date   Anxiety    DJD (degenerative joint disease)    H/O cardiovascular stress test 2013   H/O echocardiogram 2012   for cad, htn & obesity   History of cardiac catheterization    a. normal cors by cath in 2011   Hypertension    Obesity    Spinal stenosis    Thrombocytopenia [facility])     Past Surgical History:  Procedure Laterality Date   BACK SURGERY     x 4   BIOPSY  05/27/2020   Procedure: BIOPSY;  Surgeon: Daneil Dolin, MD;  [facility];  Service: Endoscopy;;   BREAST REDUCTION SURGERY     CARDIAC CATHETERIZATION  2011   5 frech sheath   COLONOSCOPY N/A 10/14/2012   [id number] rectum and colon   COLONOSCOPY WITH PROPOFOL N/A 05/27/2020   normal   CYST EXCISION Right  08/08/2016   Procedure: EXCISION OF RIGHT INGUINAL SUBCUTANEOUS MASS (LIKELY SEBACEIYS CYST);  Surgeon: Vickie Epley, MD;  Location: [facility];  Service: General;  Laterality: Right;  7cm x 4cm mass; dermabond dressing   ESOPHAGOGASTRODUODENOSCOPY (EGD) WITH PROPOFOL N/A 05/27/2020   Grade 1-2 esophageal varices, possible Barrett's s/p biopsy (negative Barretts), portal gastropathy. Gastropathy could be source of occult GI bleeding. Negative H.pylori.   FLEXIBLE SIGMOIDOSCOPY N/A 10/15/2013   markedly inflamed distal rectum with ulceration, rectal polyp benign. Acute ulceration of rectum.    GIVENS CAPSULE STUDY N/A 01/12/2021   Procedure: GIVENS CAPSULE STUDY;  Surgeon: Daneil Dolin, MD;  [facility];  Service: Endoscopy;  Laterality: N/A;  8:30am   h/o cardiac cath     LAPAROSCOPIC GASTRIC SLEEVE RESECTION  06/2012   Dr Mickie Bail [facility])    Current Outpatient Medications  Medication Sig Dispense Refill   ascorbic acid (VITAMIN C) 500 MG tablet Take 500 mg by mouth daily.     cetirizine (ZYRTEC) 10 MG tablet Take 10 mg by mouth as needed for allergies.     clonazePAM (KLONOPIN) 1 MG tablet Take 1 mg by mouth 2 (two) times daily.      cyclobenzaprine (FLEXERIL) 5 MG tablet Take 1 tablet (5 mg total) by mouth 3 (three) times daily as needed for muscle spasms. Only as needed. (Patient taking differently: Take  5 mg by mouth at bedtime. Marland Kitchen) 30 tablet 1   ferrous sulfate 325 (65 FE) MG tablet Take 325 mg by mouth daily with breakfast.     fluticasone (FLONASE) 50 MCG/ACT nasal spray Place 2 sprays into both nostrils daily as needed for congestion.  5   HYDROcodone-acetaminophen (NORCO/VICODIN) 5-325 MG tablet Take 1 tablet by mouth every 6 (six) hours as needed for moderate pain.     losartan (COZAAR) 25 MG tablet Take 0.5 tablets (12.5 mg total) by mouth daily. 45 tablet 3   Multiple Vitamin (MULTIVITAMIN WITH MINERALS) TABS tablet Take 1 tablet by mouth daily.     omeprazole (PRILOSEC)  20 MG capsule Take 20 mg by mouth at bedtime.     pravastatin (PRAVACHOL) 40 MG tablet Take 40 mg by mouth daily.     sertraline (ZOLOFT) 100 MG tablet Take 100 mg by mouth daily.     acetaminophen (TYLENOL) 500 MG tablet Take 1,000 mg by mouth 2 (two) times daily as needed for moderate pain or headache.      B COMPLEX VITAMINS SL Place 1 Dose under the tongue daily. Liquid     bisacodyl (DULCOLAX) 5 MG EC tablet Take 10 mg by mouth at bedtime.     Calcium Carb-Cholecalciferol (CALCIUM/VITAMIN D PO) Take 1 tablet by mouth daily.     docusate sodium (COLACE) 100 MG capsule Take 300 mg by mouth at bedtime.     Flaxseed, Linseed, (FLAX SEED OIL) 1000 MG CAPS Take 1,000 mg by mouth daily.      lubiprostone (AMITIZA) 24 MCG capsule TAKE 1 CAPSULE BY MOUTH TWICE DAILY WITH FOOD 60 capsule 5   methylPREDNISolone (MEDROL DOSEPAK) 4 MG TBPK tablet Take as directed 21 tablet 0   zinc gluconate 50 MG tablet Take 25 mg by mouth daily.      No current facility-administered medications for this visit.    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[place])  Tobacco Use   Smoking status: Every Day    Packs/day: 0.25    Years: 36.00    Pack years: 9.00    Types: Cigarettes   Smokeless tobacco: Never  Vaping Use   Vaping Use: Never used  Substance and Sexual Activity   Alcohol use: Yes    Alcohol/week: 0.0 standard drinks    Comment: occasionally     Drug use: No   Sexual activity: Yes    Partners: Male  Other Topics Concern   Not on file  Social History Narrative   Moving to [place] to care for brother w/ brain tumor   Lives w/ 2 daughters & 4 grandsons   Social Determinants of Health   Financial Resource Strain: Not on file  Food Insecurity: Not on file  Transportation Needs: Not on file  Physical Activity: Not on file  Stress: Not on file  Social Connections: Not on file  Intimate Partner Violence: Not on file     Review of Systems   Gen: Denies any fever, chills, fatigue, weight loss, lack of appetite.  CV: Denies chest pain, heart palpitations, peripheral edema, syncope.  Resp: Denies shortness of breath at rest or with exertion. Denies wheezing or cough.  GI: see HPI GU : Denies urinary burning, urinary frequency, urinary hesitancy MS: chronic pain Derm: Denies rash, itching, dry skin Psych: +depression Heme: Denies bruising, bleeding, and enlarged lymph nodes.   Physical Exam   BP 128/76    Pulse 62    Temp (!) 97.3 F (36.3 C)    Ht 6' (1.829 m)    Wt 181 lb 6.4 oz (82.3 kg)    BMI 24.60 kg/m  General:   Alert and oriented. Pleasant and cooperative. Well-nourished and well-developed.  Head:  Normocephalic and atraumatic. Eyes:  Without icterus Abdomen:  +BS, soft, non-tender and non-distended. Completed with patient in chair Neurologic:  Alert and  oriented x4;  grossly normal neurologically. Skin:  Intact without significant lesions or rashes. Psych:  Alert and cooperative. Normal mood and affect.   Assessment   Chistopher Mangino. is a 61 y.o. male presenting today in follow-up with a history of chronic opioid-induced constipation, severe spinal stenosis, diagnosed with IDA 2021 with ferritin 11 in June 2021, fatty liver with splenomegaly and changes of portal hypertension on EGD (Grade 1-2 varices, portal gastropathy), following with serial ultrasounds. Not a candidate for non-selective beta-blocker due  to orthostatic hypotension. Capsule study done with erosions, no mass. EGD surveillance due in 2023.   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 Will continue with serial imaging. EGD due later this year.   Constipation: continue with current regimen.      PLAN    Nov 2023 EGD US abdomen due now Continue current bowel regimen Return 6 months   Annitta Needs, PhD, [facility]

## 2021-09-30 ENCOUNTER — Other Ambulatory Visit: Payer: Self-pay

## 2021-09-30 ENCOUNTER — Ambulatory Visit (HOSPITAL_COMMUNITY)
Admission: RE | Admit: 2021-09-30 | Discharge: 2021-09-30 | Disposition: A | Discharge status: Home / Self Care | Payer: Medicare HMO | Source: Ambulatory Visit | Attending: Gastroenterology | Admitting: Gastroenterology

## 2021-09-30 DIAGNOSIS — K760 Fatty (change of) liver, not elsewhere classified: Secondary | ICD-10-CM | POA: Diagnosis not present

## 2021-09-30 DIAGNOSIS — Z0001 Encounter for general adult medical examination with abnormal findings: Secondary | ICD-10-CM | POA: Diagnosis not present

## 2021-09-30 DIAGNOSIS — K766 Portal hypertension: Secondary | ICD-10-CM | POA: Diagnosis not present

## 2021-09-30 DIAGNOSIS — K7581 Nonalcoholic steatohepatitis (NASH): Secondary | ICD-10-CM | POA: Diagnosis not present

## 2021-09-30 DIAGNOSIS — I10 Essential (primary) hypertension: Secondary | ICD-10-CM | POA: Diagnosis not present

## 2021-09-30 DIAGNOSIS — E039 Hypothyroidism, unspecified: Secondary | ICD-10-CM | POA: Diagnosis not present

## 2021-09-30 DIAGNOSIS — Z125 Encounter for screening for malignant neoplasm of prostate: Secondary | ICD-10-CM | POA: Diagnosis not present

## 2021-09-30 DIAGNOSIS — K8020 Calculus of gallbladder without cholecystitis without obstruction: Secondary | ICD-10-CM | POA: Diagnosis not present

## 2021-10-04 DIAGNOSIS — E611 Iron deficiency: Secondary | ICD-10-CM | POA: Diagnosis not present

## 2021-10-04 DIAGNOSIS — K909 Intestinal malabsorption, unspecified: Secondary | ICD-10-CM | POA: Diagnosis not present

## 2021-10-05 DIAGNOSIS — R69 Illness, unspecified: Secondary | ICD-10-CM | POA: Diagnosis not present

## 2021-10-05 DIAGNOSIS — F331 Major depressive disorder, recurrent, moderate: Secondary | ICD-10-CM | POA: Diagnosis not present

## 2021-10-05 DIAGNOSIS — F418 Other specified anxiety disorders: Secondary | ICD-10-CM | POA: Diagnosis not present

## 2021-10-27 DIAGNOSIS — R69 Illness, unspecified: Secondary | ICD-10-CM | POA: Diagnosis not present

## 2021-10-27 DIAGNOSIS — F418 Other specified anxiety disorders: Secondary | ICD-10-CM | POA: Diagnosis not present

## 2021-10-27 DIAGNOSIS — F331 Major depressive disorder, recurrent, moderate: Secondary | ICD-10-CM | POA: Diagnosis not present

## 2021-11-14 DIAGNOSIS — M5416 Radiculopathy, lumbar region: Secondary | ICD-10-CM | POA: Diagnosis not present

## 2021-11-15 DIAGNOSIS — M778 Other enthesopathies, not elsewhere classified: Secondary | ICD-10-CM | POA: Diagnosis not present

## 2021-11-15 DIAGNOSIS — L110 Acquired keratosis follicularis: Secondary | ICD-10-CM | POA: Diagnosis not present

## 2021-11-15 DIAGNOSIS — M79671 Pain in right foot: Secondary | ICD-10-CM | POA: Diagnosis not present

## 2021-12-12 DIAGNOSIS — M5416 Radiculopathy, lumbar region: Secondary | ICD-10-CM | POA: Diagnosis not present

## 2021-12-20 ENCOUNTER — Telehealth (INDEPENDENT_AMBULATORY_CARE_PROVIDER_SITE_OTHER): Payer: Medicare HMO | Admitting: Adult Health

## 2021-12-20 DIAGNOSIS — Z9989 Dependence on other enabling machines and devices: Secondary | ICD-10-CM

## 2021-12-20 DIAGNOSIS — G4733 Obstructive sleep apnea (adult) (pediatric): Secondary | ICD-10-CM

## 2021-12-20 NOTE — Progress Notes (Signed)
PATIENT: Jerome [name]. DOB: 1960/09/18  REASON FOR VISIT: follow up HISTORY FROM: patient  Virtual Visit via Video Note  I connected with Jerome [name]. on 12/20/21 at  1:30 PM EDT by a video enabled telemedicine application located remotely at [facility] and verified that I am speaking with the correct person using two identifiers who was located at their own home.   I discussed the limitations of evaluation and management by telemedicine and the availability of in person appointments. The patient expressed understanding and agreed to proceed.   PATIENT: Jerome [name]. DOB: 1960/10/22  REASON FOR VISIT: follow up HISTORY FROM: patient  HISTORY OF PRESENT ILLNESS: Today 12/20/21: Mr. Stripling is a 61 year old male with a history of OSA on CPAP. He returns today for follow-up. CPAP reports shows that he used machine 26/30 days. Used >4 hours for compliance of 80 %. Average usage is 5 hours and 45 minutes.  Residual AHI is 6.7 on 6 to 16 cm of water with EPR 3 . missed some days d/t sleeping in the recliner d/t sciatic nerve pain. Otherwise no new issues with CPAP.  12/21/20:Mr. Schoch is a 61 year old male with a history of OSA on CPAP. He joins me today for a video visit. His download indicates that he uses his machine 28/30 for compliance of 93.3%. he used his machine > 4 hours for compliance of 86.7%. On average he uses his machine 5 hours 43 mins.  His residual AHI is 5.3 on 6 to 16 cm of water with EPR of 3.  He reports that the CPAP is working well for him.  He denies any new issues.  06/22/20: Mr. Mehringer is a 61 year old male with a history of OSA on CPAP.  His download indicates that he uses his machine 29 out of 30 days for compliance of 93.5%.  He uses machine greater than 4 hours for compliance of 83.9%.  On average he uses his machine 6 hours and 26 minutes.  His residual AHI is 5.5 on 6 to 16 cm of water with EPR 3.  He reports that the CPAP is  working well for him.  He has noticed a significant difference in how he feels.  He is not happy with the full facemask.  He states that it makes the bridge of his nose very sore.  He is currently putting Band-Aids across his nose to add more comfort.  He returns today for virtual visit  HISTORY 06/10/20:   Mr. Hopfensperger is a 61 year old male with a history of obstructive sleep apnea on CPAP.  His download indicates that he uses machine 27 out of 30 days for compliance of 90%.  He uses machine greater than 4 hours for compliance of 73.3%.  On average he uses his machine 6 hours and 50 minutes.  His residual AHI is 5.4 on 6 to 16 cm of water.  He reports that he has noticed the biggest change since he started using CPAP.  He is having a hard time using a full facemask.  He states that it rubs a spot on the top of his nose.  He returns today for follow-up.   He does note that he just got machine October 20 so we are not quite at 31 days yet for his initial CPAP visit.  REVIEW OF SYSTEMS: Out of a complete 14 system review of symptoms, the patient complains only of the  following symptoms, and all other reviewed systems are negative.  See HPI  ALLERGIES: Allergies  Allergen Reactions   Aspirin Nausea And Vomiting    Due to gastric sleeve surgery    Codeine Nausea And Vomiting   Nsaids Anxiety    Due to gastric sleeve surgery  N/V    HOME MEDICATIONS: Outpatient Medications Prior to Visit  Medication Sig Dispense Refill   acetaminophen (TYLENOL) 500 MG tablet Take 1,000 mg by mouth 2 (two) times daily as needed for moderate pain or headache.      ascorbic acid (VITAMIN C) 500 MG tablet Take 500 mg by mouth daily.     bisacodyl (DULCOLAX) 5 MG EC tablet Take 10 mg by mouth at bedtime.     Calcium Carb-Cholecalciferol (CALCIUM/VITAMIN D PO) Take 1 tablet by mouth daily.     cetirizine (ZYRTEC) 10 MG tablet Take 10 mg by mouth as needed for allergies.     clonazePAM (KLONOPIN) 1 MG tablet Take 1  mg by mouth 2 (two) times daily.      cyclobenzaprine (FLEXERIL) 5 MG tablet Take 1 tablet (5 mg total) by mouth 3 (three) times daily as needed for muscle spasms. Only as needed. (Patient taking differently: Take 5 mg by mouth at bedtime. Marland Kitchen) 30 tablet 1   docusate sodium (COLACE) 100 MG capsule Take 300 mg by mouth at bedtime.     ferrous sulfate 325 (65 FE) MG tablet Take 325 mg by mouth daily with breakfast.     Flaxseed, Linseed, (FLAX SEED OIL) 1000 MG CAPS Take 1,000 mg by mouth daily.      fluticasone (FLONASE) 50 MCG/ACT nasal spray Place 2 sprays into both nostrils daily as needed for congestion.  5   HYDROcodone-acetaminophen (NORCO/VICODIN) 5-325 MG tablet Take 1 tablet by mouth every 6 (six) hours as needed for moderate pain.     losartan (COZAAR) 25 MG tablet Take 0.5 tablets (12.5 mg total) by mouth daily. 45 tablet 3   lubiprostone (AMITIZA) 24 MCG capsule TAKE 1 CAPSULE BY MOUTH TWICE DAILY WITH FOOD 60 capsule 5   Multiple Vitamin (MULTIVITAMIN WITH MINERALS) TABS tablet Take 1 tablet by mouth daily.     omeprazole (PRILOSEC) 20 MG capsule Take 20 mg by mouth at bedtime.     pravastatin (PRAVACHOL) 40 MG tablet Take 40 mg by mouth daily.     sertraline (ZOLOFT) 100 MG tablet Take 100 mg by mouth daily.     No facility-administered medications prior to visit.    PAST MEDICAL HISTORY: Past Medical History:  Diagnosis Date   Anxiety    DJD (degenerative joint disease)    H/O cardiovascular stress test 2013   H/O echocardiogram 2012   for cad, htn & obesity   History of cardiac catheterization    a. normal cors by cath in 2011   Hypertension    Obesity    Spinal stenosis    Thrombocytopenia ([place])     PAST SURGICAL HISTORY: Past Surgical History:  Procedure Laterality Date   BACK SURGERY     x 4   BIOPSY  05/27/2020   Procedure: BIOPSY;  Surgeon: Daneil Dolin, MD;  [facility];  Service: Endoscopy;;   BREAST REDUCTION SURGERY     CARDIAC  CATHETERIZATION  2011   5 frech sheath   COLONOSCOPY N/A 10/14/2012   [id number]:[id number] rectum and colon   COLONOSCOPY WITH PROPOFOL N/A 05/27/2020   normal   CYST EXCISION Right 08/08/2016  Procedure: EXCISION OF RIGHT INGUINAL SUBCUTANEOUS MASS (LIKELY SEBACEIYS CYST);  Surgeon: Vickie Epley, MD;  Location: [facility];  Service: General;  Laterality: Right;  7cm x 4cm mass; dermabond dressing   ESOPHAGOGASTRODUODENOSCOPY (EGD) WITH PROPOFOL N/A 05/27/2020   Grade 1-2 esophageal varices, possible Barrett's s/p biopsy (negative Barretts), portal gastropathy. Gastropathy could be source of occult GI bleeding. Negative H.pylori.   FLEXIBLE SIGMOIDOSCOPY N/A 10/15/2013   markedly inflamed distal rectum with ulceration, rectal polyp benign. Acute ulceration of rectum.    GIVENS CAPSULE STUDY N/A 01/12/2021   Procedure: GIVENS CAPSULE STUDY;  Surgeon: Daneil Dolin, MD;  [facility];  Service: Endoscopy;  Laterality: N/A;  8:30am   h/o cardiac cath     LAPAROSCOPIC GASTRIC SLEEVE RESECTION  06/2012   Dr Mickie Bail [facility])    FAMILY HISTORY: Family History  Problem Relation Age of Onset   Heart attack Father    Colon cancer Paternal Grandmother    Brain cancer Brother 63    SOCIAL HISTORY: 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[place])  Tobacco Use   Smoking status: Every Day    Packs/day: 0.25    Years: 36.00    Pack years: 9.00    Types: Cigarettes   Smokeless tobacco: Never  Vaping Use   Vaping Use: Never used  Substance and Sexual Activity   Alcohol use: Yes    Alcohol/week: 0.0 standard drinks    Comment: occasionally    Drug use: No   Sexual activity: Yes    Partners: Male  Other Topics Concern   Not on file  Social History Narrative   Moving  to [place] care for brother w/ brain tumor   Lives w/ 2 daughters & 4 grandsons   Social Determinants of Health   Financial Resource Strain: Not on file  Food Insecurity: Not on file  Transportation Needs: Not on file  Physical Activity: Not on file  Stress: Not on file  Social Connections: Not on file  Intimate Partner Violence: Not on file      PHYSICAL EXAM Generalized: Well developed, in no acute distress   Neurological examination  Mentation: Alert oriented to time, place, history taking. Follows all commands speech and language fluent Cranial nerve II-XII:Extraocular movements were full. Facial symmetry noted. Head turning and shoulder shrug  were normal and symmetric.   DIAGNOSTIC DATA (LABS, IMAGING, TESTING) - I reviewed patient records, labs, notes, testing and imaging myself where available.  Lab Results  Component Value Date   WBC 5.4 11/16/2020   HGB 16.5 11/16/2020   HCT 48.5 11/16/2020   MCV 92.7 11/16/2020   PLT 123 (L) 11/16/2020      Component Value Date/Time   NA 138 11/16/2020 1405   K 3.9 11/16/2020 1405   CL 102 11/16/2020 1405   CO2 30 11/16/2020 1405   GLUCOSE 79 11/16/2020 1405   BUN 11 11/16/2020 1405   CREATININE 0.84 11/16/2020 1405   CALCIUM 9.3 11/16/2020 1405   PROT 6.4 11/16/2020 1405   ALBUMIN 4.4 07/27/2013 1128   AST 15 11/16/2020 1405   ALT 13 11/16/2020 1405   ALKPHOS 87 07/27/2013 1128   BILITOT 0.7 11/16/2020 1405   GFRNONAA 97 11/16/2020 1405  GFRAA 112 11/16/2020 1405    Lab Results  Component Value Date   TSH 1.20 11/29/2020      ASSESSMENT AND PLAN 61 y.o. year old male  has a past medical history of Anxiety, DJD (degenerative joint disease), H/O cardiovascular stress test (2013), H/O echocardiogram (2012), History of cardiac catheterization, Hypertension, Obesity, Spinal stenosis, and Thrombocytopenia ([place]). here with:  OSA on CPAP  CPAP compliance excellent Residual AHI is good Encouraged patient to  continue using CPAP nightly and > 4 hours each night F/U in 6 months or sooner if needed  Ward Givens, MSN, NP-C 12/20/2021, 1:15 PM [facility] [address] [place], [place] [address] [phone]

## 2022-01-05 DIAGNOSIS — J449 Chronic obstructive pulmonary disease, unspecified: Secondary | ICD-10-CM | POA: Diagnosis not present

## 2022-01-05 DIAGNOSIS — R1012 Left upper quadrant pain: Secondary | ICD-10-CM | POA: Diagnosis not present

## 2022-01-05 DIAGNOSIS — S3993XA Unspecified injury of pelvis, initial encounter: Secondary | ICD-10-CM | POA: Diagnosis not present

## 2022-01-05 DIAGNOSIS — Z981 Arthrodesis status: Secondary | ICD-10-CM | POA: Diagnosis not present

## 2022-01-05 DIAGNOSIS — S3991XA Unspecified injury of abdomen, initial encounter: Secondary | ICD-10-CM | POA: Diagnosis not present

## 2022-01-05 DIAGNOSIS — R0781 Pleurodynia: Secondary | ICD-10-CM | POA: Diagnosis not present

## 2022-01-05 DIAGNOSIS — Z886 Allergy status to analgesic agent status: Secondary | ICD-10-CM | POA: Diagnosis not present

## 2022-01-05 DIAGNOSIS — S161XXA Strain of muscle, fascia and tendon at neck level, initial encounter: Secondary | ICD-10-CM | POA: Diagnosis not present

## 2022-01-05 DIAGNOSIS — M479 Spondylosis, unspecified: Secondary | ICD-10-CM | POA: Diagnosis not present

## 2022-01-05 DIAGNOSIS — R519 Headache, unspecified: Secondary | ICD-10-CM | POA: Diagnosis not present

## 2022-01-05 DIAGNOSIS — M542 Cervicalgia: Secondary | ICD-10-CM | POA: Diagnosis not present

## 2022-01-05 DIAGNOSIS — R52 Pain, unspecified: Secondary | ICD-10-CM | POA: Diagnosis not present

## 2022-01-05 DIAGNOSIS — S4990XA Unspecified injury of shoulder and upper arm, unspecified arm, initial encounter: Secondary | ICD-10-CM | POA: Diagnosis not present

## 2022-01-05 DIAGNOSIS — S8012XA Contusion of left lower leg, initial encounter: Secondary | ICD-10-CM | POA: Diagnosis not present

## 2022-01-05 DIAGNOSIS — Z885 Allergy status to narcotic agent status: Secondary | ICD-10-CM | POA: Diagnosis not present

## 2022-01-05 DIAGNOSIS — M25519 Pain in unspecified shoulder: Secondary | ICD-10-CM | POA: Diagnosis not present

## 2022-01-05 DIAGNOSIS — M25512 Pain in left shoulder: Secondary | ICD-10-CM | POA: Diagnosis not present

## 2022-01-05 DIAGNOSIS — S0990XA Unspecified injury of head, initial encounter: Secondary | ICD-10-CM | POA: Diagnosis not present

## 2022-01-05 DIAGNOSIS — K8020 Calculus of gallbladder without cholecystitis without obstruction: Secondary | ICD-10-CM | POA: Diagnosis not present

## 2022-01-05 DIAGNOSIS — S299XXA Unspecified injury of thorax, initial encounter: Secondary | ICD-10-CM | POA: Diagnosis not present

## 2022-01-05 DIAGNOSIS — K5790 Diverticulosis of intestine, part unspecified, without perforation or abscess without bleeding: Secondary | ICD-10-CM | POA: Diagnosis not present

## 2022-01-05 DIAGNOSIS — M8588 Other specified disorders of bone density and structure, other site: Secondary | ICD-10-CM | POA: Diagnosis not present

## 2022-01-05 DIAGNOSIS — Z743 Need for continuous supervision: Secondary | ICD-10-CM | POA: Diagnosis not present

## 2022-01-05 DIAGNOSIS — F17210 Nicotine dependence, cigarettes, uncomplicated: Secondary | ICD-10-CM | POA: Diagnosis not present

## 2022-01-05 DIAGNOSIS — I700 Atherosclerosis of aorta: Secondary | ICD-10-CM | POA: Diagnosis not present

## 2022-01-05 DIAGNOSIS — S301XXA Contusion of abdominal wall, initial encounter: Secondary | ICD-10-CM | POA: Diagnosis not present

## 2022-01-05 DIAGNOSIS — R0789 Other chest pain: Secondary | ICD-10-CM | POA: Diagnosis not present

## 2022-01-11 DIAGNOSIS — H6123 Impacted cerumen, bilateral: Secondary | ICD-10-CM | POA: Diagnosis not present

## 2022-01-11 DIAGNOSIS — H9312 Tinnitus, left ear: Secondary | ICD-10-CM | POA: Diagnosis not present

## 2022-01-11 DIAGNOSIS — H9042 Sensorineural hearing loss, unilateral, left ear, with unrestricted hearing on the contralateral side: Secondary | ICD-10-CM | POA: Diagnosis not present

## 2022-01-17 DIAGNOSIS — L110 Acquired keratosis follicularis: Secondary | ICD-10-CM | POA: Diagnosis not present

## 2022-01-17 DIAGNOSIS — M778 Other enthesopathies, not elsewhere classified: Secondary | ICD-10-CM | POA: Diagnosis not present

## 2022-01-17 DIAGNOSIS — M79672 Pain in left foot: Secondary | ICD-10-CM | POA: Diagnosis not present

## 2022-01-17 DIAGNOSIS — M79675 Pain in left toe(s): Secondary | ICD-10-CM | POA: Diagnosis not present

## 2022-01-18 ENCOUNTER — Other Ambulatory Visit (HOSPITAL_COMMUNITY): Payer: Self-pay | Admitting: Internal Medicine

## 2022-01-18 DIAGNOSIS — F411 Generalized anxiety disorder: Secondary | ICD-10-CM | POA: Diagnosis not present

## 2022-01-18 DIAGNOSIS — R69 Illness, unspecified: Secondary | ICD-10-CM | POA: Diagnosis not present

## 2022-01-18 DIAGNOSIS — Z6824 Body mass index (BMI) 24.0-24.9, adult: Secondary | ICD-10-CM | POA: Diagnosis not present

## 2022-01-18 DIAGNOSIS — H9042 Sensorineural hearing loss, unilateral, left ear, with unrestricted hearing on the contralateral side: Secondary | ICD-10-CM | POA: Diagnosis not present

## 2022-01-18 DIAGNOSIS — H9312 Tinnitus, left ear: Secondary | ICD-10-CM | POA: Diagnosis not present

## 2022-01-18 DIAGNOSIS — K7581 Nonalcoholic steatohepatitis (NASH): Secondary | ICD-10-CM | POA: Diagnosis not present

## 2022-01-18 DIAGNOSIS — M25522 Pain in left elbow: Secondary | ICD-10-CM

## 2022-01-18 DIAGNOSIS — T07XXXA Unspecified multiple injuries, initial encounter: Secondary | ICD-10-CM | POA: Diagnosis not present

## 2022-01-18 DIAGNOSIS — I10 Essential (primary) hypertension: Secondary | ICD-10-CM | POA: Diagnosis not present

## 2022-01-18 DIAGNOSIS — F419 Anxiety disorder, unspecified: Secondary | ICD-10-CM | POA: Diagnosis not present

## 2022-01-19 ENCOUNTER — Ambulatory Visit (HOSPITAL_COMMUNITY)
Admission: RE | Admit: 2022-01-19 | Discharge: 2022-01-19 | Disposition: A | Discharge status: Home / Self Care | Payer: Medicare HMO | Source: Ambulatory Visit | Attending: Internal Medicine | Admitting: Internal Medicine

## 2022-01-19 DIAGNOSIS — M25522 Pain in left elbow: Secondary | ICD-10-CM | POA: Insufficient documentation

## 2022-01-20 IMAGING — CT CT CHEST LUNG CANCER SCREENING LOW DOSE W/O CM
1 of 2 series · 10 of 20 positions shown, 13 images · non-contrast
Comparison: 03/06/2019

CLINICAL DATA: 58-year-old male current smoker, 58 pack-year
history of smoking, for follow-up lung cancer screening

EXAM:
CT CHEST WITHOUT CONTRAST LOW-DOSE FOR LUNG CANCER SCREENING
TECHNIQUE: Multidetector CT imaging of the chest was performed following the
standard protocol without IV contrast.

[ct lung segmentation data · axial · 0.75mm/px · z∈[-263,-263]mm · 10 of 348 frames shown]
[frame 1/348  mediastinal]
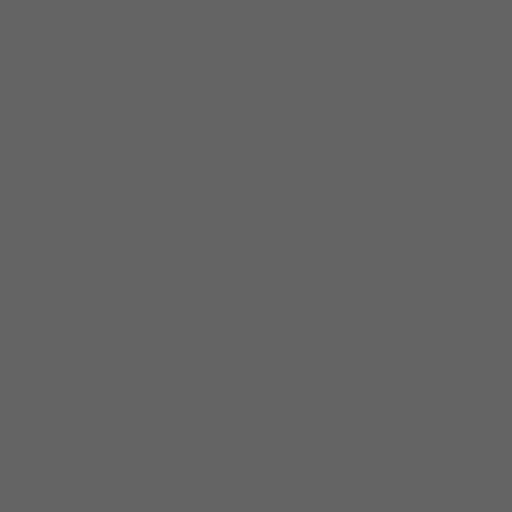
[frame 1/348  lung]
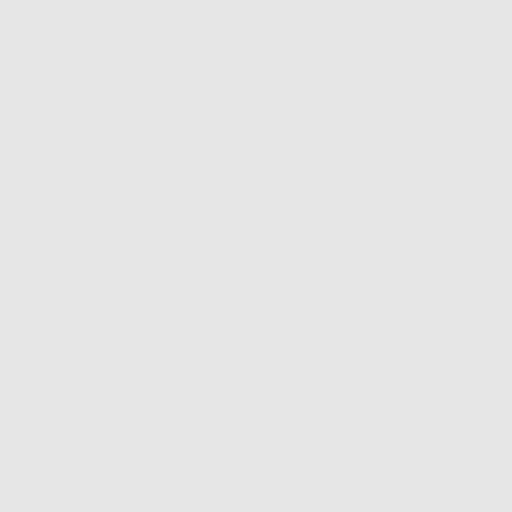
[frame 39/348  lung]
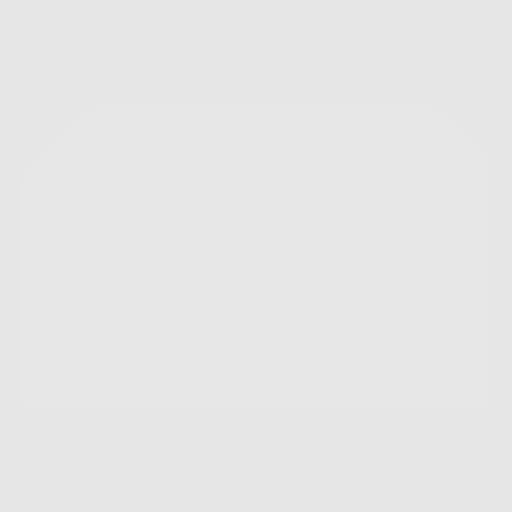
[frame 78/348  lung]
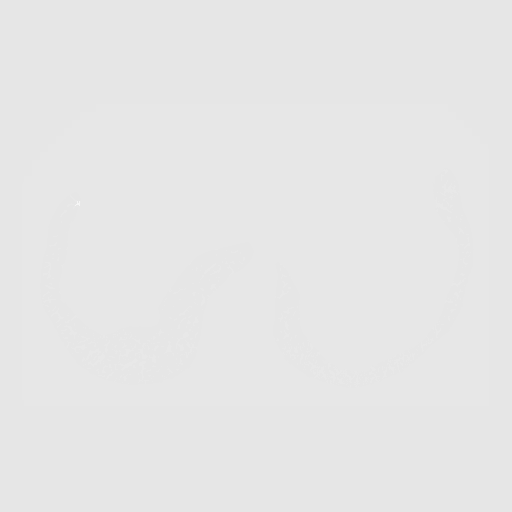
[frame 116/348  lung]
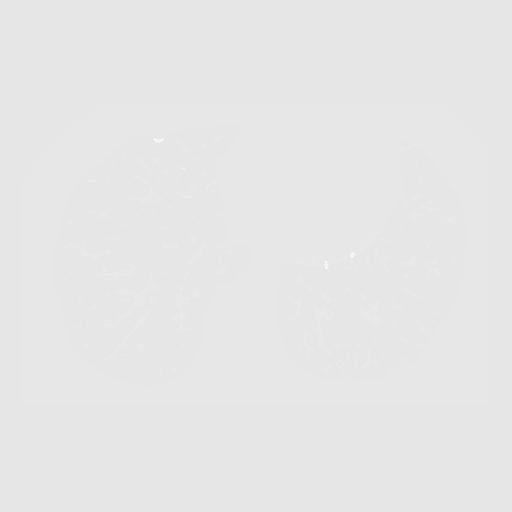
[frame 155/348  mediastinal]
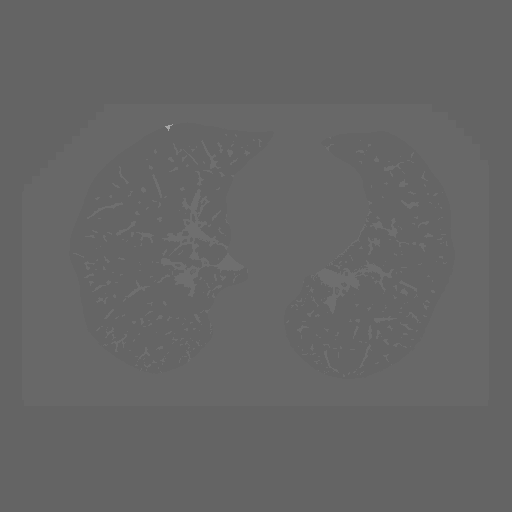
[frame 155/348  lung]
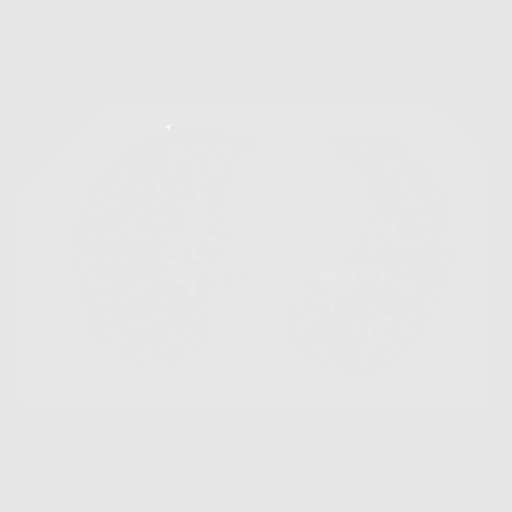
[frame 193/348  lung]
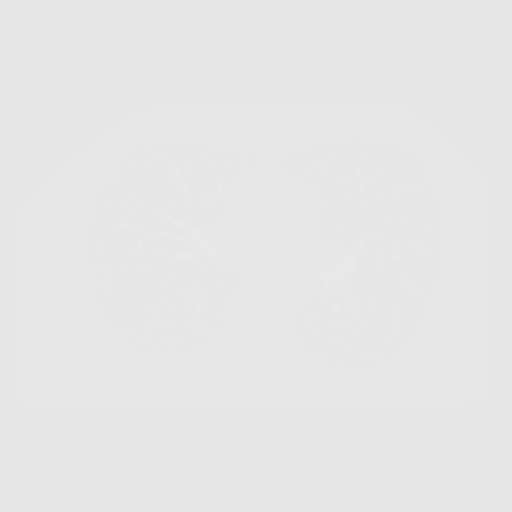
[frame 232/348  lung]
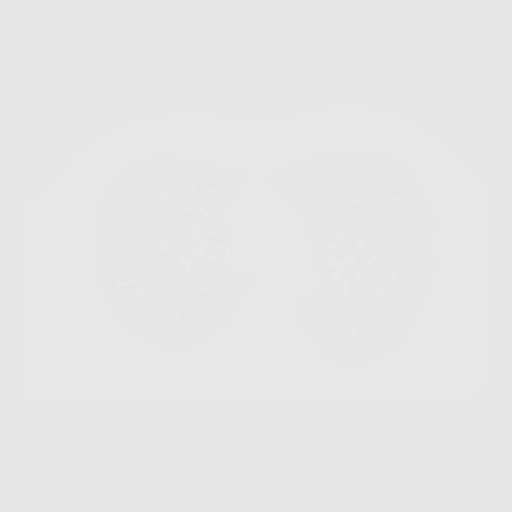
[frame 270/348  lung]
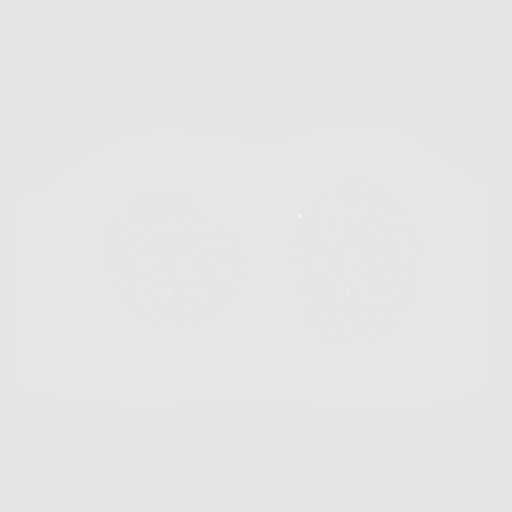
[frame 309/348  mediastinal]
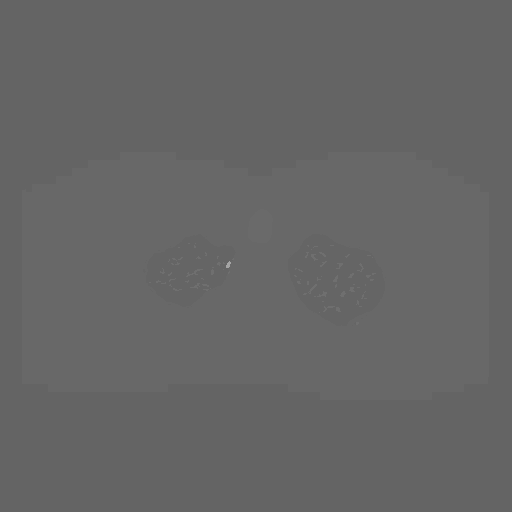
[frame 309/348  lung]
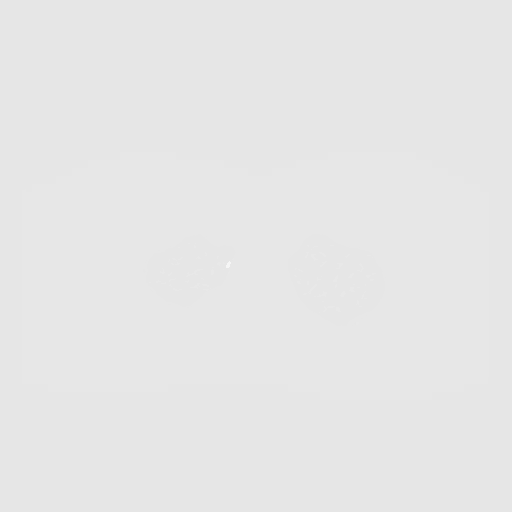
[frame 348/348  lung]
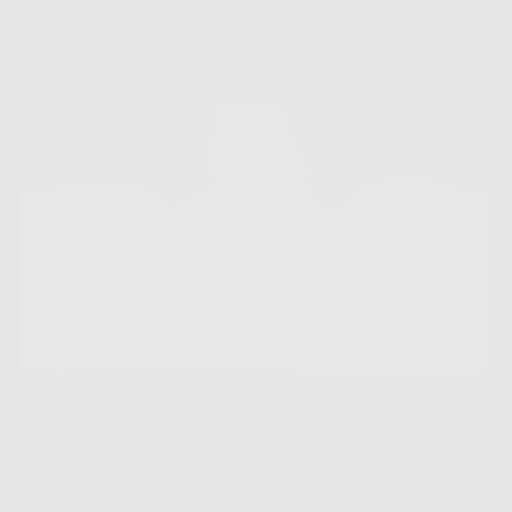

[10 of 20 positions shown; findings below may reference images not displayed]

FINDINGS: Cardiovascular: The heart is normal in size. No pericardial
effusion.

No evidence of thoracic aortic aneurysm.

Mediastinum/Nodes: No suspicious mediastinal lymphadenopathy.

Visualized thyroid is unremarkable.

Lungs/Pleura: Mild centrilobular and paraseptal emphysematous
changes, upper lung predominant.

No focal consolidation.

Scattered small bilateral pulmonary nodules measuring up to 4.8 mm,
grossly unchanged.

No pleural effusion or pneumothorax.

Upper Abdomen: Visualized upper abdomen is notable for postsurgical
changes involving the stomach.

Musculoskeletal: Degenerative changes of the visualized
thoracolumbar spine.
IMPRESSION: Lung-RADS 2, benign appearance or behavior. Continue annual
screening with low-dose chest CT without contrast in 12 months.

Emphysema ([id number]-SOA.1).

## 2022-01-23 DIAGNOSIS — D696 Thrombocytopenia, unspecified: Secondary | ICD-10-CM | POA: Diagnosis not present

## 2022-02-01 DIAGNOSIS — R69 Illness, unspecified: Secondary | ICD-10-CM | POA: Diagnosis not present

## 2022-02-01 DIAGNOSIS — F331 Major depressive disorder, recurrent, moderate: Secondary | ICD-10-CM | POA: Diagnosis not present

## 2022-02-02 DIAGNOSIS — E7800 Pure hypercholesterolemia, unspecified: Secondary | ICD-10-CM | POA: Diagnosis not present

## 2022-02-02 DIAGNOSIS — Z0100 Encounter for examination of eyes and vision without abnormal findings: Secondary | ICD-10-CM | POA: Diagnosis not present

## 2022-02-02 DIAGNOSIS — I10 Essential (primary) hypertension: Secondary | ICD-10-CM | POA: Diagnosis not present

## 2022-02-02 DIAGNOSIS — H5210 Myopia, unspecified eye: Secondary | ICD-10-CM | POA: Diagnosis not present

## 2022-02-13 DIAGNOSIS — M7712 Lateral epicondylitis, left elbow: Secondary | ICD-10-CM | POA: Diagnosis not present

## 2022-02-13 DIAGNOSIS — G5622 Lesion of ulnar nerve, left upper limb: Secondary | ICD-10-CM | POA: Diagnosis not present

## 2022-02-13 DIAGNOSIS — M25522 Pain in left elbow: Secondary | ICD-10-CM | POA: Diagnosis not present

## 2022-02-14 DIAGNOSIS — M47812 Spondylosis without myelopathy or radiculopathy, cervical region: Secondary | ICD-10-CM | POA: Diagnosis not present

## 2022-02-14 DIAGNOSIS — M47817 Spondylosis without myelopathy or radiculopathy, lumbosacral region: Secondary | ICD-10-CM | POA: Diagnosis not present

## 2022-02-20 DIAGNOSIS — M25522 Pain in left elbow: Secondary | ICD-10-CM | POA: Diagnosis not present

## 2022-02-21 DIAGNOSIS — M47812 Spondylosis without myelopathy or radiculopathy, cervical region: Secondary | ICD-10-CM | POA: Diagnosis not present

## 2022-02-28 DIAGNOSIS — M25522 Pain in left elbow: Secondary | ICD-10-CM | POA: Diagnosis not present

## 2022-03-01 ENCOUNTER — Other Ambulatory Visit (HOSPITAL_COMMUNITY): Payer: Self-pay | Admitting: Internal Medicine

## 2022-03-01 DIAGNOSIS — M5412 Radiculopathy, cervical region: Secondary | ICD-10-CM | POA: Diagnosis not present

## 2022-03-01 DIAGNOSIS — K219 Gastro-esophageal reflux disease without esophagitis: Secondary | ICD-10-CM | POA: Diagnosis not present

## 2022-03-01 DIAGNOSIS — M542 Cervicalgia: Secondary | ICD-10-CM

## 2022-03-01 DIAGNOSIS — R350 Frequency of micturition: Secondary | ICD-10-CM | POA: Diagnosis not present

## 2022-03-01 DIAGNOSIS — Z6824 Body mass index (BMI) 24.0-24.9, adult: Secondary | ICD-10-CM | POA: Diagnosis not present

## 2022-03-01 DIAGNOSIS — I10 Essential (primary) hypertension: Secondary | ICD-10-CM | POA: Diagnosis not present

## 2022-03-01 DIAGNOSIS — G894 Chronic pain syndrome: Secondary | ICD-10-CM | POA: Diagnosis not present

## 2022-03-01 DIAGNOSIS — N401 Enlarged prostate with lower urinary tract symptoms: Secondary | ICD-10-CM | POA: Diagnosis not present

## 2022-03-01 DIAGNOSIS — M48062 Spinal stenosis, lumbar region with neurogenic claudication: Secondary | ICD-10-CM | POA: Diagnosis not present

## 2022-03-02 ENCOUNTER — Ambulatory Visit (HOSPITAL_COMMUNITY)
Admission: RE | Admit: 2022-03-02 | Discharge: 2022-03-02 | Disposition: A | Discharge status: Home / Self Care | Payer: Medicare HMO | Source: Ambulatory Visit | Attending: Internal Medicine | Admitting: Internal Medicine

## 2022-03-02 DIAGNOSIS — M542 Cervicalgia: Secondary | ICD-10-CM | POA: Diagnosis not present

## 2022-03-02 DIAGNOSIS — M25522 Pain in left elbow: Secondary | ICD-10-CM | POA: Diagnosis not present

## 2022-03-06 DIAGNOSIS — Z760 Encounter for issue of repeat prescription: Secondary | ICD-10-CM | POA: Diagnosis not present

## 2022-03-06 DIAGNOSIS — Z5181 Encounter for therapeutic drug level monitoring: Secondary | ICD-10-CM | POA: Diagnosis not present

## 2022-03-06 DIAGNOSIS — M4722 Other spondylosis with radiculopathy, cervical region: Secondary | ICD-10-CM | POA: Diagnosis not present

## 2022-03-06 DIAGNOSIS — G894 Chronic pain syndrome: Secondary | ICD-10-CM | POA: Diagnosis not present

## 2022-03-06 DIAGNOSIS — M5416 Radiculopathy, lumbar region: Secondary | ICD-10-CM | POA: Diagnosis not present

## 2022-03-06 DIAGNOSIS — D696 Thrombocytopenia, unspecified: Secondary | ICD-10-CM | POA: Diagnosis not present

## 2022-03-06 DIAGNOSIS — M533 Sacrococcygeal disorders, not elsewhere classified: Secondary | ICD-10-CM | POA: Diagnosis not present

## 2022-03-09 DIAGNOSIS — M25522 Pain in left elbow: Secondary | ICD-10-CM | POA: Diagnosis not present

## 2022-03-20 DIAGNOSIS — M25522 Pain in left elbow: Secondary | ICD-10-CM | POA: Diagnosis not present

## 2022-03-21 DIAGNOSIS — M79675 Pain in left toe(s): Secondary | ICD-10-CM | POA: Diagnosis not present

## 2022-03-21 DIAGNOSIS — M779 Enthesopathy, unspecified: Secondary | ICD-10-CM | POA: Diagnosis not present

## 2022-03-21 DIAGNOSIS — M79672 Pain in left foot: Secondary | ICD-10-CM | POA: Diagnosis not present

## 2022-03-21 DIAGNOSIS — S93332D Other subluxation of left foot, subsequent encounter: Secondary | ICD-10-CM | POA: Diagnosis not present

## 2022-03-23 DIAGNOSIS — E782 Mixed hyperlipidemia: Secondary | ICD-10-CM | POA: Diagnosis not present

## 2022-03-23 DIAGNOSIS — M25522 Pain in left elbow: Secondary | ICD-10-CM | POA: Diagnosis not present

## 2022-03-23 DIAGNOSIS — I10 Essential (primary) hypertension: Secondary | ICD-10-CM | POA: Diagnosis not present

## 2022-03-28 ENCOUNTER — Encounter: Payer: Self-pay | Admitting: Gastroenterology

## 2022-03-28 ENCOUNTER — Ambulatory Visit (INDEPENDENT_AMBULATORY_CARE_PROVIDER_SITE_OTHER): Payer: Medicare HMO | Admitting: Gastroenterology

## 2022-03-28 ENCOUNTER — Encounter: Payer: Self-pay | Admitting: *Deleted

## 2022-03-28 VITALS — BP 129/78 | HR 70 | Temp 97.7°F | Ht 72.0 in | Wt 182.4 lb

## 2022-03-28 DIAGNOSIS — K5900 Constipation, unspecified: Secondary | ICD-10-CM

## 2022-03-28 DIAGNOSIS — K760 Fatty (change of) liver, not elsewhere classified: Secondary | ICD-10-CM | POA: Insufficient documentation

## 2022-03-28 NOTE — Patient Instructions (Signed)
Let me know how is best to prescribe the vaccinations!  We are arranging an upper endoscopy with Dr. Gala Romney in the near future.  I have also ordered an ultrasound with elastography.  We will see you in 6 months!   I enjoyed seeing you again today! As you know, I value our relationship and want to provide genuine, compassionate, and quality care. I welcome your feedback. If you receive a survey regarding your visit,  I greatly appreciate you taking time to fill this out. See you next time!  Annitta Needs, PhD, ANP-BC [facility]
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
## 2022-03-29 DIAGNOSIS — M25522 Pain in left elbow: Secondary | ICD-10-CM | POA: Diagnosis not present

## 2022-04-03 DIAGNOSIS — M5417 Radiculopathy, lumbosacral region: Secondary | ICD-10-CM | POA: Diagnosis not present

## 2022-04-03 DIAGNOSIS — M549 Dorsalgia, unspecified: Secondary | ICD-10-CM | POA: Diagnosis not present

## 2022-04-03 DIAGNOSIS — M542 Cervicalgia: Secondary | ICD-10-CM | POA: Diagnosis not present

## 2022-04-03 DIAGNOSIS — M5412 Radiculopathy, cervical region: Secondary | ICD-10-CM | POA: Diagnosis not present

## 2022-04-03 DIAGNOSIS — G894 Chronic pain syndrome: Secondary | ICD-10-CM | POA: Diagnosis not present

## 2022-04-03 DIAGNOSIS — Z5181 Encounter for therapeutic drug level monitoring: Secondary | ICD-10-CM | POA: Diagnosis not present

## 2022-04-03 DIAGNOSIS — M533 Sacrococcygeal disorders, not elsewhere classified: Secondary | ICD-10-CM | POA: Diagnosis not present

## 2022-04-03 DIAGNOSIS — M5416 Radiculopathy, lumbar region: Secondary | ICD-10-CM | POA: Diagnosis not present

## 2022-04-03 DIAGNOSIS — M545 Low back pain, unspecified: Secondary | ICD-10-CM | POA: Diagnosis not present

## 2022-04-03 DIAGNOSIS — D696 Thrombocytopenia, unspecified: Secondary | ICD-10-CM | POA: Diagnosis not present

## 2022-04-03 DIAGNOSIS — G8929 Other chronic pain: Secondary | ICD-10-CM | POA: Diagnosis not present

## 2022-04-03 DIAGNOSIS — M47812 Spondylosis without myelopathy or radiculopathy, cervical region: Secondary | ICD-10-CM | POA: Diagnosis not present

## 2022-04-06 ENCOUNTER — Ambulatory Visit (HOSPITAL_COMMUNITY)
Admission: RE | Admit: 2022-04-06 | Discharge: 2022-04-06 | Disposition: A | Discharge status: Home / Self Care | Payer: Medicare HMO | Source: Ambulatory Visit | Attending: Gastroenterology | Admitting: Gastroenterology

## 2022-04-06 DIAGNOSIS — K7689 Other specified diseases of liver: Secondary | ICD-10-CM | POA: Diagnosis not present

## 2022-04-06 DIAGNOSIS — K760 Fatty (change of) liver, not elsewhere classified: Secondary | ICD-10-CM | POA: Diagnosis not present

## 2022-04-06 DIAGNOSIS — K8020 Calculus of gallbladder without cholecystitis without obstruction: Secondary | ICD-10-CM | POA: Diagnosis not present

## 2022-04-10 DIAGNOSIS — M25522 Pain in left elbow: Secondary | ICD-10-CM | POA: Diagnosis not present

## 2022-04-17 DIAGNOSIS — G5622 Lesion of ulnar nerve, left upper limb: Secondary | ICD-10-CM | POA: Diagnosis not present

## 2022-04-24 ENCOUNTER — Encounter: Payer: Self-pay | Admitting: *Deleted

## 2022-04-24 ENCOUNTER — Telehealth: Payer: Self-pay | Admitting: *Deleted

## 2022-04-24 DIAGNOSIS — M25522 Pain in left elbow: Secondary | ICD-10-CM | POA: Diagnosis not present

## 2022-04-24 NOTE — Telephone Encounter (Signed)
Called pt. Scheduled for EGD with Dr. Gala Romney asa 3 on 11/1 at [place] will mail instructions/pre-op appt.

## 2022-04-27 DIAGNOSIS — G5602 Carpal tunnel syndrome, left upper limb: Secondary | ICD-10-CM | POA: Diagnosis not present

## 2022-04-27 DIAGNOSIS — K1321 Leukoplakia of oral mucosa, including tongue: Secondary | ICD-10-CM | POA: Diagnosis not present

## 2022-04-27 DIAGNOSIS — G5622 Lesion of ulnar nerve, left upper limb: Secondary | ICD-10-CM | POA: Diagnosis not present

## 2022-04-28 DIAGNOSIS — R69 Illness, unspecified: Secondary | ICD-10-CM | POA: Diagnosis not present

## 2022-04-28 DIAGNOSIS — F331 Major depressive disorder, recurrent, moderate: Secondary | ICD-10-CM | POA: Diagnosis not present

## 2022-05-01 DIAGNOSIS — M25522 Pain in left elbow: Secondary | ICD-10-CM | POA: Diagnosis not present

## 2022-05-15 DIAGNOSIS — M7702 Medial epicondylitis, left elbow: Secondary | ICD-10-CM | POA: Diagnosis not present

## 2022-05-18 NOTE — Patient Instructions (Signed)
Jerome [name].  05/18/2022     '@PREFPERIOPPHARMACY'$ @   Your procedure is scheduled on  05/24/2022.   Report to [name] at  0700  A.M.   Call this number if you have problems the morning of surgery:  [phone]  If you experience any cold or flu symptoms such as cough, fever, chills, shortness of breath, etc. between now and your scheduled surgery, please notify us at the above number.   Remember:  Follow the diet instructions given to you by the office.     Take these medicines the morning of surgery with A SIP OF WATER                   zyrtec, clonazepam, hydrocodone (if needed), prilosec, zoloft.     Do not wear jewelry, make-up or nail polish.  Do not wear lotions, powders, or perfumes, or deodorant.  Do not shave 48 hours prior to surgery.  Men may shave face and neck.  Do not bring valuables to the hospital.  [facility] is not responsible for any belongings or valuables.  Contacts, dentures or bridgework may not be worn into surgery.  Leave your suitcase in the car.  After surgery it may be brought to your room.  For patients admitted to the hospital, discharge time will be determined by your treatment team.  Patients discharged the day of surgery will not be allowed to drive home and must have someone with them for 24 hours.    Special instructions:   DO NOT smoke tobacco or vape for 24 hours before your procedure.  Please read over the following fact sheets that you were given. Anesthesia Post-op Instructions and Care and Recovery After Surgery      Upper Endoscopy, Adult, Care After After the procedure, it is common to have a sore throat. It is also common to have: Mild stomach pain or discomfort. Bloating. Nausea. Follow these instructions at home: The instructions below may help you care for yourself at home. Your health care provider may give you more instructions. If you have questions, ask your health care provider. If you were  given a sedative during the procedure, it can affect you for several hours. Do not drive or operate machinery until your health care provider says that it is safe. If you will be going home right after the procedure, plan to have a responsible adult: Take you home from the hospital or clinic. You will not be allowed to drive. Care for you for the time you are told. Follow instructions from your health care provider about what you may eat and drink. Return to your normal activities as told by your health care provider. Ask your health care provider what activities are safe for you. Take over-the-counter and prescription medicines only as told by your health care provider. Contact a health care provider if you: Have a sore throat that lasts longer than one day. Have trouble swallowing. Have a fever. Get help right away if you: Vomit blood or your vomit looks like coffee grounds. Have bloody, black, or tarry stools. Have a very bad sore throat or you cannot swallow. Have difficulty breathing or very bad pain in your chest or abdomen. These symptoms may be an emergency. Get help right away. Call 911. Do not wait to see if the symptoms will go away. Do not drive yourself to the hospital. Summary After the procedure, it is common to have a sore  throat, mild stomach discomfort, bloating, and nausea. If you were given a sedative during the procedure, it can affect you for several hours. Do not drive until your health care provider says that it is safe. Follow instructions from your health care provider about what you may eat and drink. Return to your normal activities as told by your health care provider. This information is not intended to replace advice given to you by your health care provider. Make sure you discuss any questions you have with your health care provider. Document Revised: 10/19/2021 Document Reviewed: 10/19/2021 Elsevier Patient Education  [place] After The following information offers guidance on how to care for yourself after your procedure. Your health care provider may also give you more specific instructions. If you have problems or questions, contact your health care provider. What can I expect after the procedure? After the procedure, it is common to have: Tiredness. Little or no memory about what happened during or after the procedure. Impaired judgment when it comes to making decisions. Nausea or vomiting. Some trouble with balance. Follow these instructions at home: For the time period you were told by your health care provider:  Rest. Do not participate in activities where you could fall or become injured. Do not drive or use machinery. Do not drink alcohol. Do not take sleeping pills or medicines that cause drowsiness. Do not make important decisions or sign legal documents. Do not take care of children on your own. Medicines Take over-the-counter and prescription medicines only as told by your health care provider. If you were prescribed antibiotics, take them as told by your health care provider. Do not stop using the antibiotic even if you start to feel better. Eating and drinking Follow instructions from your health care provider about what you may eat and drink. Drink enough fluid to keep your urine pale yellow. If you vomit: Drink clear fluids slowly and in small amounts as you are able. Clear fluids include water, ice chips, low-calorie sports drinks, and fruit juice that has water added to it (diluted fruit juice). Eat light and bland foods in small amounts as you are able. These foods include bananas, applesauce, rice, lean meats, toast, and crackers. General instructions  Have a responsible adult stay with you for the time you are told. It is important to have someone help care for you until you are awake and alert. If you have sleep apnea, surgery and some medicines can increase your  risk for breathing problems. Follow instructions from your health care provider about wearing your sleep device: When you are sleeping. This includes during daytime naps. While taking prescription pain medicines, sleeping medicines, or medicines that make you drowsy. Do not use any products that contain nicotine or tobacco. These products include cigarettes, chewing tobacco, and vaping devices, such as e-cigarettes. If you need help quitting, ask your health care provider. Contact a health care provider if: You feel nauseous or vomit every time you eat or drink. You feel light-headed. You are still sleepy or having trouble with balance after 24 hours. You get a rash. You have a fever. You have redness or swelling around the IV site. Get help right away if: You have trouble breathing. You have new confusion after you get home. These symptoms may be an emergency. Get help right away. Call 911. Do not wait to see if the symptoms will go away. Do not drive yourself to the hospital. This information is not intended  to replace advice given to you by your health care provider. Make sure you discuss any questions you have with your health care provider. Document Revised: 12/05/2021 Document Reviewed: 12/05/2021 Elsevier Patient Education  [place].

## 2022-05-22 ENCOUNTER — Encounter (HOSPITAL_COMMUNITY): Payer: Self-pay

## 2022-05-22 ENCOUNTER — Encounter (HOSPITAL_COMMUNITY)
Admission: RE | Admit: 2022-05-22 | Discharge: 2022-05-22 | Disposition: A | Discharge status: Home / Self Care | Payer: Medicare HMO | Source: Ambulatory Visit | Attending: Internal Medicine | Admitting: Internal Medicine

## 2022-05-22 VITALS — BP 127/91 | HR 50 | Temp 97.7°F | Resp 18 | Ht 72.0 in | Wt 182.3 lb

## 2022-05-22 DIAGNOSIS — K766 Portal hypertension: Secondary | ICD-10-CM | POA: Diagnosis not present

## 2022-05-22 DIAGNOSIS — D696 Thrombocytopenia, unspecified: Secondary | ICD-10-CM | POA: Diagnosis not present

## 2022-05-22 DIAGNOSIS — Z01818 Encounter for other preprocedural examination: Secondary | ICD-10-CM | POA: Diagnosis not present

## 2022-05-22 DIAGNOSIS — D509 Iron deficiency anemia, unspecified: Secondary | ICD-10-CM | POA: Insufficient documentation

## 2022-05-22 HISTORY — DX: Chronic obstructive pulmonary disease, unspecified: J44.9

## 2022-05-22 HISTORY — DX: Foot drop, left foot: M21.372

## 2022-05-22 LAB — CBC WITH DIFFERENTIAL/PLATELET
Abs Immature Granulocytes: 0.01 K/uL (ref 0.00–0.07)
Basophils Absolute: 0 K/uL (ref 0.0–0.1)
Basophils Relative: 1 %
Eosinophils Absolute: 0.1 K/uL (ref 0.0–0.5)
Eosinophils Relative: 1 %
HCT: 43.8 % (ref 39.0–52.0)
Hemoglobin: 15.1 g/dL (ref 13.0–17.0)
Immature Granulocytes: 0 %
Lymphocytes Relative: 19 %
Lymphs Abs: 1 K/uL (ref 0.7–4.0)
MCH: 32.3 pg (ref 26.0–34.0)
MCHC: 34.5 g/dL (ref 30.0–36.0)
MCV: 93.6 fL (ref 80.0–100.0)
Monocytes Absolute: 0.4 K/uL (ref 0.1–1.0)
Monocytes Relative: 8 %
Neutro Abs: 3.7 K/uL (ref 1.7–7.7)
Neutrophils Relative %: 71 %
Platelets: 125 K/uL — ABNORMAL LOW (ref 150–400)
RBC: 4.68 MIL/uL (ref 4.22–5.81)
RDW: 12.5 % (ref 11.5–15.5)
WBC: 5.3 K/uL (ref 4.0–10.5)
nRBC: 0 % (ref 0.0–0.2)

## 2022-05-24 ENCOUNTER — Other Ambulatory Visit: Payer: Self-pay

## 2022-05-24 ENCOUNTER — Ambulatory Visit (HOSPITAL_BASED_OUTPATIENT_CLINIC_OR_DEPARTMENT_OTHER): Payer: Medicare HMO | Admitting: Anesthesiology

## 2022-05-24 ENCOUNTER — Encounter (HOSPITAL_COMMUNITY)
Admission: RE | Disposition: A | Discharge status: Home / Self Care | Payer: Self-pay | Source: Home / Self Care | Attending: Internal Medicine

## 2022-05-24 ENCOUNTER — Ambulatory Visit (HOSPITAL_COMMUNITY): Payer: Medicare HMO | Admitting: Anesthesiology

## 2022-05-24 ENCOUNTER — Ambulatory Visit (HOSPITAL_COMMUNITY)
Admission: RE | Admit: 2022-05-24 | Discharge: 2022-05-24 | Disposition: A | Discharge status: Home / Self Care | Payer: Medicare HMO | Attending: Internal Medicine | Admitting: Internal Medicine

## 2022-05-24 ENCOUNTER — Encounter (HOSPITAL_COMMUNITY): Payer: Self-pay | Admitting: Internal Medicine

## 2022-05-24 DIAGNOSIS — I10 Essential (primary) hypertension: Secondary | ICD-10-CM | POA: Diagnosis not present

## 2022-05-24 DIAGNOSIS — F17210 Nicotine dependence, cigarettes, uncomplicated: Secondary | ICD-10-CM | POA: Diagnosis not present

## 2022-05-24 DIAGNOSIS — I8500 Esophageal varices without bleeding: Secondary | ICD-10-CM | POA: Insufficient documentation

## 2022-05-24 DIAGNOSIS — D6959 Other secondary thrombocytopenia: Secondary | ICD-10-CM | POA: Insufficient documentation

## 2022-05-24 DIAGNOSIS — D649 Anemia, unspecified: Secondary | ICD-10-CM | POA: Diagnosis not present

## 2022-05-24 DIAGNOSIS — K3189 Other diseases of stomach and duodenum: Secondary | ICD-10-CM | POA: Diagnosis not present

## 2022-05-24 DIAGNOSIS — K766 Portal hypertension: Secondary | ICD-10-CM

## 2022-05-24 DIAGNOSIS — F419 Anxiety disorder, unspecified: Secondary | ICD-10-CM | POA: Diagnosis not present

## 2022-05-24 DIAGNOSIS — K219 Gastro-esophageal reflux disease without esophagitis: Secondary | ICD-10-CM | POA: Diagnosis not present

## 2022-05-24 DIAGNOSIS — K760 Fatty (change of) liver, not elsewhere classified: Secondary | ICD-10-CM | POA: Diagnosis not present

## 2022-05-24 DIAGNOSIS — I8510 Secondary esophageal varices without bleeding: Secondary | ICD-10-CM

## 2022-05-24 DIAGNOSIS — R69 Illness, unspecified: Secondary | ICD-10-CM | POA: Diagnosis not present

## 2022-05-24 DIAGNOSIS — D696 Thrombocytopenia, unspecified: Secondary | ICD-10-CM | POA: Diagnosis not present

## 2022-05-24 DIAGNOSIS — J449 Chronic obstructive pulmonary disease, unspecified: Secondary | ICD-10-CM

## 2022-05-24 DIAGNOSIS — I864 Gastric varices: Secondary | ICD-10-CM | POA: Insufficient documentation

## 2022-05-24 DIAGNOSIS — K2289 Other specified disease of esophagus: Secondary | ICD-10-CM | POA: Insufficient documentation

## 2022-05-24 DIAGNOSIS — M21379 Foot drop, unspecified foot: Secondary | ICD-10-CM | POA: Diagnosis not present

## 2022-05-24 DIAGNOSIS — Z9884 Bariatric surgery status: Secondary | ICD-10-CM | POA: Insufficient documentation

## 2022-05-24 DIAGNOSIS — K449 Diaphragmatic hernia without obstruction or gangrene: Secondary | ICD-10-CM | POA: Insufficient documentation

## 2022-05-24 HISTORY — PX: ESOPHAGEAL BRUSHING: SHX6842

## 2022-05-24 HISTORY — PX: ESOPHAGOGASTRODUODENOSCOPY (EGD) WITH PROPOFOL: SHX5813

## 2022-05-24 LAB — KOH PREP

## 2022-05-24 SURGERY — ESOPHAGOGASTRODUODENOSCOPY (EGD) WITH PROPOFOL
Anesthesia: General

## 2022-05-24 MED ORDER — PROPOFOL 10 MG/ML IV BOLUS
INTRAVENOUS | Status: DC | PRN
  Administered 2022-05-24: 100 mg via INTRAVENOUS

## 2022-05-24 MED ORDER — LACTATED RINGERS IV SOLN: INTRAVENOUS | Status: DC

## 2022-05-24 MED ORDER — LIDOCAINE HCL 1 % IJ SOLN
INTRAMUSCULAR | Status: DC | PRN
  Administered 2022-05-24: 50 mg via INTRADERMAL

## 2022-05-24 NOTE — Discharge Instructions (Signed)
EGD Discharge instructions Please read the instructions outlined below and refer to this sheet in the next few weeks. These discharge instructions provide you with general information on caring for yourself after you leave the hospital. Your doctor may also give you specific instructions. While your treatment has been planned according to the most current medical practices available, unavoidable complications occasionally occur. If you have any problems or questions after discharge, please call your doctor. ACTIVITY You may resume your regular activity but move at a slower pace for the next 24 hours.  Take frequent rest periods for the next 24 hours.  Walking will help expel (get rid of) the air and reduce the bloated feeling in your abdomen.  No driving for 24 hours (because of the anesthesia (medicine) used during the test).  You may shower.  Do not sign any important legal documents or operate any machinery for 24 hours (because of the anesthesia used during the test).  NUTRITION Drink plenty of fluids.  You may resume your normal diet.  Begin with a light meal and progress to your normal diet.  Avoid alcoholic beverages for 24 hours or as instructed by your caregiver.  MEDICATIONS You may resume your normal medications unless your caregiver tells you otherwise.  WHAT YOU CAN EXPECT TODAY You may experience abdominal discomfort such as a feeling of fullness or "gas" pains.  FOLLOW-UP Your doctor will discuss the results of your test with you.  SEEK IMMEDIATE MEDICAL ATTENTION IF ANY OF THE FOLLOWING OCCUR: Excessive nausea (feeling sick to your stomach) and/or vomiting.  Severe abdominal pain and distention (swelling).  Trouble swallowing.  Temperature over 101 F (37.8 C).  Rectal bleeding or vomiting of blood.    Stable grade 2 esophageal varices.  Stomach lining congested-likely losing a little blood from there from time to time  Also, esophagus appeared abnormal possibly  representing a yeast infection.  Samples taken  I recommend a repeat EGD in 2 years  Further recommendations to follow once lab report is back for review  Office visit with Roseanne Kaufman in 1 month  At patient request, I called Junior Richardson Chiquito [phone] -reviewed findings and recommendations

## 2022-05-24 NOTE — H&P (Addendum)
$'@LOGO'U$ @   Primary Care Physician:  Redmond School, MD Primary Gastroenterologist:  Dr.   Pre-Procedure History & Physical: HPI:  Jerome [name]. is a 61 y.o. male here for surveillance EGD.  History esophageal varices.  Not nonselective beta-blocker candidate.  No upper GI tract symptoms.  Recent elastography.  Fatty liver.  K PA 2.8  Past Medical History:  Diagnosis Date   Anxiety    COPD (chronic obstructive pulmonary disease) (HCC)    DJD (degenerative joint disease)    Foot drop, left    H/O cardiovascular stress test 2013   H/O echocardiogram 2012   for cad, htn & obesity   History of cardiac catheterization    a. normal cors by cath in 2011   Hypertension    Spinal stenosis    Thrombocytopenia [facility])     Past Surgical History:  Procedure Laterality Date   BACK SURGERY     x 4   BIOPSY  05/27/2020   Procedure: BIOPSY;  Surgeon: Daneil Dolin, MD;  Location: [facility];  Service: Endoscopy;;   BREAST REDUCTION SURGERY     CARDIAC CATHETERIZATION  2011   5 frech sheath   CERVICAL SPINE SURGERY     COLONOSCOPY N/A 10/14/2012   IHK:[id number] rectum and colon   COLONOSCOPY WITH PROPOFOL N/A 05/27/2020   normal   CYST EXCISION Right 08/08/2016   Procedure: EXCISION OF RIGHT INGUINAL SUBCUTANEOUS MASS (LIKELY SEBACEIYS CYST);  Surgeon: Vickie Epley, MD;  Location: [facility] ORS;  Service: General;  Laterality: Right;  7cm x 4cm mass; dermabond dressing   ESOPHAGOGASTRODUODENOSCOPY (EGD) WITH PROPOFOL N/A 05/27/2020   Grade 1-2 esophageal varices, possible Barrett's s/p biopsy (negative Barretts), portal gastropathy. Gastropathy could be source of occult GI bleeding. Negative H.pylori.   FLEXIBLE SIGMOIDOSCOPY N/A 10/15/2013   markedly inflamed distal rectum with ulceration, rectal polyp benign. Acute ulceration of rectum.    GIVENS CAPSULE STUDY N/A 01/12/2021   Procedure: GIVENS CAPSULE STUDY;  Surgeon: Daneil Dolin, MD;  Location: [facility];  Service: Endoscopy;   Laterality: N/A;  8:30am   h/o cardiac cath     LAPAROSCOPIC GASTRIC SLEEVE RESECTION  06/2012   Dr Mickie [facility]ail [facility])   [place]     x2    Prior to Admission medications   Medication Sig Start Date End Date Taking? Authorizing Provider  acetaminophen (TYLENOL) 500 MG tablet Take 1,000 mg by mouth 2 (two) times daily as needed for moderate pain or headache.    Yes [provider]  bisacodyl (DULCOLAX) 5 MG EC tablet Take 10 mg by mouth at bedtime.   Yes [provider]  Calcium Carb-Cholecalciferol (CALCIUM/VITAMIN D PO) Take 1 tablet by mouth daily.   Yes [provider]  cetirizine (ZYRTEC) 10 MG tablet Take 10 mg by mouth daily as needed for allergies.   Yes [provider]  Cholecalciferol (VITAMIN D) 50 MCG (2000 UT) tablet Take 2,000 Units by mouth daily.   Yes [provider]  clonazePAM (KLONOPIN) 1 MG tablet Take 1 mg by mouth 2 (two) times daily.  04/20/14  Yes [provider]  cyclobenzaprine (FLEXERIL) 10 MG tablet Take 5 mg by mouth at bedtime as needed for muscle spasms.   Yes [provider]  diclofenac Sodium (VOLTAREN) 1 % GEL Apply 2 g topically 3 (three) times daily as needed (Pain). 02/15/22  Yes [provider]  docusate sodium (COLACE) 100 MG capsule Take 300 mg by mouth at  bedtime.   Yes [provider]  Flaxseed, Linseed, (FLAX SEED OIL) 1000 MG CAPS Take 1,000 mg by mouth daily. With Fish Oil   Yes [provider]  fluticasone (FLONASE) 50 MCG/ACT nasal spray Place 2 sprays into both nostrils daily as needed for congestion. 07/07/16  Yes [provider]  HYDROcodone-acetaminophen (NORCO/VICODIN) 5-325 MG tablet Take 1 tablet by mouth every 6 (six) hours as needed for moderate pain.   Yes [provider]  losartan (COZAAR) 25 MG tablet Take 0.5 tablets (12.5 mg total) by mouth daily. 01/02/19  Yes Strader, [place], PA-C  lubiprostone (AMITIZA) 24 MCG  capsule TAKE 1 CAPSULE BY MOUTH TWICE DAILY WITH FOOD 06/13/21  Yes Annitta Needs, NP  Multiple Vitamin (MULTIVITAMIN WITH MINERALS) TABS tablet Take 1 tablet by mouth daily.   Yes [provider]  omeprazole (PRILOSEC) 20 MG capsule Take 20 mg by mouth at bedtime. 09/23/12  Yes [provider]  pravastatin (PRAVACHOL) 40 MG tablet Take 40 mg by mouth at bedtime.   Yes [provider]  sertraline (ZOLOFT) 100 MG tablet Take 100 mg by mouth daily. 09/27/12  Yes [provider]  tamsulosin (FLOMAX) 0.4 MG CAPS capsule Take 0.4 mg by mouth daily. 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[place])  Tobacco Use   Smoking status: Every Day    Packs/day: 0.25    Years: 36.00    Total pack years: 9.00    Types: Cigarettes   Smokeless tobacco: Never  Vaping Use   Vaping Use: Never used  Substance and Sexual Activity   Alcohol use: Yes    Alcohol/week: 0.0 standard drinks of alcohol    Comment: occasionally    Drug use: No   Sexual activity: Yes    Partners: Male  Other Topics Concern   Not on file  Social History Narrative   Moving to [place] to care for brother w/ brain tumor   Lives w/ 2 daughters & 4 grandsons   Social Determinants of Health   Financial Resource Strain: Not on file  Food Insecurity: Not on file  Transportation Needs: Not on file  Physical  Activity: Not on file  Stress: Not on file  Social Connections: Not on file  Intimate Partner Violence: Not on file    Review of Systems: See HPI, otherwise negative ROS  Physical Exam: There were no vitals taken for this visit. General:   Alert,  Well-developed, well-nourished, pleasant and cooperative in NAD Skin:  Intact without significant lesions or rashes. Neck:  Supple; no masses or thyromegaly. No significant cervical adenopathy. Lungs:  Clear throughout to auscultation.   No wheezes, crackles, or rhonchi. No acute distress. Heart:  Regular rate and rhythm; no murmurs, clicks, rubs,  or gallops. Abdomen: Non-distended, normal bowel sounds.  Soft  and nontender without appreciable mass or hepatosplenomegaly.  Pulses:  Normal pulses noted. Extremities:  Without clubbing or edema.  Impression/Plan: 61 year old gentleman with a history of portal hypertension splenomegaly grade 2 esophageal varices.  Not beta-blocker candidate.  Here for surveillance EGD.  Interesting K PA low on elastography.  Wonder about presinusoidal portal hypertension (portal hypertension without cirrhosis).  Things like PSC, PBC sarcoid need to be considered in future evaluation.  Recommendations: I have offered the patient a surveillance EGD today.  Risk benefits limitations alternatives have been reviewed.  If varices have enlarged significantly I would plan to perform banding as discussed.  Questions answered all parties agreeable   Notice: This dictation was prepared with Dragon dictation along with smaller phrase technology. Any transcriptional errors that result from this process are unintentional and may not be corrected upon review.

## 2022-05-24 NOTE — Anesthesia Preprocedure Evaluation (Signed)
Anesthesia Evaluation  Patient identified by MRN, date of birth, ID band Patient awake    Reviewed: Allergy & Precautions, NPO status , Patient's Chart, lab work & pertinent test results  Airway Mallampati: II  TM Distance: >3 FB Neck ROM: Full   Comment: Cervical spine sx Dental  (+) Dental Advisory Given, Teeth Intact   Pulmonary sleep apnea and Continuous Positive Airway Pressure Ventilation , COPD, Current Smoker,    Pulmonary exam normal breath sounds clear to auscultation       Cardiovascular METS (foot drop, uses cane to walk): hypertension, Pt. on medications Normal cardiovascular exam Rhythm:Regular Rate:Normal  22-May-2022 10:01:24 [facility] ROUTINE RECORD 05-09-1961 (66 yr) Male Caucasian Vent. rate 56 BPM PR interval 156 ms QRS duration 82 ms QT/QTcB 424/409 ms P-R-T axes 84 70 76 Sinus bradycardia Possible Left atrial enlargement Borderline ECG When compared with ECG of 06-Jun-2015 09:26, Rate slower PREVIOUS ECG IS PRESENT Confirmed by Asencion Noble [phone]) on 05/22/2022 5:27:27 PM   Neuro/Psych  Headaches, Anxiety  Neuromuscular disease (foot drop) negative psych ROS   GI/Hepatic Neg liver ROS, GERD  Medicated and Controlled,LAPAROSCOPIC GASTRIC SLEEVE RESECTION   Endo/Other  negative endocrine ROS  Renal/GU negative Renal ROS  negative genitourinary   Musculoskeletal  (+) Arthritis , Osteoarthritis,    Abdominal   Peds negative pediatric ROS (+)  Hematology  (+) Blood dyscrasia (thrombocytopenia), anemia ,   Anesthesia Other Findings   Reproductive/Obstetrics negative OB ROS                             Anesthesia Physical Anesthesia Plan  ASA: 3  Anesthesia Plan: General   Post-op Pain Management: Minimal or no pain anticipated   Induction: Intravenous  PONV Risk Score and Plan: TIVA  Airway Management Planned: Nasal Cannula and Natural  Airway  Additional Equipment:   Intra-op Plan:   Post-operative Plan:   Informed Consent: I have reviewed the patients History and Physical, chart, labs and discussed the procedure including the risks, benefits and alternatives for the proposed anesthesia with the patient or authorized representative who has indicated his/her understanding and acceptance.     Dental advisory given  Plan Discussed with: CRNA and Surgeon  Anesthesia Plan Comments:         Anesthesia Quick Evaluation

## 2022-05-24 NOTE — Anesthesia Postprocedure Evaluation (Signed)
Anesthesia Post Note  Patient: Challen Spainhour.  Procedure(s) Performed: ESOPHAGOGASTRODUODENOSCOPY (EGD) WITH PROPOFOL ESOPHAGEAL BRUSHING  Patient location during evaluation: Short Stay Anesthesia Type: General Level of consciousness: awake and alert Pain management: pain level controlled Vital Signs Assessment: post-procedure vital signs reviewed and stable Respiratory status: spontaneous breathing Cardiovascular status: blood pressure returned to baseline and stable Postop Assessment: no apparent nausea or vomiting Anesthetic complications: no   No notable events documented.   Last Vitals: There were no vitals filed for this visit.  Last Pain:  Vitals:   05/24/22 0830  PainSc: 6                  Lynelle Weiler

## 2022-05-24 NOTE — Op Note (Signed)
[facility] Patient Name: Jerome [name] Procedure Date: 05/24/2022 7:59 AM MRN: [id number] Date of Birth: 1960/12/11 Attending MD: Norvel Richards , MD, [id number] CSN: [id number] Age: 61 Admit Type: Outpatient Procedure:                Upper GI endoscopy Indications:              Esophageal varices Providers:                Norvel Richards, MD, Caprice Kluver, Everardo Pacific Referring MD:              Medicines:                Propofol per Anesthesia Complications:            No immediate complications. Estimated Blood Loss:     Estimated blood loss: none. Procedure:                Pre-Anesthesia Assessment:                           - Prior to the procedure, a History and Physical                            was performed, and patient medications and                            allergies were reviewed. The patient's tolerance of                            previous anesthesia was also reviewed. The risks                            and benefits of the procedure and the sedation                            options and risks were discussed with the patient.                            All questions were answered, and informed consent                            was obtained. Prior Anticoagulants: The patient has                            taken no anticoagulant or antiplatelet agents. ASA                            Grade Assessment: III - A patient with severe                            systemic disease. After reviewing the risks and  benefits, the patient was deemed in satisfactory                            condition to undergo the procedure.                           After obtaining informed consent, the endoscope was                            passed under direct vision. Throughout the                            procedure, the patient's blood pressure, pulse, and                            oxygen saturations were  monitored continuously. The                            [id number]-H190 ([id number]) scope was introduced through the                            mouth, and advanced to the second part of duodenum.                            The upper GI endoscopy was accomplished without                            difficulty. The patient tolerated the procedure                            well. Scope In: 8:36:15 AM Scope Out: 8:42:20 AM Total Procedure Duration: 0 hours 6 minutes 5 seconds  Findings:      To mid esophageal columns grade 2 esophageal varices. No bleeding       stigmata. Prominent esophageal plaques suspicious for Candida       esophagitis. Gastric cavity empty. Small hiatal hernia. Prominent       changes of severe portal hypertensive gastropathy particularly proximal       stomach. Scattered submucosal hemorrhage. Touch friability. 2 isolated       areas of gastroesophageal varices in the fundus/cardia seen on       retroflexion. Pylorus patent. Examination of her second portion of       duodenum appeared normal. Esophageal mucosa brush for KOH prep. Impression:               - Innocent appearing grade 2 esophageal varices.                            Esophageal plaques suspicious for Candida                            esophagitis?"status post KOH brushing. Severe portal                            hypertensive gastropathy with active oozing?"likely  contributing factor to anemia                           -Isolated gastroesophageal varices proximal stomach                            as described                           -Normal D1 and D2 Moderate Sedation:      Moderate (conscious) sedation was personally administered by an       anesthesia professional. The following parameters were monitored: oxygen       saturation, heart rate, blood pressure, respiratory rate, EKG, adequacy       of pulmonary ventilation, and response to care. Recommendation:           - Patient has  a contact number available for                            emergencies. The signs and symptoms of potential                            delayed complications were discussed with the                            patient. Return to normal activities tomorrow.                            Written discharge instructions were provided to the                            patient.                           - Advance diet as tolerated today.                           -Follow-up on KOH. Repeat EGD in 2 years.                            Elastography recently normal with a K PA at 2.8.                            This is highly discordant with clinical picture.                            Need to consider presinusoidal causes of portal                            hypertension (portal hypertension without                            cirrhosis), i.e. venoocclusive disease, sarcoid,                            PSC, PBC., etc.                           -  Office visit with [name] in 1 month Procedure Code(s):        --- Professional ---                           [phone], Esophagogastroduodenoscopy, flexible,                            transoral; diagnostic, including collection of                            specimen(s) by brushing or washing, when performed                            (separate procedure) Diagnosis Code(s):        --- Professional ---                           I85.00, Esophageal varices without bleeding CPT copyright 2022 American Medical Association. All rights reserved. The codes documented in this report are preliminary and upon coder review may  be revised to meet current compliance requirements. [name]. Loneta Tamplin, MD Norvel Richards, MD 05/24/2022 9:00:51 AM This report has been signed electronically. Number of Addenda: 0

## 2022-05-24 NOTE — Transfer of Care (Addendum)
Immediate Anesthesia Transfer of Care Note  Patient: Jerome [name].  Procedure(s) Performed: ESOPHAGOGASTRODUODENOSCOPY (EGD) WITH PROPOFOL ESOPHAGEAL BRUSHING  Patient Location: Short Stay  Anesthesia Type:General  Level of Consciousness: awake  Airway & Oxygen Therapy: Patient Spontanous Breathing  Post-op Assessment: Report given to RN  Post vital signs: Reviewed and stable  Last Vitals:  Vitals Value Taken Time  BP 94/62   Temp 36.5   Pulse 67   Resp 17   SpO2 95     Last Pain:  Vitals:   05/24/22 0830  PainSc: 6          Complications: No notable events documented.

## 2022-05-25 DIAGNOSIS — G4733 Obstructive sleep apnea (adult) (pediatric): Secondary | ICD-10-CM | POA: Diagnosis not present

## 2022-05-26 ENCOUNTER — Other Ambulatory Visit: Payer: Self-pay

## 2022-05-26 MED ORDER — FLUCONAZOLE 100 MG PO TABS: ORAL_TABLET | ORAL | 0 refills | Status: DC

## 2022-05-30 ENCOUNTER — Ambulatory Visit (HOSPITAL_COMMUNITY)
Admission: RE | Admit: 2022-05-30 | Discharge: 2022-05-30 | Disposition: A | Discharge status: Home / Self Care | Payer: Medicare HMO | Source: Ambulatory Visit | Attending: Acute Care | Admitting: Acute Care

## 2022-05-30 DIAGNOSIS — F17210 Nicotine dependence, cigarettes, uncomplicated: Secondary | ICD-10-CM | POA: Insufficient documentation

## 2022-05-30 DIAGNOSIS — I700 Atherosclerosis of aorta: Secondary | ICD-10-CM | POA: Diagnosis not present

## 2022-05-30 DIAGNOSIS — F17200 Nicotine dependence, unspecified, uncomplicated: Secondary | ICD-10-CM

## 2022-05-30 DIAGNOSIS — Z122 Encounter for screening for malignant neoplasm of respiratory organs: Secondary | ICD-10-CM | POA: Insufficient documentation

## 2022-05-30 DIAGNOSIS — M79672 Pain in left foot: Secondary | ICD-10-CM | POA: Diagnosis not present

## 2022-05-30 DIAGNOSIS — R69 Illness, unspecified: Secondary | ICD-10-CM | POA: Diagnosis not present

## 2022-05-30 DIAGNOSIS — M79675 Pain in left toe(s): Secondary | ICD-10-CM | POA: Diagnosis not present

## 2022-05-30 DIAGNOSIS — J439 Emphysema, unspecified: Secondary | ICD-10-CM | POA: Diagnosis not present

## 2022-05-30 DIAGNOSIS — M778 Other enthesopathies, not elsewhere classified: Secondary | ICD-10-CM | POA: Diagnosis not present

## 2022-05-30 DIAGNOSIS — L110 Acquired keratosis follicularis: Secondary | ICD-10-CM | POA: Diagnosis not present

## 2022-05-31 ENCOUNTER — Encounter (HOSPITAL_COMMUNITY): Payer: Self-pay | Admitting: Internal Medicine

## 2022-06-01 ENCOUNTER — Other Ambulatory Visit: Payer: Self-pay

## 2022-06-01 DIAGNOSIS — Z122 Encounter for screening for malignant neoplasm of respiratory organs: Secondary | ICD-10-CM

## 2022-06-01 DIAGNOSIS — Z87891 Personal history of nicotine dependence: Secondary | ICD-10-CM

## 2022-06-01 DIAGNOSIS — F17210 Nicotine dependence, cigarettes, uncomplicated: Secondary | ICD-10-CM

## 2022-06-14 DIAGNOSIS — G5622 Lesion of ulnar nerve, left upper limb: Secondary | ICD-10-CM | POA: Diagnosis not present

## 2022-06-14 DIAGNOSIS — M7702 Medial epicondylitis, left elbow: Secondary | ICD-10-CM | POA: Diagnosis not present

## 2022-06-23 DIAGNOSIS — Z1331 Encounter for screening for depression: Secondary | ICD-10-CM | POA: Diagnosis not present

## 2022-06-23 DIAGNOSIS — D518 Other vitamin B12 deficiency anemias: Secondary | ICD-10-CM | POA: Diagnosis not present

## 2022-06-23 DIAGNOSIS — Z6825 Body mass index (BMI) 25.0-25.9, adult: Secondary | ICD-10-CM | POA: Diagnosis not present

## 2022-06-23 DIAGNOSIS — M48062 Spinal stenosis, lumbar region with neurogenic claudication: Secondary | ICD-10-CM | POA: Diagnosis not present

## 2022-06-23 DIAGNOSIS — E663 Overweight: Secondary | ICD-10-CM | POA: Diagnosis not present

## 2022-06-23 DIAGNOSIS — M5412 Radiculopathy, cervical region: Secondary | ICD-10-CM | POA: Diagnosis not present

## 2022-06-23 DIAGNOSIS — E039 Hypothyroidism, unspecified: Secondary | ICD-10-CM | POA: Diagnosis not present

## 2022-06-23 DIAGNOSIS — M519 Unspecified thoracic, thoracolumbar and lumbosacral intervertebral disc disorder: Secondary | ICD-10-CM | POA: Diagnosis not present

## 2022-06-23 DIAGNOSIS — E559 Vitamin D deficiency, unspecified: Secondary | ICD-10-CM | POA: Diagnosis not present

## 2022-06-23 DIAGNOSIS — Z0000 Encounter for general adult medical examination without abnormal findings: Secondary | ICD-10-CM | POA: Diagnosis not present

## 2022-06-23 DIAGNOSIS — Z125 Encounter for screening for malignant neoplasm of prostate: Secondary | ICD-10-CM | POA: Diagnosis not present

## 2022-06-23 DIAGNOSIS — G894 Chronic pain syndrome: Secondary | ICD-10-CM | POA: Diagnosis not present

## 2022-06-24 DIAGNOSIS — G4733 Obstructive sleep apnea (adult) (pediatric): Secondary | ICD-10-CM | POA: Diagnosis not present

## 2022-06-26 DIAGNOSIS — F331 Major depressive disorder, recurrent, moderate: Secondary | ICD-10-CM | POA: Diagnosis not present

## 2022-06-26 DIAGNOSIS — R69 Illness, unspecified: Secondary | ICD-10-CM | POA: Diagnosis not present

## 2022-06-26 DIAGNOSIS — D696 Thrombocytopenia, unspecified: Secondary | ICD-10-CM | POA: Diagnosis not present

## 2022-06-26 DIAGNOSIS — M5412 Radiculopathy, cervical region: Secondary | ICD-10-CM | POA: Diagnosis not present

## 2022-06-26 DIAGNOSIS — Z5181 Encounter for therapeutic drug level monitoring: Secondary | ICD-10-CM | POA: Diagnosis not present

## 2022-06-26 DIAGNOSIS — G894 Chronic pain syndrome: Secondary | ICD-10-CM | POA: Diagnosis not present

## 2022-06-26 DIAGNOSIS — M47812 Spondylosis without myelopathy or radiculopathy, cervical region: Secondary | ICD-10-CM | POA: Diagnosis not present

## 2022-06-26 DIAGNOSIS — M5416 Radiculopathy, lumbar region: Secondary | ICD-10-CM | POA: Diagnosis not present

## 2022-06-29 ENCOUNTER — Encounter: Payer: Self-pay | Admitting: *Deleted

## 2022-06-29 ENCOUNTER — Ambulatory Visit (INDEPENDENT_AMBULATORY_CARE_PROVIDER_SITE_OTHER): Payer: Medicare HMO | Admitting: Gastroenterology

## 2022-06-29 ENCOUNTER — Encounter: Payer: Self-pay | Admitting: Gastroenterology

## 2022-06-29 ENCOUNTER — Other Ambulatory Visit: Payer: Self-pay | Admitting: Gastroenterology

## 2022-06-29 VITALS — BP 122/79 | HR 55 | Temp 97.5°F | Ht 72.0 in | Wt 187.0 lb

## 2022-06-29 DIAGNOSIS — K766 Portal hypertension: Secondary | ICD-10-CM | POA: Diagnosis not present

## 2022-06-29 DIAGNOSIS — K3189 Other diseases of stomach and duodenum: Secondary | ICD-10-CM | POA: Diagnosis not present

## 2022-06-29 DIAGNOSIS — D696 Thrombocytopenia, unspecified: Secondary | ICD-10-CM | POA: Diagnosis not present

## 2022-06-29 MED ORDER — LUBIPROSTONE 24 MCG PO CAPS: ORAL_CAPSULE | ORAL | 5 refills | Status: DC

## 2022-06-29 NOTE — Progress Notes (Signed)
Gastroenterology Office Note     Primary Care Physician:  Redmond School, MD  Primary Gastroenterologist: Dr. Gala Romney   Chief Complaint   Chief Complaint  Patient presents with   Follow-up    Patient here today for a follow up on Constipation and recent, and Recent Egd on 05/24/2022. He says the constipation is not any better. He says he will need amitza 24 mcg refilled.     History of Present Illness   Jerome [name]. is a 61 y.o. male presenting today in follow-up with a history of chronic OIC, severe spinal stenosis, IDA in 2021, fatty liver with splenomegaly and changes of portal hypertension on EGD in 2021, returning for routine follow-up.   Elastography recently normal with kPa at 2.8. Need to consider other causes of portal hypertension without cirrhosis. (Veno-occlusive disease, sarcoid, PSC, PBC) EGD recently with Grade 2 esophageal varices, +candida esophagitis, severe portal gastropathy with active oozing, isolated GE varices proximal stomach, normal duodenum. Discordant results with EGD findings and elastograpy.   Portal vein patent on doppler in March 2023.   Taking Amitiza 24 mcg daily. Stretching it out. Believes Movantik was not good due to feeling like pain meds were less effective. Does not want to try a different regimen.   Taking PPI every other day. No dysphagia. No odynophagia. Completed course of Diflucan for candida esophagitis     Past Medical History:  Diagnosis Date   Anxiety    COPD (chronic obstructive pulmonary disease) (HCC)    DJD (degenerative joint disease)    Foot drop, left    H/O cardiovascular stress test 2013   H/O echocardiogram 2012   for cad, htn & obesity   History of cardiac catheterization    a. normal cors by cath in 2011   Hypertension    Spinal stenosis    Thrombocytopenia ([place])     Past Surgical History:  Procedure Laterality Date   BACK SURGERY     x 4   BIOPSY  05/27/2020   Procedure: BIOPSY;  Surgeon:  Daneil Dolin, MD;  Location: [facility];  Service: Endoscopy;;   BREAST REDUCTION SURGERY     CARDIAC CATHETERIZATION  2011   5 frech sheath   CERVICAL SPINE SURGERY     COLONOSCOPY N/A 10/14/2012   [id number] rectum and colon   COLONOSCOPY WITH PROPOFOL N/A 05/27/2020   normal   CYST EXCISION Right 08/08/2016   Procedure: EXCISION OF RIGHT INGUINAL SUBCUTANEOUS MASS (LIKELY SEBACEIYS CYST);  Surgeon: Vickie Epley, MD;  Location: [facility];  Service: General;  Laterality: Right;  7cm x 4cm mass; dermabond dressing   ESOPHAGEAL BRUSHING  05/24/2022   Procedure: ESOPHAGEAL BRUSHING;  Surgeon: Daneil Dolin, MD;  Location: [facility];  Service: Endoscopy;;   ESOPHAGOGASTRODUODENOSCOPY (EGD) WITH PROPOFOL N/A 05/27/2020   Grade 1-2 esophageal varices, possible Barrett's s/p biopsy (negative Barretts), portal gastropathy. Gastropathy could be source of occult GI bleeding. Negative H.pylori.   ESOPHAGOGASTRODUODENOSCOPY (EGD) WITH PROPOFOL N/A 05/24/2022   Procedure: ESOPHAGOGASTRODUODENOSCOPY (EGD) WITH PROPOFOL;  Surgeon: Daneil Dolin, MD;  Location: [facility];  Service: Endoscopy;  Laterality: N/A;  9:00am, asa 3   FLEXIBLE SIGMOIDOSCOPY N/A 10/15/2013   markedly inflamed distal rectum with ulceration, rectal polyp benign. Acute ulceration of rectum.    GIVENS CAPSULE STUDY N/A 01/12/2021   Procedure: GIVENS CAPSULE STUDY;  Surgeon: Daneil Dolin, MD;  Location: [facility];  Service: Endoscopy;  Laterality: N/A;  8:30am   h/o cardiac cath     LAPAROSCOPIC GASTRIC SLEEVE RESECTION  06/2012   Dr Mickie Bail [facility])   [place]     x2    Current Outpatient Medications  Medication Sig Dispense Refill   acetaminophen (TYLENOL) 500 MG tablet Take 1,000 mg by mouth 2 (two) times daily as needed for moderate pain or headache.      bisacodyl (DULCOLAX) 5 MG EC tablet Take 10 mg by mouth at bedtime.     Calcium Carb-Cholecalciferol (CALCIUM/VITAMIN D PO) Take 1 tablet  by mouth daily.     cetirizine (ZYRTEC) 10 MG tablet Take 10 mg by mouth daily as needed for allergies.     Cholecalciferol (VITAMIN D) 50 MCG (2000 UT) tablet Take 2,000 Units by mouth daily.     clonazePAM (KLONOPIN) 1 MG tablet Take 1 mg by mouth 2 (two) times daily.      cyclobenzaprine (FLEXERIL) 10 MG tablet Take 5 mg by mouth at bedtime as needed for muscle spasms.     diclofenac Sodium (VOLTAREN) 1 % GEL Apply 2 g topically 3 (three) times daily as needed (Pain).     docusate sodium (COLACE) 100 MG capsule Take 300 mg by mouth at bedtime.     Flaxseed, Linseed, (FLAX SEED OIL) 1000 MG CAPS Take 1,000 mg by mouth daily. With Fish Oil     HYDROcodone-acetaminophen (NORCO/VICODIN) 5-325 MG tablet Take 1 tablet by mouth every 6 (six) hours as needed for moderate pain.     losartan (COZAAR) 25 MG tablet Take 0.5 tablets (12.5 mg total) by mouth daily. 45 tablet 3   lubiprostone (AMITIZA) 24 MCG capsule TAKE 1 CAPSULE BY MOUTH TWICE DAILY WITH FOOD 60 capsule 5   Multiple Vitamin (MULTIVITAMIN WITH MINERALS) TABS tablet Take 1 tablet by mouth daily.     omeprazole (PRILOSEC) 20 MG capsule Take 20 mg by mouth at bedtime.     pravastatin (PRAVACHOL) 40 MG tablet Take 40 mg by mouth at bedtime.     sertraline (ZOLOFT) 100 MG tablet Take 100 mg by mouth daily.     tamsulosin (FLOMAX) 0.4 MG CAPS capsule Take 0.4 mg by mouth daily.     No current facility-administered medications for this visit.    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[place])  Tobacco Use   Smoking status: Every Day    Packs/day: 0.25    Years: 36.00    Total pack years: 9.00    Types: Cigarettes   Smokeless tobacco: Never  Vaping Use   Vaping Use: Never used  Substance and Sexual Activity   Alcohol use: Yes    Alcohol/week: 0.0 standard drinks of alcohol    Comment: occasionally    Drug use: No   Sexual activity: Yes    Partners: Male  Other Topics Concern   Not  on file  Social History Narrative   Moving to [place] to care for brother w/ brain tumor   Lives w/ 2 daughters & 4 grandsons   Social Determinants of Health   Financial Resource Strain: Not on file  Food Insecurity: Not on file  Transportation Needs: Not on file  Physical Activity: Not on file  Stress: Not on file  Social Connections: Not on file  Intimate Partner Violence: Not on file     Review of Systems   Gen: Denies any fever, chills, fatigue, weight loss, lack of appetite.  CV: Denies chest pain, heart palpitations, peripheral edema, syncope.  Resp: Denies shortness of breath at rest or with exertion. Denies wheezing or cough.  GI: Denies dysphagia or odynophagia. Denies jaundice, hematemesis, fecal incontinence. GU : Denies urinary burning, urinary frequency, urinary hesitancy MS: Denies joint pain, muscle weakness, cramps, or limitation of movement.  Derm: Denies rash, itching, dry skin Psych: Denies depression, anxiety, memory loss, and confusion Heme: Denies bruising, bleeding, and enlarged lymph nodes.   Physical Exam   BP 122/79 (BP Location: Left Arm, Patient Position: Sitting, Cuff Size: Large)   Pulse (!) 55   Temp (!) 97.5 F (36.4 C) (Temporal)   Ht 6' (1.829 m)   Wt 187 lb (84.8 kg)   BMI 25.36 kg/m  General:   Alert and oriented. Pleasant and cooperative. Well-nourished and well-developed.  Head:  Normocephalic and atraumatic. Eyes:  Without  icterus Abdomen:  +BS, soft, non-tender and non-distended. No HSM noted. No guarding or rebound. No masses appreciated.  Rectal:  Deferred  Extremities:  Without edema. Neurologic:  Alert and  oriented x4;  grossly normal neurologically. Skin:  Intact without significant lesions or rashes. Psych:  Alert and cooperative. Normal mood and affect.    Assessment   Jerome [name]. is a 61 y.o. male presenting today in follow-up with a history of chronic OIC, severe spinal stenosis, IDA in 2021, fatty liver with splenomegaly and changes of portal hypertension on EGD in 2021, returning for routine follow-up.  Portal gastropathy and varices: interesting scenario as elastography is normal. Splenomegaly noted. Need to evaluate for other causes of portal hypertension without cirrhosis.   OIC: Continue Amitiza 24 mcg daily. Does not want to change regimen currently.  GERD: PPI every other day controlling symptoms.     PLAN    Doppler US Check ANA, AMA, Immunoglobulins, IgG 4, ACE level May need MRI Continue Amitiza Return in 6 months Further recommendations to follow    Annitta Needs, PhD, ANP-BC [facility]

## 2022-06-29 NOTE — Patient Instructions (Signed)
Please have blood work completed when you are able.  I have also ordered a specialized ultrasound of blood vessels in the liver.   You may need more imaging after that.  I have refilled Amitiza.  We will see you in 6 months!  Always good to see you! I hope you have a wonderful holiday season.  Annitta Needs, PhD, ANP-BC [facility]

## 2022-07-03 ENCOUNTER — Encounter: Payer: Self-pay | Admitting: Internal Medicine

## 2022-07-03 ENCOUNTER — Ambulatory Visit: Payer: Medicare HMO | Attending: Internal Medicine | Admitting: Internal Medicine

## 2022-07-03 VITALS — BP 130/70 | HR 68 | Ht 72.0 in | Wt 183.2 lb

## 2022-07-03 DIAGNOSIS — R002 Palpitations: Secondary | ICD-10-CM | POA: Insufficient documentation

## 2022-07-03 DIAGNOSIS — I10 Essential (primary) hypertension: Secondary | ICD-10-CM

## 2022-07-03 DIAGNOSIS — R42 Dizziness and giddiness: Secondary | ICD-10-CM | POA: Diagnosis not present

## 2022-07-03 DIAGNOSIS — R079 Chest pain, unspecified: Secondary | ICD-10-CM | POA: Diagnosis not present

## 2022-07-03 DIAGNOSIS — Z720 Tobacco use: Secondary | ICD-10-CM | POA: Insufficient documentation

## 2022-07-03 NOTE — Patient Instructions (Addendum)

## 2022-07-03 NOTE — Progress Notes (Signed)
Cardiology Office Note  Date: 07/03/2022   ID: Jerome [name]., DOB [birth year], MRN [id number]  PCP:  Redmond School, MD  Cardiologist:  None Electrophysiologist:  None   Reason for Office Visit: Follow-up of palpitations and dizziness   History of Present Illness: Jerome [name]. is a 61 y.o. male known to have HTN, OSA on CPAP, left lower extremity varicose veins s/p GSV ablation and stab phlebectomies presented to the cardiology clinic for follow-up visit.  Patient was initially referred to cardiology clinic for evaluation of palpitations and dizziness. Echo was performed in 2020/22 at that time which showed normal LVEF and no valvular abnormalities. He was noted to consume increased caffeine which he was told to cut down and reevaluate his palpitations. He was instructed to take more p.o. intake of fluids for dizziness treatment. He also underwent LHC in 2013 which showed angiography normal coronaries. He presented today for follow-up visit.  He reported having chest pains lasting for a few seconds and associated that from his anxiety levels. Denied any DOE, syncope and LE swelling.  Has occasional palpitations but has dizziness after starting tamsulosin.  He also reported having dizziness in the past.  He underwent EGD 05/2022 which showed erosive gastropathy is likely secondary to portal hypertension and esophageal varices.  He cut on cigarettes from 2 packs/day to 3 cigarettes/day currently.  Denied alcohol use and illicit drug abuse.   Past Medical History:  Diagnosis Date   Anxiety    COPD (chronic obstructive pulmonary disease) (HCC)    DJD (degenerative joint disease)    Foot drop, left    H/O cardiovascular stress test 2013   H/O echocardiogram 2012   for cad, htn & obesity   History of cardiac catheterization    a. normal cors by cath in 2011   Hypertension    Spinal stenosis    Thrombocytopenia [facility])     Past Surgical History:  Procedure Laterality Date    BACK SURGERY     x 4   BIOPSY  05/27/2020   Procedure: BIOPSY;  Surgeon: Daneil Dolin, MD;  [facility];  Service: Endoscopy;;   BREAST REDUCTION SURGERY     CARDIAC CATHETERIZATION  2011   5 frech sheath   CERVICAL SPINE SURGERY     COLONOSCOPY N/A 10/14/2012   [id number] rectum and colon   COLONOSCOPY WITH PROPOFOL N/A 05/27/2020   normal   CYST EXCISION Right 08/08/2016   Procedure: EXCISION OF RIGHT INGUINAL SUBCUTANEOUS MASS (LIKELY SEBACEIYS CYST);  Surgeon: Vickie Epley, MD;  Location: [facility];  Service: General;  Laterality: Right;  7cm x 4cm mass; dermabond dressing   ESOPHAGEAL BRUSHING  05/24/2022   Procedure: ESOPHAGEAL BRUSHING;  Surgeon: Daneil Dolin, MD;  [facility];  Service: Endoscopy;;   ESOPHAGOGASTRODUODENOSCOPY (EGD) WITH PROPOFOL N/A 05/27/2020   Grade 1-2 esophageal varices, possible Barrett's s/p biopsy (negative Barretts), portal gastropathy. Gastropathy could be source of occult GI bleeding. Negative H.pylori.   ESOPHAGOGASTRODUODENOSCOPY (EGD) WITH PROPOFOL N/A 05/24/2022   Procedure: ESOPHAGOGASTRODUODENOSCOPY (EGD) WITH PROPOFOL;  Surgeon: Daneil Dolin, MD;  [facility];  Service: Endoscopy;  Laterality: N/A;  9:00am, asa 3   FLEXIBLE SIGMOIDOSCOPY N/A 10/15/2013   markedly inflamed distal rectum with ulceration, rectal polyp benign. Acute ulceration of rectum.    GIVENS CAPSULE STUDY N/A 01/12/2021   Procedure: GIVENS CAPSULE STUDY;  Surgeon: Daneil Dolin, MD;  [facility];  Service: Endoscopy;  Laterality: N/A;  8:30am   h/o cardiac cath     LAPAROSCOPIC GASTRIC SLEEVE RESECTION  06/2012   Dr Mickie Bail [facility])   [place]     x2    Current Outpatient Medications  Medication Sig Dispense Refill   acetaminophen (TYLENOL) 500 MG tablet Take 1,000 mg by mouth 2 (two) times daily as needed for moderate pain or headache.      bisacodyl (DULCOLAX) 5 MG EC tablet Take 10 mg by mouth at  bedtime.     Calcium Carb-Cholecalciferol (CALCIUM/VITAMIN D PO) Take 1 tablet by mouth daily.     cetirizine (ZYRTEC) 10 MG tablet Take 10 mg by mouth daily as needed for allergies.     Cholecalciferol (VITAMIN D) 50 MCG (2000 UT) tablet Take 2,000 Units by mouth daily.     clonazePAM (KLONOPIN) 1 MG tablet Take 1 mg by mouth 2 (two) times daily.      cyclobenzaprine (FLEXERIL) 10 MG tablet Take 5 mg by mouth at bedtime as needed for muscle spasms.     diclofenac Sodium (VOLTAREN) 1 % GEL Apply 2 g topically 3 (three) times daily as needed (Pain).     docusate sodium (COLACE) 100 MG capsule Take 300 mg by mouth at bedtime.     Flaxseed, Linseed, (FLAX SEED OIL) 1000 MG CAPS Take 1,000 mg by mouth daily. With Fish Oil     HYDROcodone-acetaminophen (NORCO/VICODIN) 5-325 MG tablet Take 1 tablet by mouth every 6 (six) hours as needed for moderate pain.     losartan (COZAAR) 25 MG tablet Take 0.5 tablets (12.5 mg total) by mouth daily. 45 tablet 3   lubiprostone (AMITIZA) 24 MCG capsule TAKE 1 CAPSULE BY MOUTH TWICE DAILY WITH FOOD 60 capsule 5   Multiple Vitamin (MULTIVITAMIN WITH MINERALS) TABS tablet Take 1 tablet by mouth daily.     omeprazole (PRILOSEC) 20 MG capsule Take 20 mg by mouth at bedtime.     pravastatin (PRAVACHOL) 40 MG tablet Take 40 mg by mouth at bedtime.     sertraline (ZOLOFT) 100 MG tablet Take 100 mg by mouth daily.     tamsulosin (FLOMAX) 0.4 MG CAPS capsule Take 0.4 mg by mouth daily.     No current facility-administered medications for this visit.   Allergies:  Aspirin, Codeine, and Nsaids   Social History: The patient  reports that he has been smoking cigarettes. He has a 9.00 pack-year smoking history. He has never used smokeless tobacco. He reports current alcohol use. He reports that he does not use drugs.   Family History: The patient's family history includes Brain cancer (age of onset: 73) in his brother; Colon cancer in his paternal grandmother; Heart attack in  his father.   ROS:  Please see the history of present illness. Otherwise, complete review of systems is positive for none.  All other systems are reviewed and negative.   Physical Exam: VS:  There were no vitals taken for this visit., BMI There is no height or weight on file to calculate BMI.  Wt Readings from Last 3 Encounters:  06/29/22 187 lb (84.8 kg)  05/22/22 182 lb 5.1 oz (82.7 kg)  03/28/22 182 lb 6.4 oz (82.7 kg)    General: Patient appears comfortable at rest. HEENT: Conjunctiva and lids normal, oropharynx clear with moist mucosa. Neck: Supple, no elevated JVP or carotid bruits, no thyromegaly. Lungs: Clear to auscultation, nonlabored breathing at rest. Cardiac: Regular rate and rhythm, no S3 or significant systolic murmur, no  pericardial rub. Abdomen: Soft, nontender, no hepatomegaly, bowel sounds present, no guarding or rebound. Extremities: No pitting edema, distal pulses 2+. Skin: Warm and dry. Musculoskeletal: No kyphosis. Neuropsychiatric: Alert and oriented x3,   ECG:  NSR and no ST-T changes  Recent Labwork: 05/22/2022: Hemoglobin 15.1; Platelets 125  No results found for: "CHOL", "TRIG", "HDL", "CHOLHDL", "VLDL", "LDLCALC", "LDLDIRECT"  Other Studies Reviewed Today: Echo from 2020 and 2022 LVEF preserved No valve abnormalities  LHC in 2013 Angiography normal coronaries Normal LVEDP  Assessment and Plan: Patient is a 61 year old M known to have HTN, OSA on CPAP, left lower extremity varicose veins s/p GSV ablation and stab phlebectomies presented to the cardiology clinic for follow-up visit.  # Palpitations and dizziness -Patient had palpitations and dizziness in the past which might be secondary to anemia.  Currently he reports occasional palpitations and dizziness after starting to take tamsulosin.  # Chest pain -Patient had chest pain lasting for few seconds during his peak anxiety. He underwent LHC in 2013 which showed angiography normal coronaries  and normal LVEDP.  He subsequently underwent NM stress test in 2016 which showed no evidence of ischemia.  If his chest pains become more frequent or longer in duration, he will require NM stress test.  # HTN, controlled -Continue losartan 12.5 mg once daily  # OSA on CPAP -Congratulated and encouraged to be compliant with CPAP therapy  # Nicotine abuse -Smoking cessation instruction/counseling given:  counseled patient on the dangers of tobacco use, advised patient to stop smoking, and reviewed strategies to maximize success   I have spent a total of 33 minutes with patient reviewing chart, EKGs, labs and examining patient as well as establishing an assessment and plan that was discussed with the patient.  > 50% of time was spent in direct patient care.     Medication Adjustments/Labs and Tests Ordered: Current medicines are reviewed at length with the patient today.  Concerns regarding medicines are outlined above.   Tests Ordered: No orders of the defined types were placed in this encounter.   Medication Changes: No orders of the defined types were placed in this encounter.   Disposition:  Follow up  one year  Signed Eliot Bencivenga Fidel Levy, MD, 07/03/2022 1:05 PM    [facility] [place], [place], [place] [id number]

## 2022-07-05 ENCOUNTER — Ambulatory Visit (HOSPITAL_COMMUNITY): Payer: Medicare HMO

## 2022-07-06 ENCOUNTER — Ambulatory Visit (HOSPITAL_COMMUNITY): Payer: Medicare HMO | Attending: Gastroenterology

## 2022-07-06 DIAGNOSIS — E611 Iron deficiency: Secondary | ICD-10-CM | POA: Diagnosis not present

## 2022-07-06 DIAGNOSIS — H9312 Tinnitus, left ear: Secondary | ICD-10-CM | POA: Diagnosis not present

## 2022-07-06 DIAGNOSIS — D696 Thrombocytopenia, unspecified: Secondary | ICD-10-CM | POA: Diagnosis not present

## 2022-07-06 DIAGNOSIS — H9042 Sensorineural hearing loss, unilateral, left ear, with unrestricted hearing on the contralateral side: Secondary | ICD-10-CM | POA: Diagnosis not present

## 2022-07-11 DIAGNOSIS — K766 Portal hypertension: Secondary | ICD-10-CM | POA: Diagnosis not present

## 2022-07-11 DIAGNOSIS — K3189 Other diseases of stomach and duodenum: Secondary | ICD-10-CM | POA: Diagnosis not present

## 2022-07-13 LAB — ANGIOTENSIN CONVERTING ENZYME: Angio Convert Enzyme: 27 U/L (ref 14–82)

## 2022-07-13 LAB — IGG 4: IgG, Subclass 4: 6 mg/dL (ref 2–96)

## 2022-07-13 LAB — ANA: Anti Nuclear Antibody (ANA): NEGATIVE

## 2022-07-13 LAB — MITOCHONDRIAL ANTIBODIES: Mitochondrial Ab: <20 Units (ref 0.0–20.0)

## 2022-07-13 LAB — IGG, IGA, IGM
IgA/Immunoglobulin A, Serum: 147 mg/dL (ref 90–386)
IgG (Immunoglobin G), Serum: 578 mg/dL — ABNORMAL LOW (ref 603–1613)
IgM (Immunoglobulin M), Srm: 27 mg/dL (ref 20–172)

## 2022-07-25 DIAGNOSIS — G4733 Obstructive sleep apnea (adult) (pediatric): Secondary | ICD-10-CM | POA: Diagnosis not present

## 2022-08-01 DIAGNOSIS — S93331D Other subluxation of right foot, subsequent encounter: Secondary | ICD-10-CM | POA: Diagnosis not present

## 2022-08-01 DIAGNOSIS — M79674 Pain in right toe(s): Secondary | ICD-10-CM | POA: Diagnosis not present

## 2022-08-01 DIAGNOSIS — M779 Enthesopathy, unspecified: Secondary | ICD-10-CM | POA: Diagnosis not present

## 2022-08-01 DIAGNOSIS — M79671 Pain in right foot: Secondary | ICD-10-CM | POA: Diagnosis not present

## 2022-08-03 DIAGNOSIS — G5622 Lesion of ulnar nerve, left upper limb: Secondary | ICD-10-CM | POA: Diagnosis not present

## 2022-08-03 DIAGNOSIS — M7702 Medial epicondylitis, left elbow: Secondary | ICD-10-CM | POA: Diagnosis not present

## 2022-08-11 DIAGNOSIS — E611 Iron deficiency: Secondary | ICD-10-CM | POA: Diagnosis not present

## 2022-08-15 DIAGNOSIS — G9589 Other specified diseases of spinal cord: Secondary | ICD-10-CM | POA: Diagnosis not present

## 2022-08-15 DIAGNOSIS — D696 Thrombocytopenia, unspecified: Secondary | ICD-10-CM | POA: Diagnosis not present

## 2022-08-15 DIAGNOSIS — U071 COVID-19: Secondary | ICD-10-CM | POA: Diagnosis not present

## 2022-08-15 DIAGNOSIS — K766 Portal hypertension: Secondary | ICD-10-CM | POA: Diagnosis not present

## 2022-08-16 ENCOUNTER — Ambulatory Visit (HOSPITAL_COMMUNITY)
Admission: RE | Admit: 2022-08-16 | Discharge: 2022-08-16 | Disposition: A | Discharge status: Home / Self Care | Payer: Medicare HMO | Source: Ambulatory Visit | Attending: Gastroenterology | Admitting: Gastroenterology

## 2022-08-16 DIAGNOSIS — K8020 Calculus of gallbladder without cholecystitis without obstruction: Secondary | ICD-10-CM | POA: Diagnosis not present

## 2022-08-16 DIAGNOSIS — K766 Portal hypertension: Secondary | ICD-10-CM | POA: Insufficient documentation

## 2022-08-16 DIAGNOSIS — K3189 Other diseases of stomach and duodenum: Secondary | ICD-10-CM | POA: Insufficient documentation

## 2022-08-16 DIAGNOSIS — I10 Essential (primary) hypertension: Secondary | ICD-10-CM | POA: Diagnosis not present

## 2022-08-24 ENCOUNTER — Encounter: Payer: Self-pay | Admitting: Gastroenterology

## 2022-08-30 DIAGNOSIS — R69 Illness, unspecified: Secondary | ICD-10-CM | POA: Diagnosis not present

## 2022-08-30 DIAGNOSIS — F331 Major depressive disorder, recurrent, moderate: Secondary | ICD-10-CM | POA: Diagnosis not present

## 2022-08-31 DIAGNOSIS — M5010 Cervical disc disorder with radiculopathy, unspecified cervical region: Secondary | ICD-10-CM | POA: Diagnosis not present

## 2022-09-02 ENCOUNTER — Other Ambulatory Visit: Payer: Self-pay | Admitting: Gastroenterology

## 2022-09-07 DIAGNOSIS — Z5181 Encounter for therapeutic drug level monitoring: Secondary | ICD-10-CM | POA: Diagnosis not present

## 2022-09-07 DIAGNOSIS — M5416 Radiculopathy, lumbar region: Secondary | ICD-10-CM | POA: Diagnosis not present

## 2022-09-18 DIAGNOSIS — Z5181 Encounter for therapeutic drug level monitoring: Secondary | ICD-10-CM | POA: Diagnosis not present

## 2022-10-17 DIAGNOSIS — M79671 Pain in right foot: Secondary | ICD-10-CM | POA: Diagnosis not present

## 2022-10-17 DIAGNOSIS — S93331D Other subluxation of right foot, subsequent encounter: Secondary | ICD-10-CM | POA: Diagnosis not present

## 2022-10-17 DIAGNOSIS — M79674 Pain in right toe(s): Secondary | ICD-10-CM | POA: Diagnosis not present

## 2022-10-17 DIAGNOSIS — M779 Enthesopathy, unspecified: Secondary | ICD-10-CM | POA: Diagnosis not present

## 2022-10-18 DIAGNOSIS — F331 Major depressive disorder, recurrent, moderate: Secondary | ICD-10-CM | POA: Diagnosis not present

## 2022-10-31 DIAGNOSIS — K3189 Other diseases of stomach and duodenum: Secondary | ICD-10-CM

## 2022-10-31 NOTE — Telephone Encounter (Signed)
Please arrange MRCP of liver due to history of portal hypertension. Need to rule out PSC.

## 2022-10-31 NOTE — Addendum Note (Signed)
Addended by: Armstead Peaks on: 10/31/2022 02:41 PM   Modules accepted: Orders

## 2022-10-31 NOTE — Telephone Encounter (Signed)
MRI/MRCP approved via [employer].  Auth# [id number], DOS: 10/31/22-04/29/23

## 2022-11-15 DIAGNOSIS — J439 Emphysema, unspecified: Secondary | ICD-10-CM | POA: Diagnosis not present

## 2022-11-15 DIAGNOSIS — R69 Illness, unspecified: Secondary | ICD-10-CM | POA: Diagnosis not present

## 2022-11-15 DIAGNOSIS — M5412 Radiculopathy, cervical region: Secondary | ICD-10-CM | POA: Diagnosis not present

## 2022-11-15 DIAGNOSIS — G894 Chronic pain syndrome: Secondary | ICD-10-CM | POA: Diagnosis not present

## 2022-11-16 ENCOUNTER — Ambulatory Visit: Payer: Medicare HMO | Admitting: Gastroenterology

## 2022-11-16 ENCOUNTER — Encounter: Payer: Self-pay | Admitting: Gastroenterology

## 2022-11-16 VITALS — BP 133/88 | HR 80 | Temp 97.6°F | Ht 72.0 in | Wt 203.8 lb

## 2022-11-16 DIAGNOSIS — K766 Portal hypertension: Secondary | ICD-10-CM

## 2022-11-16 DIAGNOSIS — K5900 Constipation, unspecified: Secondary | ICD-10-CM

## 2022-11-16 NOTE — Progress Notes (Signed)
Gastroenterology Office Note     Primary Care Physician:  Elfredia Nevins, MD  Primary Gastroenterologist: Dr. Jena Gauss    Chief Complaint   Chief Complaint  Patient presents with   Follow-up    Pt here for follow up on his treatment plan for MRI     History of Present Illness   Jerome Fluegel. is a 62 y.o. male presenting today in follow-up with a history of chronic OIC, severe spinal stenosis, IDA in 2021, fatty liver with splenomegaly and changes of portal hypertension, returning for routine follow-up   4 colace, 3 dulcolax,  one amitiza 24 mcg daily.  He has to ration Amitiza due to insurance cost. Movantik, motegrity, linzess in past without help. Drinking more water. Eating more.   EGD recently with Grade 2 esophageal varices, +candida esophagitis, severe portal gastropathy with active oozing, isolated GE varices proximal stomach, normal duodenum. Discordant results with EGD findings and elastograpy.   Elastography recently normal with kPa at 2.8. Need to consider other causes of portal hypertension without cirrhosis. (Veno-occlusive disease, sarcoid, PSC, PBC)    Iron infusion Jan 2024.   Evaluation thus far:  Elastography Sept 2023: no significant fibrosis Doppler Jan 2024: patent portal and hepatic veins Labs: Immunoglobulins unimpressive except mildly low IgG, IgG4 normal, AMA, ANA, ACE all normal  MRI upcoming to evaluate for PSC.     Past Medical History:  Diagnosis Date   Anxiety    COPD (chronic obstructive pulmonary disease)    DJD (degenerative joint disease)    Foot drop, left    H/O cardiovascular stress test 2013   H/O echocardiogram 2012   for cad, htn & obesity   History of cardiac catheterization    a. normal cors by cath in 2011   Hypertension    Spinal stenosis    Thrombocytopenia     Past Surgical History:  Procedure Laterality Date   BACK SURGERY     x 4   BIOPSY  05/27/2020   Procedure: BIOPSY;  Surgeon: Corbin Ade, MD;  [facility];  Service: Endoscopy;;   BREAST REDUCTION SURGERY     CARDIAC CATHETERIZATION  2011   5 frech sheath   CERVICAL SPINE SURGERY     COLONOSCOPY N/A 10/14/2012   [id number] rectum and colon   COLONOSCOPY WITH PROPOFOL N/A 05/27/2020   normal   CYST EXCISION Right 08/08/2016   Procedure: EXCISION OF RIGHT INGUINAL SUBCUTANEOUS MASS (LIKELY SEBACEIYS CYST);  Surgeon: Ancil Linsey, MD;  Location: [facility];  Service: General;  Laterality: Right;  7cm x 4cm mass; dermabond dressing   ESOPHAGEAL BRUSHING  05/24/2022   Procedure: ESOPHAGEAL BRUSHING;  Surgeon: Corbin Ade, MD;  [facility];  Service: Endoscopy;;   ESOPHAGOGASTRODUODENOSCOPY (EGD) WITH PROPOFOL N/A 05/27/2020   Grade 1-2 esophageal varices, possible Barrett's s/p biopsy (negative Barretts), portal gastropathy. Gastropathy could be source of occult GI bleeding. Negative H.pylori.   ESOPHAGOGASTRODUODENOSCOPY (EGD) WITH PROPOFOL N/A 05/24/2022   Procedure: ESOPHAGOGASTRODUODENOSCOPY (EGD) WITH PROPOFOL;  Surgeon: Corbin Ade, MD;  [facility];  Service: Endoscopy;  Laterality: N/A;  9:00am, asa 3   FLEXIBLE SIGMOIDOSCOPY N/A 10/15/2013   markedly inflamed distal rectum with ulceration, rectal polyp benign. Acute ulceration of rectum.    GIVENS CAPSULE STUDY N/A 01/12/2021   Procedure: GIVENS CAPSULE STUDY;  Surgeon: Corbin Ade, MD;  [facility];  Service: Endoscopy;  Laterality: N/A;  8:30am  h/o cardiac cath     LAPAROSCOPIC GASTRIC SLEEVE RESECTION  06/2012   Dr Roe [facility]utherford [facility])   LUMBAR DISC SURGERY     x2    Current Outpatient Medications  Medication Sig Dispense Refill   acetaminophen (TYLENOL) 500 MG tablet Take 1,000 mg by mouth 2 (two) times daily as needed for moderate pain or headache.      bisacodyl (DULCOLAX) 5 MG EC tablet Take 10 mg by mouth at bedtime.     Calcium Carb-Cholecalciferol (CALCIUM/VITAMIN D PO) Take 1 tablet by mouth daily.      cetirizine (ZYRTEC) 10 MG tablet Take 10 mg by mouth daily as needed for allergies.     Cholecalciferol (VITAMIN D) 50 MCG (2000 UT) tablet Take 2,000 Units by mouth daily.     clonazePAM (KLONOPIN) 1 MG tablet Take 1 mg by mouth 2 (two) times daily.      cyclobenzaprine (FLEXERIL) 10 MG tablet Take 5 mg by mouth at bedtime as needed for muscle spasms.     diclofenac Sodium (VOLTAREN) 1 % GEL Apply 2 g topically 3 (three) times daily as needed (Pain).     docusate sodium (COLACE) 100 MG capsule Take 300 mg by mouth at bedtime.     Flaxseed, Linseed, (FLAX SEED OIL) 1000 MG CAPS Take 1,000 mg by mouth daily. With Fish Oil     HYDROcodone-acetaminophen (NORCO/VICODIN) 5-325 MG tablet Take 1 tablet by mouth every 6 (six) hours as needed for moderate pain.     losartan (COZAAR) 25 MG tablet Take 0.5 tablets (12.5 mg total) by mouth daily. 45 tablet 3   lubiprostone (AMITIZA) 24 MCG capsule TAKE 1 CAPSULE BY MOUTH TWICE DAILY WITH FOOD 60 capsule 5   Multiple Vitamin (MULTIVITAMIN WITH MINERALS) TABS tablet Take 1 tablet by mouth daily.     omeprazole (PRILOSEC) 20 MG capsule Take 20 mg by mouth at bedtime.     pravastatin (PRAVACHOL) 40 MG tablet Take 40 mg by mouth at bedtime.     sertraline (ZOLOFT) 100 MG tablet Take 100 mg by mouth daily.     tamsulosin (FLOMAX) 0.4 MG CAPS capsule Take 0.4 mg by mouth daily. (Patient not taking: Reported on 11/16/2022)     No current facility-administered medications for this visit.    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[place])  Tobacco Use   Smoking status: Every Day    Packs/day: 0.25    Years: 36.00    Additional pack years: 0.00    Total pack years: 9.00    Types: Cigarettes   Smokeless tobacco: Never  Vaping Use   Vaping Use: Never used  Substance and Sexual Activity   Alcohol use: Yes    Alcohol/week: 0.0 standard drinks of alcohol    Comment: occasionally    Drug use: No   Sexual activity: Yes  Partners: Male  Other Topics Concern   Not on file  Social History Narrative   Moving to [place] to care for brother w/ brain tumor   Lives w/ 2 daughters & 4 grandsons   Social Determinants of Health   Financial Resource Strain: Not on file  Food Insecurity: Not on file  Transportation Needs: Not on file  Physical Activity: Not on file  Stress: Not on file  Social Connections: Not on file  Intimate Partner Violence: Not on file     Review of Systems   Gen: Denies any fever, chills, fatigue, weight loss, lack of appetite.  CV: Denies chest pain, heart palpitations, peripheral edema, syncope.  Resp: Denies shortness of breath at rest or with exertion. Denies wheezing or cough.  GI: Denies dysphagia or odynophagia. Denies jaundice, hematemesis, fecal incontinence. GU : Denies urinary burning, urinary frequency, urinary hesitancy MS: Denies joint pain, muscle weakness, cramps, or limitation of movement.  Derm: Denies rash, itching, dry skin Psych: Denies depression, anxiety, memory loss, and confusion Heme: Denies bruising, bleeding, and enlarged lymph nodes.   Physical Exam   BP 133/88   Pulse 80   Temp 97.6 F (36.4 C)   Ht 6' (1.829 m)   Wt 203 lb 12.8 oz (92.4 kg)   BMI 27.64 kg/m  General:   Alert and oriented. Pleasant and cooperative. Well-nourished and well-developed.  Head:  Normocephalic and atraumatic. Eyes:  Without icterus Abdomen:   +BS, soft, non-tender and non-distended. No HSM noted. No guarding or rebound. No masses appreciated.  Rectal:  Deferred  Msk:  Symmetrical without gross deformities. Normal posture. Extremities:  Without edema. Neurologic:  Alert and  oriented x4;  grossly normal neurologically. Skin:  Intact without significant lesions or rashes. Psych:  Alert and cooperative. Normal mood and affect.   Assessment   Jerome Age. is a 62 y.o. male presenting today in follow-up with a history of OIC and portal gastropathy but with discordant results with EGD findings and US hepatic steatosis/elastography no fibrosis.  Interesting scenario. Thus far, doppler ultrasound with patency, immunoglobulins unimpressive, IgG 4 normal, AMA, ANA, and ACE all normal. MRI is now upcoming to evaluate for PSC.   OIC: continue with current regimen.    PLAN    Upcoming MRI Continue current bowel regimen with Amitiza and stool softeners Follow-up appt to be determined pending MRI results   Jerome Mink, PhD, ANP-BC [facility] Gastroenterology

## 2022-11-16 NOTE — Patient Instructions (Addendum)
Keep upcoming MRI as scheduled, and I will be in touch with any further steps!  Always good to see you.   We will figure out best upcoming appointment after MRI is completed!   I enjoyed seeing you again today! At our first visit, I mentioned how I value our relationship and want to provide genuine, compassionate, and quality care. You may receive a survey regarding your visit with me, and I welcome your feedback! Thanks so much for taking the time to complete this. I look forward to seeing you again.   Gelene Mink, PhD, ANP-BC [facility] Gastroenterology

## 2022-11-22 ENCOUNTER — Other Ambulatory Visit: Payer: Self-pay | Admitting: Gastroenterology

## 2022-11-22 ENCOUNTER — Ambulatory Visit (HOSPITAL_COMMUNITY)
Admission: RE | Admit: 2022-11-22 | Discharge: 2022-11-22 | Disposition: A | Discharge status: Home / Self Care | Payer: Medicare HMO | Source: Ambulatory Visit | Attending: Gastroenterology | Admitting: Gastroenterology

## 2022-11-22 DIAGNOSIS — K3189 Other diseases of stomach and duodenum: Secondary | ICD-10-CM

## 2022-11-22 DIAGNOSIS — R935 Abnormal findings on diagnostic imaging of other abdominal regions, including retroperitoneum: Secondary | ICD-10-CM | POA: Diagnosis not present

## 2022-11-22 DIAGNOSIS — K8020 Calculus of gallbladder without cholecystitis without obstruction: Secondary | ICD-10-CM | POA: Diagnosis not present

## 2022-11-22 DIAGNOSIS — K766 Portal hypertension: Secondary | ICD-10-CM | POA: Insufficient documentation

## 2022-11-22 MED ORDER — GADOBUTROL 1 MMOL/ML IV SOLN
10.0000 mL | Freq: Once | INTRAVENOUS | Status: AC | PRN
  Administered 2022-11-22: 10 mL via INTRAVENOUS

## 2022-12-04 ENCOUNTER — Encounter: Payer: Self-pay | Admitting: Gastroenterology

## 2022-12-07 DIAGNOSIS — F331 Major depressive disorder, recurrent, moderate: Secondary | ICD-10-CM | POA: Diagnosis not present

## 2022-12-11 DIAGNOSIS — M549 Dorsalgia, unspecified: Secondary | ICD-10-CM | POA: Diagnosis not present

## 2022-12-11 DIAGNOSIS — M47812 Spondylosis without myelopathy or radiculopathy, cervical region: Secondary | ICD-10-CM | POA: Diagnosis not present

## 2022-12-11 DIAGNOSIS — M5416 Radiculopathy, lumbar region: Secondary | ICD-10-CM | POA: Diagnosis not present

## 2022-12-11 DIAGNOSIS — Z5181 Encounter for therapeutic drug level monitoring: Secondary | ICD-10-CM | POA: Diagnosis not present

## 2022-12-11 DIAGNOSIS — M5412 Radiculopathy, cervical region: Secondary | ICD-10-CM | POA: Diagnosis not present

## 2022-12-11 DIAGNOSIS — G894 Chronic pain syndrome: Secondary | ICD-10-CM | POA: Diagnosis not present

## 2022-12-14 ENCOUNTER — Encounter: Payer: Self-pay | Admitting: *Deleted

## 2022-12-19 ENCOUNTER — Telehealth: Payer: Medicare HMO | Admitting: Adult Health

## 2022-12-19 DIAGNOSIS — G4733 Obstructive sleep apnea (adult) (pediatric): Secondary | ICD-10-CM | POA: Diagnosis not present

## 2022-12-19 NOTE — Progress Notes (Signed)
PATIENT: Jerome [name]. DOB: [birth year]  REASON FOR VISIT: follow up HISTORY FROM: patient  Virtual Visit via Video Note  I connected with Jerome [name]. on 12/19/22 at  1:15 PM EDT by a video enabled telemedicine application located remotely at [facility] and verified that I am speaking with the correct person using two identifiers who was located at their own home.   I discussed the limitations of evaluation and management by telemedicine and the availability of in person appointments. The patient expressed understanding and agreed to proceed.   PATIENT: Jerome [name]. DOB: [birth year]  REASON FOR VISIT: follow up HISTORY FROM: patient  HISTORY OF PRESENT ILLNESS: Today 12/19/22:  Jerome [name]. is a 62 y.o. male with a history of OSA on CPAP. Returns today for follow-up. Reports that CPAP is working well. Download shows that he uses his machine 27 out of 30 days for compliance of 90%.  He uses machine greater than 4 hours for compliance of 60%.  On average he uses his machine 5 hours and 29 minutes.  His residual AHI is 7.4 on 6-16 cmH2O with EPR 3.  He reports that he sometimes has trouble sleeping due to pain.  He returns today for virtual visit    12/20/21:Mr. Kelty is a 62 year old male with a history of OSA on CPAP. He returns today for follow-up. CPAP reports shows that he used machine 26/30 days. Used >4 hours for compliance of 80 %. Average usage is 5 hours and 45 minutes.  Residual AHI is 6.7 on 6 to 16 cm of water with EPR 3 . missed some days d/t sleeping in the recliner d/t sciatic nerve pain. Otherwise no new issues with CPAP.  12/21/20:Mr. Himmelreich is a 62 year old male with a history of OSA on CPAP. He joins me today for a video visit. His download indicates that he uses his machine 28/30 for compliance of 93.3%. he used his machine > 4 hours for compliance of 86.7%. On average he uses his machine 5 hours 43 mins.  His residual AHI  is 5.3 on 6 to 16 cm of water with EPR of 3.  He reports that the CPAP is working well for him.  He denies any new issues.  06/22/20: Mr. Goetschius is a 62 year old male with a history of OSA on CPAP.  His download indicates that he uses his machine 29 out of 30 days for compliance of 93.5%.  He uses machine greater than 4 hours for compliance of 83.9%.  On average he uses his machine 6 hours and 26 minutes.  His residual AHI is 5.5 on 6 to 16 cm of water with EPR 3.  He reports that the CPAP is working well for him.  He has noticed a significant difference in how he feels.  He is not happy with the full facemask.  He states that it makes the bridge of his nose very sore.  He is currently putting Band-Aids across his nose to add more comfort.  He returns today for virtual visit  HISTORY 06/10/20:   Mr. Filyaw is a 62 year old male with a history of obstructive sleep apnea on CPAP.  His download indicates that he uses machine 27 out of 30 days for compliance of 90%.  He uses machine greater than 4 hours for compliance of 73.3%.  On average he uses his machine 6 hours and 50 minutes.  His residual  AHI is 5.4 on 6 to 16 cm of water.  He reports that he has noticed the biggest change since he started using CPAP.  He is having a hard time using a full facemask.  He states that it rubs a spot on the top of his nose.  He returns today for follow-up.   He does note that he just got machine October 20 so we are not quite at 31 days yet for his initial CPAP visit.  REVIEW OF SYSTEMS: Out of a complete 14 system review of symptoms, the patient complains only of the following symptoms, and all other reviewed systems are negative.  See HPI  ALLERGIES: Allergies  Allergen Reactions   Aspirin Nausea And Vomiting    Due to gastric sleeve surgery    Codeine Nausea And Vomiting   Nsaids Anxiety    Due to gastric sleeve surgery  N/V    HOME MEDICATIONS: Outpatient Medications Prior to Visit  Medication Sig  Dispense Refill   acetaminophen (TYLENOL) 500 MG tablet Take 1,000 mg by mouth 2 (two) times daily as needed for moderate pain or headache.      bisacodyl (DULCOLAX) 5 MG EC tablet Take 10 mg by mouth at bedtime.     Calcium Carb-Cholecalciferol (CALCIUM/VITAMIN D PO) Take 1 tablet by mouth daily.     cetirizine (ZYRTEC) 10 MG tablet Take 10 mg by mouth daily as needed for allergies.     Cholecalciferol (VITAMIN D) 50 MCG (2000 UT) tablet Take 2,000 Units by mouth daily.     clonazePAM (KLONOPIN) 1 MG tablet Take 1 mg by mouth 2 (two) times daily.      cyclobenzaprine (FLEXERIL) 10 MG tablet Take 5 mg by mouth at bedtime as needed for muscle spasms.     diclofenac Sodium (VOLTAREN) 1 % GEL Apply 2 g topically 3 (three) times daily as needed (Pain).     docusate sodium (COLACE) 100 MG capsule Take 300 mg by mouth at bedtime.     Flaxseed, Linseed, (FLAX SEED OIL) 1000 MG CAPS Take 1,000 mg by mouth daily. With Fish Oil     HYDROcodone-acetaminophen (NORCO/VICODIN) 5-325 MG tablet Take 1 tablet by mouth every 6 (six) hours as needed for moderate pain.     losartan (COZAAR) 25 MG tablet Take 0.5 tablets (12.5 mg total) by mouth daily. 45 tablet 3   lubiprostone (AMITIZA) 24 MCG capsule TAKE 1 CAPSULE BY MOUTH TWICE DAILY WITH FOOD 60 capsule 5   Multiple Vitamin (MULTIVITAMIN WITH MINERALS) TABS tablet Take 1 tablet by mouth daily.     omeprazole (PRILOSEC) 20 MG capsule Take 20 mg by mouth at bedtime.     pravastatin (PRAVACHOL) 40 MG tablet Take 40 mg by mouth at bedtime.     sertraline (ZOLOFT) 100 MG tablet Take 100 mg by mouth daily.     tamsulosin (FLOMAX) 0.4 MG CAPS capsule Take 0.4 mg by mouth daily. (Patient not taking: Reported on 11/16/2022)     No facility-administered medications prior to visit.    PAST MEDICAL HISTORY: Past Medical History:  Diagnosis Date   Anxiety    COPD (chronic obstructive pulmonary disease) (HCC)    DJD (degenerative joint disease)    Foot drop, left     H/O cardiovascular stress test 2013   H/O echocardiogram 2012   for cad, htn & obesity   History of cardiac catheterization    a. normal cors by cath in 2011   Hypertension    Spinal  stenosis    Thrombocytopenia (HCC)     PAST SURGICAL HISTORY: Past Surgical History:  Procedure Laterality Date   BACK SURGERY     x 4   BIOPSY  05/27/2020   Procedure: BIOPSY;  Surgeon: Corbin Ade, MD;  [facility];  Service: Endoscopy;;   BREAST REDUCTION SURGERY     CARDIAC CATHETERIZATION  2011   5 frech sheath   CERVICAL SPINE SURGERY     COLONOSCOPY N/A 10/14/2012   FAO:ZHYQMV rectum and colon   COLONOSCOPY WITH PROPOFOL N/A 05/27/2020   normal   CYST EXCISION Right 08/08/2016   Procedure: EXCISION OF RIGHT INGUINAL SUBCUTANEOUS MASS (LIKELY SEBACEIYS CYST);  Surgeon: Ancil Linsey, MD;  Location: [facility];  Service: General;  Laterality: Right;  7cm x 4cm mass; dermabond dressing   ESOPHAGEAL BRUSHING  05/24/2022   Procedure: ESOPHAGEAL BRUSHING;  Surgeon: Corbin Ade, MD;  [facility];  Service: Endoscopy;;   ESOPHAGOGASTRODUODENOSCOPY (EGD) WITH PROPOFOL N/A 05/27/2020   Grade 1-2 esophageal varices, possible Barrett's s/p biopsy (negative Barretts), portal gastropathy. Gastropathy could be source of occult GI bleeding. Negative H.pylori.   ESOPHAGOGASTRODUODENOSCOPY (EGD) WITH PROPOFOL N/A 05/24/2022   Procedure: ESOPHAGOGASTRODUODENOSCOPY (EGD) WITH PROPOFOL;  Surgeon: Corbin Ade, MD;  [facility];  Service: Endoscopy;  Laterality: N/A;  9:00am, asa 3   FLEXIBLE SIGMOIDOSCOPY N/A 10/15/2013   markedly inflamed distal rectum with ulceration, rectal polyp benign. Acute ulceration of rectum.    GIVENS CAPSULE STUDY N/A 01/12/2021   Procedure: GIVENS CAPSULE STUDY;  Surgeon: Corbin Ade, MD;  [facility];  Service: Endoscopy;  Laterality: N/A;  8:30am   h/o cardiac cath     LAPAROSCOPIC GASTRIC SLEEVE RESECTION  06/2012   Dr  Roe Rutherford [facility])   LUMBAR DISC SURGERY     x2    FAMILY HISTORY: Family History  Problem Relation Age of Onset   Heart attack Father    Colon cancer Paternal Grandmother    Brain cancer Brother 81    SOCIAL HISTORY: 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[place])  Tobacco Use   Smoking status: Every Day    Packs/day: 0.25    Years: 36.00    Additional pack years: 0.00    Total pack years: 9.00    Types: Cigarettes   Smokeless tobacco: Never  Vaping Use   Vaping Use: Never used  Substance and Sexual Activity   Alcohol use: Yes    Alcohol/week: 0.0 standard drinks of alcohol    Comment: occasionally    Drug use: No   Sexual activity: Yes    Partners: Male  Other Topics Concern   Not on file  Social History Narrative   Moving to [place] to care for brother w/ brain tumor   Lives w/ 2 daughters & 4 grandsons   Social Determinants of Health   Financial Resource Strain: Not on file  Food Insecurity: Not on file  Transportation Needs: Not on file  Physical Activity: Not on file  Stress: Not on file  Social Connections: Not on file  Intimate Partner Violence: Not on file      PHYSICAL EXAM Generalized: Well developed, in no acute distress  Neurological examination  Mentation: Alert oriented to time, place, history taking. Follows all commands speech and language fluent Cranial nerve II-XII:Extraocular movements were full. Facial symmetry noted. Head turning and shoulder shrug  were normal and symmetric.   DIAGNOSTIC DATA (LABS, IMAGING, TESTING) - I reviewed patient records, labs, notes, testing and imaging myself where available.  Lab Results  Component Value Date   WBC 5.3 05/22/2022   HGB 15.1 05/22/2022   HCT 43.8 05/22/2022   MCV  93.6 05/22/2022   PLT 125 (L) 05/22/2022      Component Value Date/Time   NA 138 11/16/2020 1405   K 3.9 11/16/2020 1405   CL 102 11/16/2020 1405   CO2 30 11/16/2020 1405   GLUCOSE 79 11/16/2020 1405   BUN 11 11/16/2020 1405   CREATININE 0.84 11/16/2020 1405   CALCIUM 9.3 11/16/2020 1405   PROT 6.4 11/16/2020 1405   ALBUMIN 4.4 07/27/2013 1128   AST 15 11/16/2020 1405   ALT 13 11/16/2020 1405   ALKPHOS 87 07/27/2013 1128   BILITOT 0.7 11/16/2020 1405   GFRNONAA 97 11/16/2020 1405   GFRAA 112 11/16/2020 1405    Lab Results  Component Value Date   TSH 1.20 11/29/2020      ASSESSMENT AND PLAN 62 y.o. year old male  has a past medical history of Anxiety, COPD (chronic obstructive pulmonary disease) (HCC), DJD (degenerative joint disease), Foot drop, left, H/O cardiovascular stress test (2013), H/O echocardiogram (2012), History of cardiac catheterization, Hypertension, Spinal stenosis, and Thrombocytopenia (HCC). here with:  OSA on CPAP  CPAP compliance excellent Residual AHI is slightly elevated but has been stable Encouraged patient to continue using CPAP nightly and > 4 hours each night F/U in 1 year or sooner if needed  Butch Penny, MSN, NP-C 12/19/2022, 1:19 PM [facility] [address], [address] [place], [place] [address] [phone]

## 2022-12-28 DIAGNOSIS — M47812 Spondylosis without myelopathy or radiculopathy, cervical region: Secondary | ICD-10-CM | POA: Diagnosis not present

## 2023-01-08 ENCOUNTER — Telehealth: Payer: Self-pay | Admitting: Gastroenterology

## 2023-01-08 NOTE — Telephone Encounter (Signed)
Spoke with patient about next step being liver biopsy. He is aware and due to some medical bills, would like to hold off on that currently.  Dena: patient is asking if you have had any PA requests come through for Amitiza? He got notice of this with his insurance. He communicates through MyChart, so any updates can be sent there!

## 2023-01-08 NOTE — Telephone Encounter (Signed)
Addressed via telephone. Please see phone message dated 6/17.

## 2023-01-09 NOTE — Telephone Encounter (Signed)
Noted: Contacting the pt through MyChart regarding this Rx     Hi Mr Bedsole,   I attempted to do a PA for your Amitiza and it is stating you did not need one to get this medication. Do you still have [employer] Medicare?

## 2023-01-09 NOTE — Telephone Encounter (Signed)
See pt advise notes

## 2023-01-10 ENCOUNTER — Telehealth: Payer: Self-pay

## 2023-01-10 NOTE — Telephone Encounter (Signed)
PA done for Lubiprostone 24 mcg. Denied because the pt has not tried and failed. Rx denied. Sent in an appeal. Waiting for a response from Cover My Meds

## 2023-01-10 NOTE — Telephone Encounter (Signed)
Appeal approved from 07/24/2022 -07/24/2023. Pt is advised of this and scanned to the pt's chart

## 2023-01-17 DIAGNOSIS — D696 Thrombocytopenia, unspecified: Secondary | ICD-10-CM | POA: Diagnosis not present

## 2023-01-17 DIAGNOSIS — E611 Iron deficiency: Secondary | ICD-10-CM | POA: Diagnosis not present

## 2023-01-17 DIAGNOSIS — K909 Intestinal malabsorption, unspecified: Secondary | ICD-10-CM | POA: Diagnosis not present

## 2023-01-17 DIAGNOSIS — Z9884 Bariatric surgery status: Secondary | ICD-10-CM | POA: Diagnosis not present

## 2023-01-31 DIAGNOSIS — F331 Major depressive disorder, recurrent, moderate: Secondary | ICD-10-CM | POA: Diagnosis not present

## 2023-01-31 DIAGNOSIS — F418 Other specified anxiety disorders: Secondary | ICD-10-CM | POA: Diagnosis not present

## 2023-02-15 DIAGNOSIS — M779 Enthesopathy, unspecified: Secondary | ICD-10-CM | POA: Diagnosis not present

## 2023-02-15 DIAGNOSIS — M79675 Pain in left toe(s): Secondary | ICD-10-CM | POA: Diagnosis not present

## 2023-02-15 DIAGNOSIS — S93332D Other subluxation of left foot, subsequent encounter: Secondary | ICD-10-CM | POA: Diagnosis not present

## 2023-02-15 DIAGNOSIS — M79672 Pain in left foot: Secondary | ICD-10-CM | POA: Diagnosis not present

## 2023-03-05 ENCOUNTER — Other Ambulatory Visit: Payer: Self-pay | Admitting: Gastroenterology

## 2023-03-07 DIAGNOSIS — M533 Sacrococcygeal disorders, not elsewhere classified: Secondary | ICD-10-CM | POA: Diagnosis not present

## 2023-03-07 DIAGNOSIS — M549 Dorsalgia, unspecified: Secondary | ICD-10-CM | POA: Diagnosis not present

## 2023-03-07 DIAGNOSIS — M5416 Radiculopathy, lumbar region: Secondary | ICD-10-CM | POA: Diagnosis not present

## 2023-03-07 DIAGNOSIS — M5417 Radiculopathy, lumbosacral region: Secondary | ICD-10-CM | POA: Diagnosis not present

## 2023-03-07 DIAGNOSIS — Z5181 Encounter for therapeutic drug level monitoring: Secondary | ICD-10-CM | POA: Diagnosis not present

## 2023-03-07 DIAGNOSIS — M5412 Radiculopathy, cervical region: Secondary | ICD-10-CM | POA: Diagnosis not present

## 2023-03-07 DIAGNOSIS — G894 Chronic pain syndrome: Secondary | ICD-10-CM | POA: Diagnosis not present

## 2023-03-07 DIAGNOSIS — M542 Cervicalgia: Secondary | ICD-10-CM | POA: Diagnosis not present

## 2023-03-07 DIAGNOSIS — M47812 Spondylosis without myelopathy or radiculopathy, cervical region: Secondary | ICD-10-CM | POA: Diagnosis not present

## 2023-03-07 DIAGNOSIS — D696 Thrombocytopenia, unspecified: Secondary | ICD-10-CM | POA: Diagnosis not present

## 2023-04-02 DIAGNOSIS — F418 Other specified anxiety disorders: Secondary | ICD-10-CM | POA: Diagnosis not present

## 2023-04-02 DIAGNOSIS — F331 Major depressive disorder, recurrent, moderate: Secondary | ICD-10-CM | POA: Diagnosis not present

## 2023-04-12 DIAGNOSIS — M48062 Spinal stenosis, lumbar region with neurogenic claudication: Secondary | ICD-10-CM | POA: Diagnosis not present

## 2023-04-12 DIAGNOSIS — Z6829 Body mass index (BMI) 29.0-29.9, adult: Secondary | ICD-10-CM | POA: Diagnosis not present

## 2023-04-12 DIAGNOSIS — J439 Emphysema, unspecified: Secondary | ICD-10-CM | POA: Diagnosis not present

## 2023-04-12 DIAGNOSIS — I700 Atherosclerosis of aorta: Secondary | ICD-10-CM | POA: Diagnosis not present

## 2023-04-12 DIAGNOSIS — E663 Overweight: Secondary | ICD-10-CM | POA: Diagnosis not present

## 2023-04-12 DIAGNOSIS — F419 Anxiety disorder, unspecified: Secondary | ICD-10-CM | POA: Diagnosis not present

## 2023-04-19 DIAGNOSIS — Z5181 Encounter for therapeutic drug level monitoring: Secondary | ICD-10-CM | POA: Diagnosis not present

## 2023-05-08 ENCOUNTER — Encounter: Payer: Self-pay | Admitting: Gastroenterology

## 2023-05-08 ENCOUNTER — Ambulatory Visit: Payer: Medicare HMO | Admitting: Gastroenterology

## 2023-05-08 VITALS — BP 124/80 | HR 66 | Temp 98.7°F | Ht 72.0 in | Wt 209.2 lb

## 2023-05-08 DIAGNOSIS — K766 Portal hypertension: Secondary | ICD-10-CM | POA: Diagnosis not present

## 2023-05-08 DIAGNOSIS — T402X5A Adverse effect of other opioids, initial encounter: Secondary | ICD-10-CM

## 2023-05-08 DIAGNOSIS — K3189 Other diseases of stomach and duodenum: Secondary | ICD-10-CM

## 2023-05-08 DIAGNOSIS — K5903 Drug induced constipation: Secondary | ICD-10-CM

## 2023-05-08 DIAGNOSIS — D509 Iron deficiency anemia, unspecified: Secondary | ICD-10-CM

## 2023-05-08 NOTE — Patient Instructions (Signed)
Please have blood work done when able.  We are arranging a routine ultrasound of the liver.  Your next endoscopy will be due in November 2025.  Please let me know when you would like to do the liver biopsy.  I hope you have a wonderful belated birthday!  I enjoyed seeing you again today! I value our relationship and want to provide genuine, compassionate, and quality care. You may receive a survey regarding your visit with me, and I welcome your feedback! Thanks so much for taking the time to complete this. I look forward to seeing you again.      Gelene Mink, PhD, ANP-BC [facility]

## 2023-05-08 NOTE — Progress Notes (Signed)
Gastroenterology Office Note     Primary Care Physician:  Elfredia Nevins, MD  Primary Gastroenterologist: Dr. Jena Gauss    Chief Complaint   Chief Complaint  Patient presents with   Follow-up    Follow up on constipation and portal hypertension     History of Present Illness   Jerome [name]. is a 62 y.o. male presenting today with a history of chronic OIC, severe spinal stenosis, IDA in 2021,  fatty liver with splenomegaly and changes of portal hypertension, returning for routine follow-up   Stool softeners, laxatives, in evening. Takes Amitiza 24 mcg once a day due to expense.   EGD recently with Grade 2 esophageal varices, +candida esophagitis, severe portal gastropathy with active oozing, isolated GE varices proximal stomach, normal duodenum. Discordant results with EGD findings and elastograpy.    Elastography Sept 2023 with kPa at 2.8 (kPa ration was 0.29, so reliable). Need to consider other causes of portal hypertension without cirrhosis. (Veno-occlusive disease, sarcoid, PSC, PBC)   Trying to hold off on liver biopsy due to finances. No abdominal pain, mental status changes, overt GI bleeding. He is at baseline for chronic GI issues in regards to constipation.    Iron infusion Jan 2024. Sept 2024 ferritin 120, iron 86. Eating lots of spinach, GLV. Hgb 15.6.    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 MRI due for surveillance May 2025.   Recommending liver biopsy.   EGD due in Nov 2025. US abdomen due now.    Procedures: EGD Nov 2023: Grade 2 esophageal varices, +candida esophagitis, severe portal gastropathy with active oozing, isolated GE varices proximal stomach, normal duodenum. Discordant results with EGD findings and elastograpy. Surveillance due  Nov 2025. Hx of orthostatic hypotension and not candidate for non-selective beta blocker.  Colonoscopy Nov 2021 normal  Capsule June 2022: scattered erosions. Did not reach cecum.      Past Medical History:  Diagnosis Date   Anxiety    COPD (chronic obstructive pulmonary disease) (HCC)    DJD (degenerative joint disease)    Foot drop, left    H/O cardiovascular stress test 2013   H/O echocardiogram 2012   for cad, htn & obesity   History of cardiac catheterization    a. normal cors by cath in 2011   Hypertension    Spinal stenosis    Thrombocytopenia [facility])     Past Surgical History:  Procedure Laterality Date   BACK SURGERY     x 4   BIOPSY  05/27/2020   Procedure: BIOPSY;  Surgeon: Corbin Ade, MD;  [facility];  Service: Endoscopy;;   BREAST REDUCTION SURGERY     CARDIAC CATHETERIZATION  2011   5 frech sheath   CERVICAL SPINE SURGERY     COLONOSCOPY N/A 10/14/2012   NWG:NFAOZH rectum and colon   COLONOSCOPY WITH PROPOFOL N/A 05/27/2020   normal   CYST EXCISION Right 08/08/2016   Procedure: EXCISION OF RIGHT INGUINAL SUBCUTANEOUS MASS (LIKELY SEBACEIYS CYST);  Surgeon: Ancil Linsey, MD;  Location: [facility];  Service: General;  Laterality: Right;  7cm x 4cm mass; dermabond dressing   ESOPHAGEAL BRUSHING  05/24/2022   Procedure: ESOPHAGEAL BRUSHING;  Surgeon: Corbin Ade, MD;  [facility];  Service: Endoscopy;;   ESOPHAGOGASTRODUODENOSCOPY (EGD) WITH  PROPOFOL N/A 05/27/2020   Grade 1-2 esophageal varices, possible Barrett's s/p biopsy (negative Barretts), portal gastropathy. Gastropathy could be source of occult GI bleeding. Negative H.pylori.   ESOPHAGOGASTRODUODENOSCOPY (EGD) WITH PROPOFOL N/A 05/24/2022   Procedure: ESOPHAGOGASTRODUODENOSCOPY (EGD) WITH PROPOFOL;  Surgeon: Corbin Ade, MD;  [facility];  Service: Endoscopy;  Laterality: N/A;  9:00am, asa 3   FLEXIBLE SIGMOIDOSCOPY N/A 10/15/2013   markedly inflamed  distal rectum with ulceration, rectal polyp benign. Acute ulceration of rectum.    GIVENS CAPSULE STUDY N/A 01/12/2021   Procedure: GIVENS CAPSULE STUDY;  Surgeon: Corbin Ade, MD;  [facility];  Service: Endoscopy;  Laterality: N/A;  8:30am   h/o cardiac cath     LAPAROSCOPIC GASTRIC SLEEVE RESECTION  06/2012   Dr Roe Rutherford [facility])   LUMBAR DISC SURGERY     x2    Current Outpatient Medications  Medication Sig Dispense Refill   acetaminophen (TYLENOL) 500 MG tablet Take 1,000 mg by mouth 2 (two) times daily as needed for moderate pain or headache.      bisacodyl (DULCOLAX) 5 MG EC tablet Take 10 mg by mouth at bedtime.     Calcium Carb-Cholecalciferol (CALCIUM/VITAMIN D PO) Take 1 tablet by mouth daily.     cetirizine (ZYRTEC) 10 MG tablet Take 10 mg by mouth daily as needed for allergies.     Cholecalciferol (VITAMIN D) 50 MCG (2000 UT) tablet Take 2,000 Units by mouth daily.     clonazePAM (KLONOPIN) 1 MG tablet Take 1 mg by mouth 2 (two) times daily.      cyclobenzaprine (FLEXERIL) 10 MG tablet Take 5 mg by mouth at bedtime as needed for muscle spasms.     diclofenac Sodium (VOLTAREN) 1 % GEL Apply 2 g topically 3 (three) times daily as needed (Pain).     docusate sodium (COLACE) 100 MG capsule Take 300 mg by mouth at bedtime.     Flaxseed, Linseed, (FLAX SEED OIL) 1000 MG CAPS Take 1,000 mg by mouth daily. With Fish Oil     HYDROcodone-acetaminophen (NORCO/VICODIN) 5-325 MG tablet Take 1 tablet by mouth every 6 (six) hours as needed for moderate pain.     losartan (COZAAR) 25 MG tablet Take 0.5 tablets (12.5 mg total) by mouth daily. 45 tablet 3   lubiprostone (AMITIZA) 24 MCG capsule TAKE 1 CAPSULE BY MOUTH TWICE A DAY WITH FOOD 180 capsule 1   Multiple Vitamin (MULTIVITAMIN WITH MINERALS) TABS tablet Take 1 tablet by mouth daily.     omeprazole (PRILOSEC) 20 MG capsule Take 20 mg by mouth at bedtime.     pravastatin (PRAVACHOL) 40 MG tablet Take 40 mg by mouth at  bedtime.     sertraline (ZOLOFT) 100 MG tablet Take 100 mg by mouth daily.     tamsulosin (FLOMAX) 0.4 MG CAPS capsule Take 0.4 mg by mouth daily. (Patient not taking: Reported on 11/16/2022)     No current facility-administered medications for this visit.    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[place])  Tobacco Use   Smoking status: Every Day    Current packs/day: 0.25    Average packs/day: 0.3 packs/day for 36.0 years (9.0 ttl pk-yrs)    Types: Cigarettes   Smokeless tobacco: Never  Vaping Use   Vaping status: Never Used  Substance and Sexual Activity   Alcohol use: Yes    Alcohol/week: 0.0 standard drinks of alcohol    Comment: occasionally    Drug use: No   Sexual activity: Yes    Partners: Male  Other Topics Concern   Not on file  Social History Narrative   Moving to [place] to care for brother w/ brain tumor   Lives w/ 2 daughters & 4 grandsons   Social Determinants of Health   Financial Resource Strain: Medium Risk (12/07/2022)   Received from [facility], [facility]   Overall Financial Resource Strain (CARDIA)    Difficulty of Paying Living Expenses: Somewhat hard  Food Insecurity: Food Insecurity Present (12/07/2022)   Received from [facility], [facility]   Hunger Vital Sign    Worried About Running Out of Food in the Last Year: Never true     Ran Out of Food in the Last Year: Sometimes true  Transportation Needs: No Transportation Needs (12/07/2022)   Received from [facility], [facility]   [facility] - Transportation    In the past 12 months, has lack of transportation kept you from medical appointments or from getting medications?: No    Lack of Transportation (Non-Medical): No  Physical Activity: Not on file  Stress: Not on file  Social Connections: Not on file  Intimate Partner Violence: Not on file     Review of Systems   Gen: Denies any fever, chills, fatigue, weight loss, lack of appetite.  CV: Denies chest pain, heart palpitations, peripheral edema, syncope.  Resp: Denies shortness of breath at rest or with exertion. Denies wheezing or cough.  GI: Denies dysphagia or odynophagia. Denies jaundice, hematemesis, fecal incontinence. GU : Denies urinary burning, urinary frequency, urinary hesitancy MS: Denies joint pain, muscle weakness, cramps, or limitation of movement.  Derm: Denies rash, itching, dry skin Psych: Denies depression, anxiety, memory loss, and confusion Heme: Denies bruising, bleeding, and enlarged lymph nodes.   Physical Exam   BP 124/80   Pulse 66   Temp 98.7 F (37.1 C)   Ht 6' (1.829 m)   Wt 209 lb 3.2 oz (94.9 kg)   BMI 28.37 kg/m  General:   Alert and oriented. Pleasant and cooperative. Well-nourished and well-developed.  Head:  Normocephalic and atraumatic. Eyes:  Without icterus Abdomen:  +BS, soft, non-tender and non-distended. No HSM noted. No guarding or rebound. No masses appreciated.  Rectal:  Deferred  Msk:  Symmetrical without gross deformities. Normal posture. Extremities:  Without edema. Neurologic:  Alert and  oriented x4;  grossly normal neurologically. Skin:  Intact without significant lesions or rashes. Psych:  Alert and cooperative. Normal mood and affect.   Assessment   Jerome [name]. is a 62 y.o. male presenting today  with a history of chronic OIC, severe spinal stenosis, IDA,  fatty liver with splenomegaly and changes of portal hypertension, returning for routine follow-up   OIC: continue on current novel regimen of Amitiza  24 mcg once daily (due to cost), supplementing with stool softeners/laxatives in evening.  Portal hypertension: without cirrhosis as noted above. To wrap up evaluation, needs liver biopsy. He would like to hold off for now due to finances but would like to do in the future and will let us know. Please see extensive evaluation above. EGD surveillance Nov 2025. Not candidate for non-selective beta blocker in light of orthostatic hypotension.   IDA: in setting of portal gastropathy, erosions small bowel. Hgb 15.6 recently, ferritin 120. Last iron infusion Jan 2024. Continue to follow with Hematology.     PLAN    Continue current bowel regimen RUQ [name] due now and labs Return in 6 months Liver biopsy when able   Jerome Mink, PhD, ANP-BC [facility]

## 2023-05-10 DIAGNOSIS — G4733 Obstructive sleep apnea (adult) (pediatric): Secondary | ICD-10-CM | POA: Diagnosis not present

## 2023-05-11 ENCOUNTER — Ambulatory Visit (HOSPITAL_COMMUNITY): Payer: Medicare HMO

## 2023-05-11 ENCOUNTER — Ambulatory Visit (HOSPITAL_COMMUNITY)
Admission: RE | Admit: 2023-05-11 | Discharge: 2023-05-11 | Disposition: A | Discharge status: Home / Self Care | Payer: Medicare HMO | Source: Ambulatory Visit | Attending: Gastroenterology | Admitting: Gastroenterology

## 2023-05-11 DIAGNOSIS — K766 Portal hypertension: Secondary | ICD-10-CM | POA: Diagnosis not present

## 2023-05-11 DIAGNOSIS — K8020 Calculus of gallbladder without cholecystitis without obstruction: Secondary | ICD-10-CM | POA: Diagnosis not present

## 2023-05-11 DIAGNOSIS — K760 Fatty (change of) liver, not elsewhere classified: Secondary | ICD-10-CM | POA: Diagnosis not present

## 2023-05-18 LAB — COMPREHENSIVE METABOLIC PANEL
ALT: 11 [IU]/L (ref 0–44)
AST: 17 [IU]/L (ref 0–40)
Albumin: 4.7 g/dL (ref 3.9–4.9)
Alkaline Phosphatase: 63 [IU]/L (ref 44–121)
BUN/Creatinine Ratio: 16 (ref 10–24)
BUN: 14 mg/dL (ref 8–27)
Bilirubin Total: 0.6 mg/dL (ref 0.0–1.2)
CO2: 23 mmol/L (ref 20–29)
Calcium: 9.8 mg/dL (ref 8.6–10.2)
Chloride: 103 mmol/L (ref 96–106)
Creatinine, Ser: 0.87 mg/dL (ref 0.76–1.27)
Globulin, Total: 2.1 g/dL (ref 1.5–4.5)
Glucose: 88 mg/dL (ref 70–99)
Potassium: 4.4 mmol/L (ref 3.5–5.2)
Sodium: 140 mmol/L (ref 134–144)
Total Protein: 6.8 g/dL (ref 6.0–8.5)
eGFR: 98 mL/min/{1.73_m2} (ref >59)

## 2023-05-18 LAB — AFP TUMOR MARKER: AFP, Serum, Tumor Marker: 3.9 ng/mL (ref 0.0–8.4)

## 2023-05-18 LAB — PROTIME-INR
INR: 1 (ref 0.9–1.2)
Prothrombin Time: 11.4 s (ref 9.1–12.0)

## 2023-05-30 DIAGNOSIS — G894 Chronic pain syndrome: Secondary | ICD-10-CM | POA: Diagnosis not present

## 2023-05-30 DIAGNOSIS — M542 Cervicalgia: Secondary | ICD-10-CM | POA: Diagnosis not present

## 2023-05-30 DIAGNOSIS — M5412 Radiculopathy, cervical region: Secondary | ICD-10-CM | POA: Diagnosis not present

## 2023-05-30 DIAGNOSIS — M533 Sacrococcygeal disorders, not elsewhere classified: Secondary | ICD-10-CM | POA: Diagnosis not present

## 2023-05-30 DIAGNOSIS — M5417 Radiculopathy, lumbosacral region: Secondary | ICD-10-CM | POA: Diagnosis not present

## 2023-05-30 DIAGNOSIS — M5416 Radiculopathy, lumbar region: Secondary | ICD-10-CM | POA: Diagnosis not present

## 2023-05-30 DIAGNOSIS — M549 Dorsalgia, unspecified: Secondary | ICD-10-CM | POA: Diagnosis not present

## 2023-05-30 DIAGNOSIS — D696 Thrombocytopenia, unspecified: Secondary | ICD-10-CM | POA: Diagnosis not present

## 2023-05-30 DIAGNOSIS — Z5181 Encounter for therapeutic drug level monitoring: Secondary | ICD-10-CM | POA: Diagnosis not present

## 2023-05-30 DIAGNOSIS — M47812 Spondylosis without myelopathy or radiculopathy, cervical region: Secondary | ICD-10-CM | POA: Diagnosis not present

## 2023-06-01 ENCOUNTER — Ambulatory Visit (HOSPITAL_COMMUNITY)
Admission: RE | Admit: 2023-06-01 | Discharge: 2023-06-01 | Disposition: A | Discharge status: Home / Self Care | Payer: Medicare HMO | Source: Ambulatory Visit | Attending: Internal Medicine | Admitting: Internal Medicine

## 2023-06-01 DIAGNOSIS — Z87891 Personal history of nicotine dependence: Secondary | ICD-10-CM | POA: Insufficient documentation

## 2023-06-01 DIAGNOSIS — Z122 Encounter for screening for malignant neoplasm of respiratory organs: Secondary | ICD-10-CM | POA: Insufficient documentation

## 2023-06-01 DIAGNOSIS — F17210 Nicotine dependence, cigarettes, uncomplicated: Secondary | ICD-10-CM | POA: Insufficient documentation

## 2023-06-04 DIAGNOSIS — F331 Major depressive disorder, recurrent, moderate: Secondary | ICD-10-CM | POA: Diagnosis not present

## 2023-06-04 DIAGNOSIS — F418 Other specified anxiety disorders: Secondary | ICD-10-CM | POA: Diagnosis not present

## 2023-06-10 DIAGNOSIS — G4733 Obstructive sleep apnea (adult) (pediatric): Secondary | ICD-10-CM | POA: Diagnosis not present

## 2023-06-28 ENCOUNTER — Other Ambulatory Visit: Payer: Self-pay | Admitting: Acute Care

## 2023-06-28 DIAGNOSIS — F17210 Nicotine dependence, cigarettes, uncomplicated: Secondary | ICD-10-CM

## 2023-06-28 DIAGNOSIS — Z87891 Personal history of nicotine dependence: Secondary | ICD-10-CM

## 2023-06-28 DIAGNOSIS — Z122 Encounter for screening for malignant neoplasm of respiratory organs: Secondary | ICD-10-CM

## 2023-07-06 DIAGNOSIS — Z125 Encounter for screening for malignant neoplasm of prostate: Secondary | ICD-10-CM | POA: Diagnosis not present

## 2023-07-06 DIAGNOSIS — Z0001 Encounter for general adult medical examination with abnormal findings: Secondary | ICD-10-CM | POA: Diagnosis not present

## 2023-07-06 DIAGNOSIS — R7309 Other abnormal glucose: Secondary | ICD-10-CM | POA: Diagnosis not present

## 2023-07-06 DIAGNOSIS — Z9229 Personal history of other drug therapy: Secondary | ICD-10-CM | POA: Diagnosis not present

## 2023-07-09 DIAGNOSIS — M5416 Radiculopathy, lumbar region: Secondary | ICD-10-CM | POA: Diagnosis not present

## 2023-07-10 DIAGNOSIS — H52223 Regular astigmatism, bilateral: Secondary | ICD-10-CM | POA: Diagnosis not present

## 2023-07-10 DIAGNOSIS — H524 Presbyopia: Secondary | ICD-10-CM | POA: Diagnosis not present

## 2023-07-10 DIAGNOSIS — G4733 Obstructive sleep apnea (adult) (pediatric): Secondary | ICD-10-CM | POA: Diagnosis not present

## 2023-07-10 DIAGNOSIS — H5213 Myopia, bilateral: Secondary | ICD-10-CM | POA: Diagnosis not present

## 2023-07-23 IMAGING — US US ABDOMEN LIMITED
1 series · 14 of 25 positions shown · non-contrast
Comparison: None.

CLINICAL DATA: Portal hypertension

EXAM:
ULTRASOUND ABDOMEN LIMITED RIGHT UPPER QUADRANT

[Series 1: us abdomen limited ruq (liver/gb) · 14 of 62 slices shown]
[im 1/62]
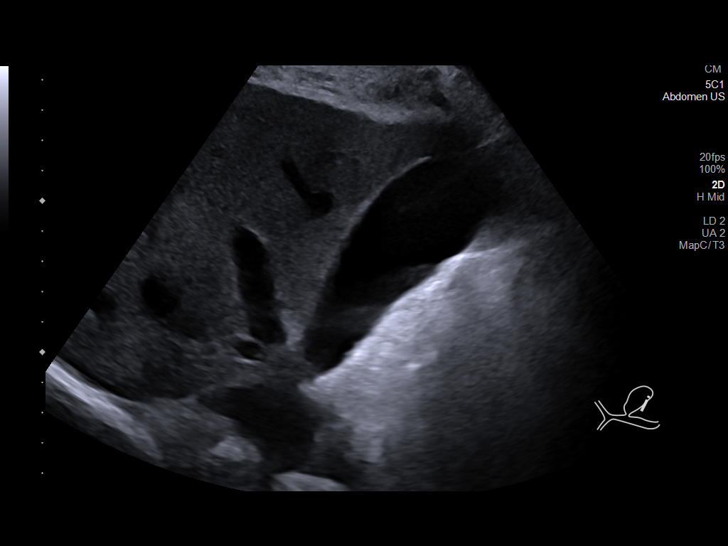
[im 6/62]
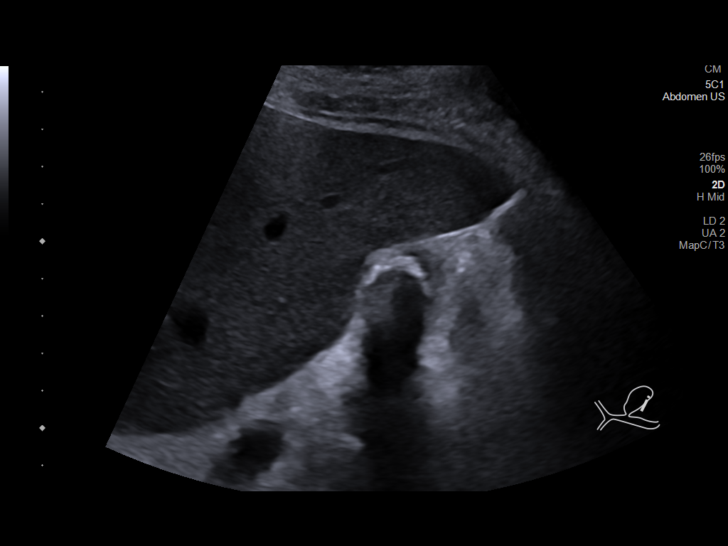
[im 11/62]
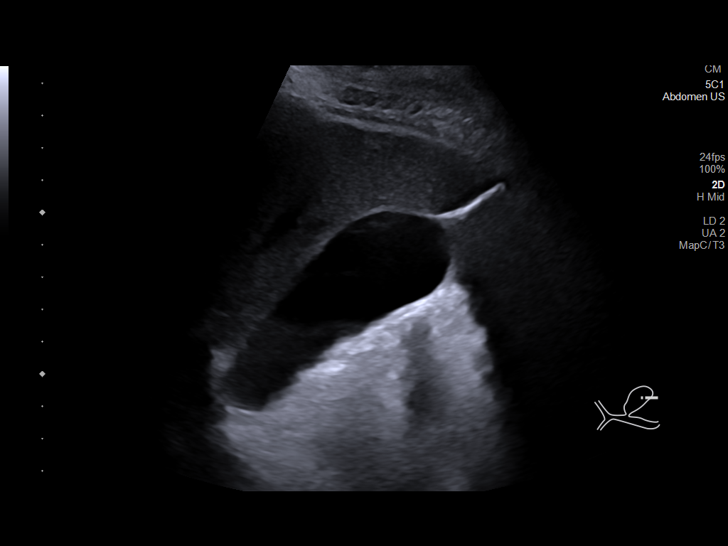
[im 16/62]
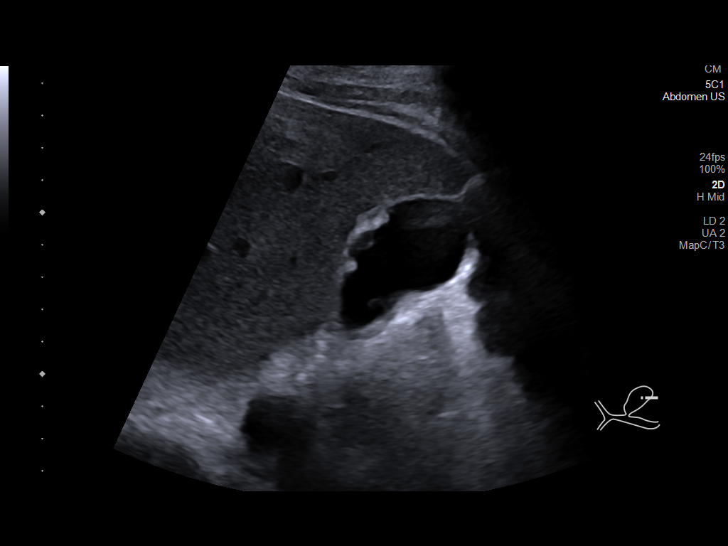
[im 21/62]
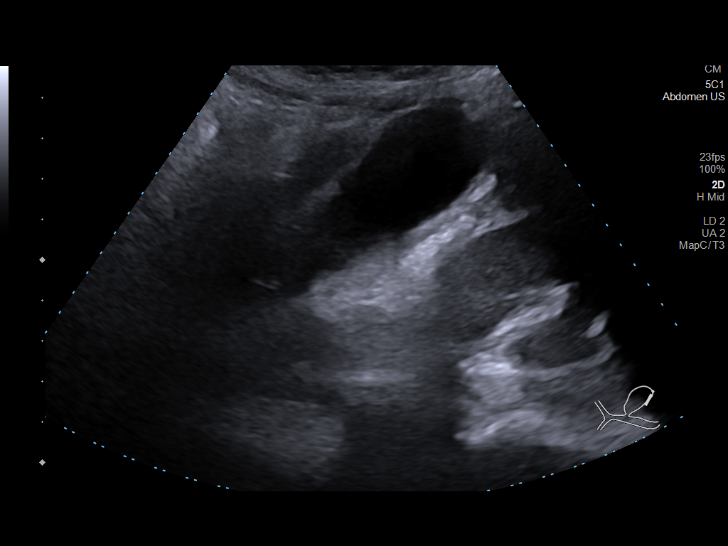
[im 23/62]
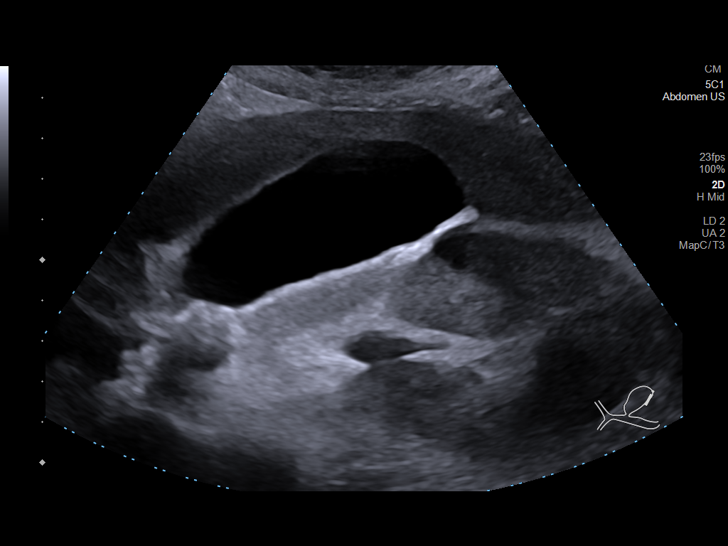
[im 28/62]
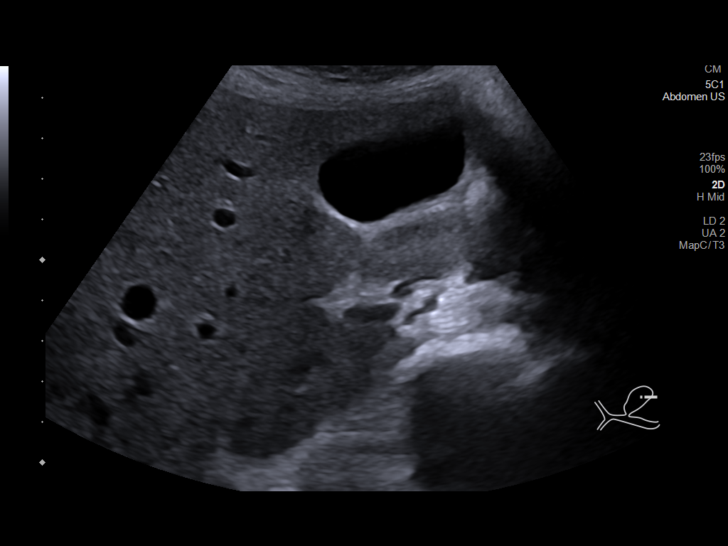
[im 34/62]
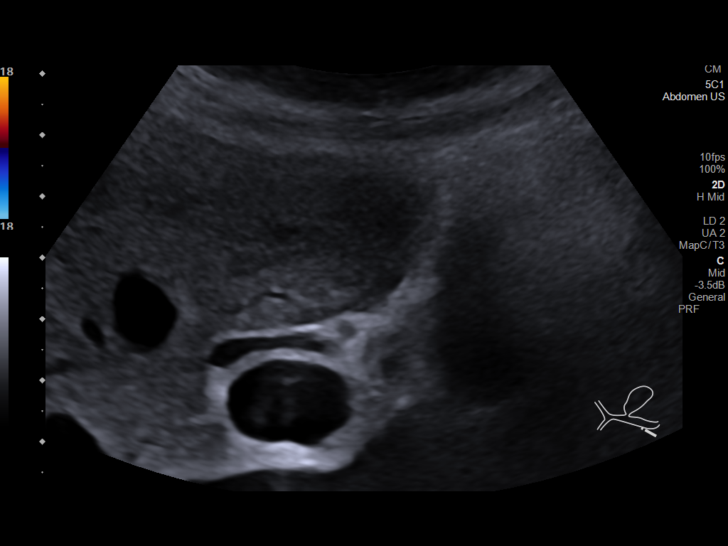
[im 39/62]
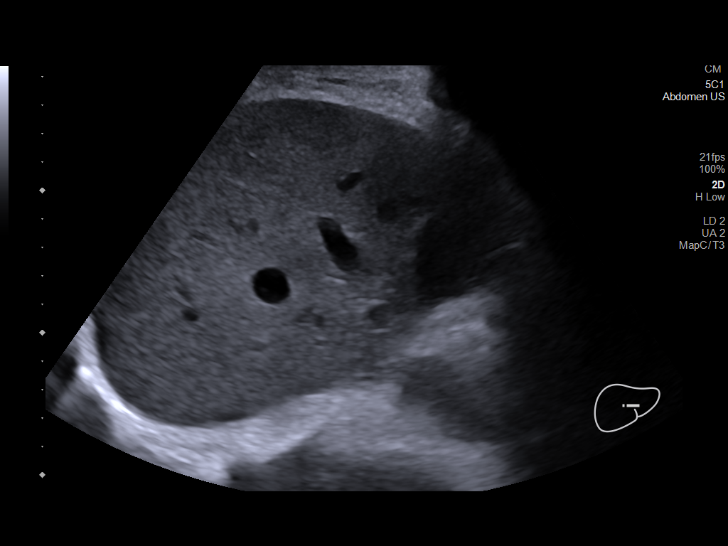
[im 41/62]
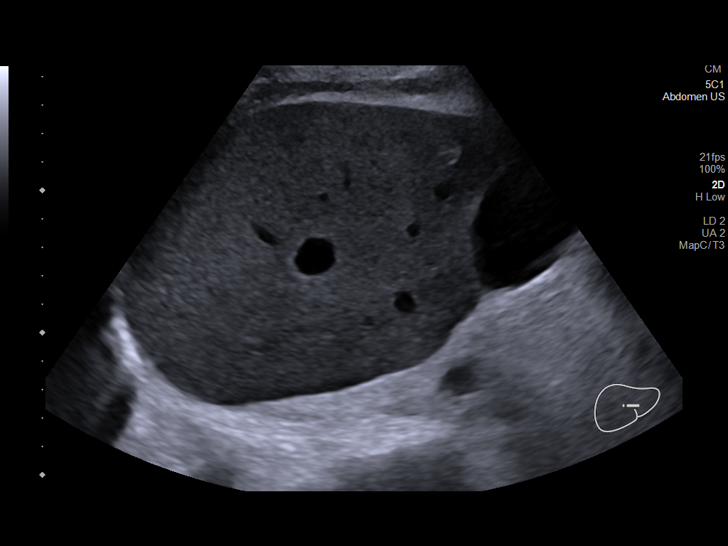
[im 46/62]
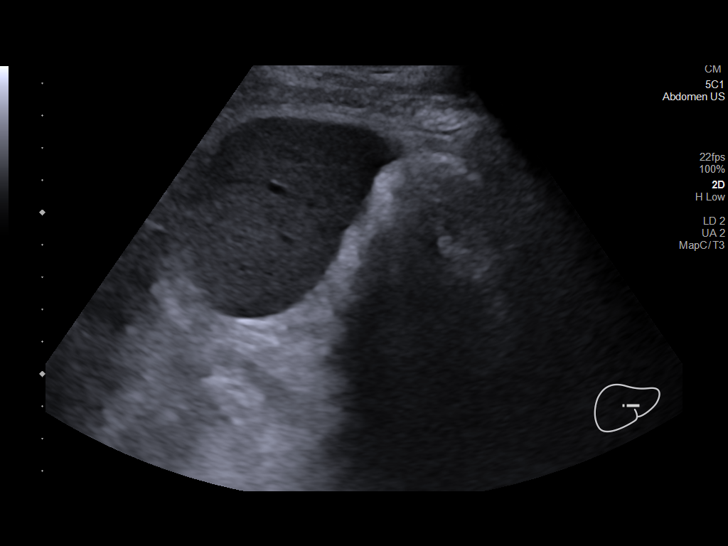
[im 51/62]
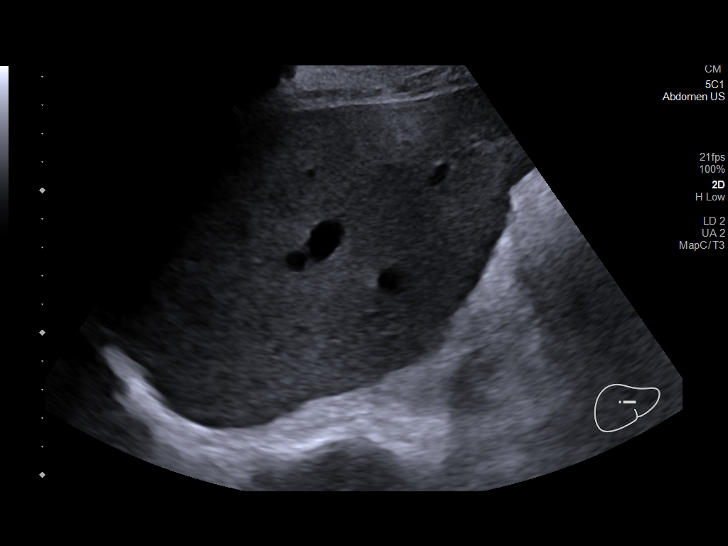
[im 56/62]
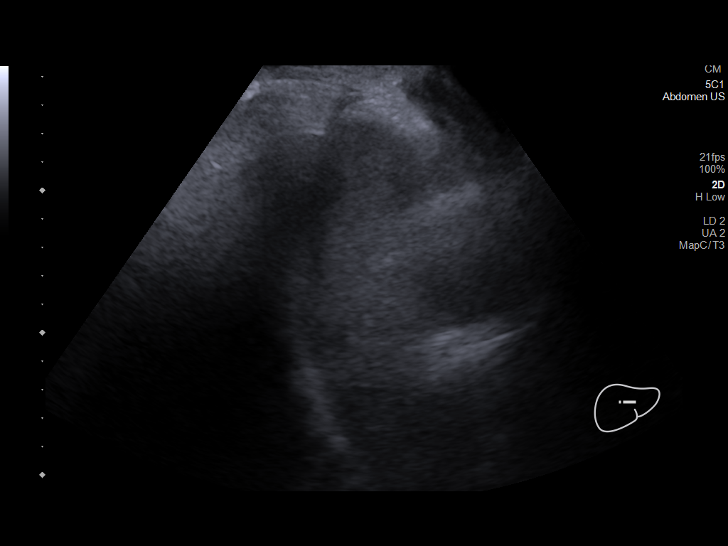
[im 62/62]
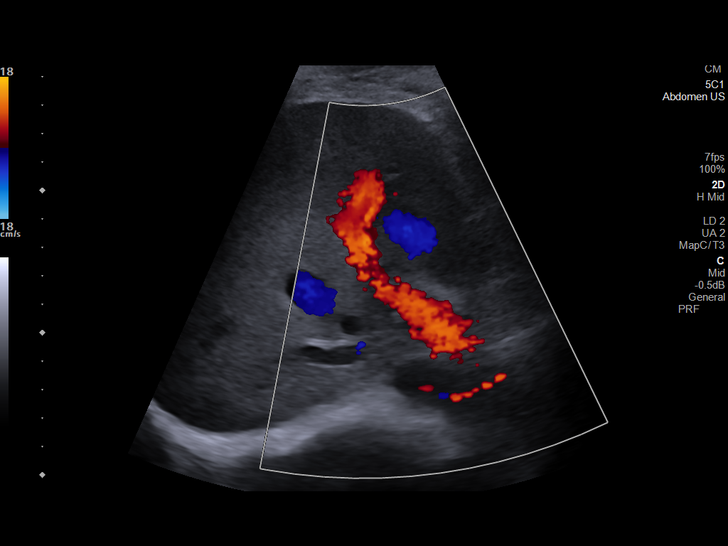

[14 of 25 positions shown; findings below may reference images not displayed]

FINDINGS: Gallbladder:

Cholelithiasis is identified without wall thickening,
pericholecystic fluid, or Murphy's sign.

Common bile duct:

Diameter: 2.2 mm

Liver:

Slight increased echogenicity with no focal mass. Portal vein is
patent on color Doppler imaging with normal direction of blood flow
towards the liver.

Other: None.
IMPRESSION: 1. Cholelithiasis identified. The gallbladder is otherwise normal in
appearance.
2. Slight increased echogenicity in the liver is nonspecific but
often due to hepatic steatosis.

## 2023-08-06 DIAGNOSIS — F331 Major depressive disorder, recurrent, moderate: Secondary | ICD-10-CM | POA: Diagnosis not present

## 2023-08-06 DIAGNOSIS — F418 Other specified anxiety disorders: Secondary | ICD-10-CM | POA: Diagnosis not present

## 2023-08-21 DIAGNOSIS — M79672 Pain in left foot: Secondary | ICD-10-CM | POA: Diagnosis not present

## 2023-08-21 DIAGNOSIS — L565 Disseminated superficial actinic porokeratosis (DSAP): Secondary | ICD-10-CM | POA: Diagnosis not present

## 2023-08-21 DIAGNOSIS — M79675 Pain in left toe(s): Secondary | ICD-10-CM | POA: Diagnosis not present

## 2023-08-21 DIAGNOSIS — L110 Acquired keratosis follicularis: Secondary | ICD-10-CM | POA: Diagnosis not present

## 2023-08-21 DIAGNOSIS — M779 Enthesopathy, unspecified: Secondary | ICD-10-CM | POA: Diagnosis not present

## 2023-08-23 DIAGNOSIS — M5416 Radiculopathy, lumbar region: Secondary | ICD-10-CM | POA: Diagnosis not present

## 2023-08-23 DIAGNOSIS — Z5181 Encounter for therapeutic drug level monitoring: Secondary | ICD-10-CM | POA: Diagnosis not present

## 2023-08-23 DIAGNOSIS — M5412 Radiculopathy, cervical region: Secondary | ICD-10-CM | POA: Diagnosis not present

## 2023-08-23 DIAGNOSIS — G894 Chronic pain syndrome: Secondary | ICD-10-CM | POA: Diagnosis not present

## 2023-08-23 DIAGNOSIS — M549 Dorsalgia, unspecified: Secondary | ICD-10-CM | POA: Diagnosis not present

## 2023-08-23 DIAGNOSIS — D696 Thrombocytopenia, unspecified: Secondary | ICD-10-CM | POA: Diagnosis not present

## 2023-08-23 DIAGNOSIS — M533 Sacrococcygeal disorders, not elsewhere classified: Secondary | ICD-10-CM | POA: Diagnosis not present

## 2023-08-23 DIAGNOSIS — M5417 Radiculopathy, lumbosacral region: Secondary | ICD-10-CM | POA: Diagnosis not present

## 2023-08-23 DIAGNOSIS — M47812 Spondylosis without myelopathy or radiculopathy, cervical region: Secondary | ICD-10-CM | POA: Diagnosis not present

## 2023-08-23 DIAGNOSIS — M542 Cervicalgia: Secondary | ICD-10-CM | POA: Diagnosis not present

## 2023-10-01 DIAGNOSIS — F418 Other specified anxiety disorders: Secondary | ICD-10-CM | POA: Diagnosis not present

## 2023-10-01 DIAGNOSIS — F331 Major depressive disorder, recurrent, moderate: Secondary | ICD-10-CM | POA: Diagnosis not present

## 2023-10-02 DIAGNOSIS — J439 Emphysema, unspecified: Secondary | ICD-10-CM | POA: Diagnosis not present

## 2023-10-02 DIAGNOSIS — F419 Anxiety disorder, unspecified: Secondary | ICD-10-CM | POA: Diagnosis not present

## 2023-10-02 DIAGNOSIS — G894 Chronic pain syndrome: Secondary | ICD-10-CM | POA: Diagnosis not present

## 2023-10-02 DIAGNOSIS — M48062 Spinal stenosis, lumbar region with neurogenic claudication: Secondary | ICD-10-CM | POA: Diagnosis not present

## 2023-10-15 DIAGNOSIS — M47812 Spondylosis without myelopathy or radiculopathy, cervical region: Secondary | ICD-10-CM | POA: Diagnosis not present

## 2023-10-17 ENCOUNTER — Encounter: Payer: Self-pay | Admitting: Internal Medicine

## 2023-10-26 DIAGNOSIS — F418 Other specified anxiety disorders: Secondary | ICD-10-CM | POA: Diagnosis not present

## 2023-10-26 DIAGNOSIS — F331 Major depressive disorder, recurrent, moderate: Secondary | ICD-10-CM | POA: Diagnosis not present

## 2023-10-31 ENCOUNTER — Encounter: Payer: Self-pay | Admitting: Internal Medicine

## 2023-11-08 ENCOUNTER — Telehealth: Payer: Self-pay | Admitting: *Deleted

## 2023-11-08 NOTE — Telephone Encounter (Signed)
 Pt called in as he receive letter due for recall MRI. Also looks like he is due for OV. Antony Baumgartner, do you want to see him 1st prior to ordering?

## 2023-11-14 DIAGNOSIS — G894 Chronic pain syndrome: Secondary | ICD-10-CM | POA: Diagnosis not present

## 2023-11-14 DIAGNOSIS — Z5181 Encounter for therapeutic drug level monitoring: Secondary | ICD-10-CM | POA: Diagnosis not present

## 2023-11-14 DIAGNOSIS — M533 Sacrococcygeal disorders, not elsewhere classified: Secondary | ICD-10-CM | POA: Diagnosis not present

## 2023-11-14 DIAGNOSIS — M549 Dorsalgia, unspecified: Secondary | ICD-10-CM | POA: Diagnosis not present

## 2023-11-14 DIAGNOSIS — M5417 Radiculopathy, lumbosacral region: Secondary | ICD-10-CM | POA: Diagnosis not present

## 2023-11-14 DIAGNOSIS — M5416 Radiculopathy, lumbar region: Secondary | ICD-10-CM | POA: Diagnosis not present

## 2023-11-14 DIAGNOSIS — M5412 Radiculopathy, cervical region: Secondary | ICD-10-CM | POA: Diagnosis not present

## 2023-11-14 DIAGNOSIS — D696 Thrombocytopenia, unspecified: Secondary | ICD-10-CM | POA: Diagnosis not present

## 2023-11-14 DIAGNOSIS — M47812 Spondylosis without myelopathy or radiculopathy, cervical region: Secondary | ICD-10-CM | POA: Diagnosis not present

## 2023-11-14 DIAGNOSIS — M542 Cervicalgia: Secondary | ICD-10-CM | POA: Diagnosis not present

## 2023-11-15 DIAGNOSIS — Z9229 Personal history of other drug therapy: Secondary | ICD-10-CM | POA: Diagnosis not present

## 2023-11-15 DIAGNOSIS — F419 Anxiety disorder, unspecified: Secondary | ICD-10-CM | POA: Diagnosis not present

## 2023-11-20 ENCOUNTER — Other Ambulatory Visit: Payer: Self-pay | Admitting: *Deleted

## 2023-11-20 ENCOUNTER — Telehealth: Payer: Self-pay | Admitting: *Deleted

## 2023-11-20 DIAGNOSIS — D490 Neoplasm of unspecified behavior of digestive system: Secondary | ICD-10-CM

## 2023-11-20 NOTE — Telephone Encounter (Signed)
 PA approved via [employer] [id number] DOS: 11/20/23-05/18/24

## 2023-11-28 ENCOUNTER — Ambulatory Visit (HOSPITAL_COMMUNITY)
Admission: RE | Admit: 2023-11-28 | Discharge: 2023-11-28 | Disposition: A | Discharge status: Home / Self Care | Source: Ambulatory Visit | Attending: Gastroenterology | Admitting: Gastroenterology

## 2023-11-28 ENCOUNTER — Other Ambulatory Visit: Payer: Self-pay | Admitting: Gastroenterology

## 2023-11-28 DIAGNOSIS — K8020 Calculus of gallbladder without cholecystitis without obstruction: Secondary | ICD-10-CM | POA: Diagnosis not present

## 2023-11-28 DIAGNOSIS — D490 Neoplasm of unspecified behavior of digestive system: Secondary | ICD-10-CM | POA: Insufficient documentation

## 2023-11-28 DIAGNOSIS — R161 Splenomegaly, not elsewhere classified: Secondary | ICD-10-CM | POA: Diagnosis not present

## 2023-11-28 MED ORDER — GADOBUTROL 1 MMOL/ML IV SOLN
10.0000 mL | Freq: Once | INTRAVENOUS | Status: AC | PRN
Start: 2023-11-28 — End: 2023-11-28
  Administered 2023-11-28: 10 mL via INTRAVENOUS

## 2023-12-04 ENCOUNTER — Ambulatory Visit: Payer: Self-pay | Admitting: Gastroenterology

## 2023-12-13 ENCOUNTER — Encounter: Payer: Self-pay | Admitting: *Deleted

## 2023-12-13 NOTE — Progress Notes (Unsigned)
 Jerome [name]

## 2023-12-17 NOTE — Progress Notes (Unsigned)
 PATIENT: Jerome Ore. DOB: 11-08-1960  REASON FOR VISIT: follow up HISTORY FROM: patient  Virtual Visit via Video Note  I connected with Jerome Ore. on 12/18/23 at   1:45 PM EDT by a video enabled telemedicine application located remotely at [facility] and verified that I am speaking with the correct person using two identifiers who was located at their own home in [place].    I discussed the limitations of evaluation and management by telemedicine and the availability of in person appointments. The patient expressed understanding and agreed to proceed.   PATIENT: Jerome Ore. DOB: [birth year]  REASON FOR VISIT: follow up HISTORY FROM: patient  HISTORY OF PRESENT ILLNESS: Today 12/18/23   Jerome Ore. is a 63 y.o. male with a history of OSA on CPAP. Returns today for follow-up.  He reports that he has been trying to be consistent with the CPAP.  Some nights he does not sleep as well.  He does state that he had 2 nights that he was unable to use it because his mask broke.  He does get all of his supplies off of [employer].  Does report 20 pound weight gain over the last year.  Does not have any trouble breathing in or out with his mask.  Does not feel the mask leaking.    He does report that he has been waking up with migraine headaches.  He states that this is a new symptom for him.  In the past he has had occipital neuralgia and Dr. Albertina Hugger did injections and then he reports he had an ablation of the nerve.  He states that these headaches are different.  Reports that they are in the frontal region reports photophobia denies phonophobia reports nausea at times.  He continues to see [facility] for pain management.       12/19/22:Jerome L Eustacio Ellen. is a 63 y.o. male with a history of OSA on CPAP. Returns today for follow-up. Reports that CPAP is working well. Download shows that he uses his machine 27 out of 30 days for compliance of 90%.  He  uses machine greater than 4 hours for compliance of 60%.  On average he uses his machine 5 hours and 29 minutes.  His residual AHI is 7.4 on 6-16 cmH2O with EPR 3.  He reports that he sometimes has trouble sleeping due to pain.  He returns today for virtual visit    12/20/21:Jerome [name] is a 63 year old male with a history of OSA on CPAP. He returns today for follow-up. CPAP reports shows that he used machine 26/30 days. Used >4 hours for compliance of 80 %. Average usage is 5 hours and 45 minutes.  Residual AHI is 6.7 on 6 to 16 cm of water  with EPR 3 . missed some days d/t sleeping in the recliner d/t sciatic nerve pain. Otherwise no new issues with CPAP.  12/21/20:Jerome [name] is a 63 year old male with a history of OSA on CPAP. He joins me today for a video visit. His download indicates that he uses his machine 28/30 for compliance of 93.3%. he used his machine > 4 hours for compliance of 86.7%. On average he uses his machine 5 hours 43 mins.  His residual AHI is 5.3 on 6 to 16 cm of water  with EPR of 3.  He reports that the CPAP is working well for him.  He denies any new issues.  06/22/20: Jerome [name] is a 63 year old male with a history of OSA on CPAP.  His download indicates that he uses his machine 29 out of 30 days for compliance of 93.5%.  He uses machine greater than 4 hours for compliance of 83.9%.  On average he uses his machine 6 hours and 26 minutes.  His residual AHI is 5.5 on 6 to 16 cm of water  with EPR 3.  He reports that the CPAP is working well for him.  He has noticed a significant difference in how he feels.  He is not happy with the full facemask.  He states that it makes the bridge of his nose very sore.  He is currently putting Band-Aids across his nose to add more comfort.  He returns today for virtual visit  HISTORY 06/10/20:   Jerome [name] is a 63 year old male with a history of obstructive sleep apnea on CPAP.  His download indicates that he uses machine 27 out of 30 days for  compliance of 90%.  He uses machine greater than 4 hours for compliance of 73.3%.  On average he uses his machine 6 hours and 50 minutes.  His residual AHI is 5.4 on 6 to 16 cm of water .  He reports that he has noticed the biggest change since he started using CPAP.  He is having a hard time using a full facemask.  He states that it rubs a spot on the top of his nose.  He returns today for follow-up.   He does note that he just got machine October 20 so we are not quite at 31 days yet for his initial CPAP visit.  REVIEW OF SYSTEMS: Out of a complete 14 system review of symptoms, the patient complains only of the following symptoms, and all other reviewed systems are negative.  See HPI  ALLERGIES: Allergies  Allergen Reactions   Aspirin Nausea And Vomiting    Due to gastric sleeve surgery    Codeine Nausea And Vomiting   Nsaids Anxiety    Due to gastric sleeve surgery  N/V    HOME MEDICATIONS: Outpatient Medications Prior to Visit  Medication Sig Dispense Refill   acetaminophen (TYLENOL) 500 MG tablet Take 1,000 mg by mouth 2 (two) times daily as needed for moderate pain or headache.      bisacodyl (DULCOLAX) 5 MG EC tablet Take 10 mg by mouth at bedtime.     Calcium  Carb-Cholecalciferol (CALCIUM /VITAMIN D PO) Take 1 tablet by mouth daily.     cetirizine (ZYRTEC) 10 MG tablet Take 10 mg by mouth daily as needed for allergies.     Cholecalciferol (VITAMIN D) 50 MCG (2000 UT) tablet Take 2,000 Units by mouth daily.     clonazePAM (KLONOPIN) 1 MG tablet Take 1 mg by mouth 2 (two) times daily.      cyclobenzaprine  (FLEXERIL ) 10 MG tablet Take 5 mg by mouth at bedtime as needed for muscle spasms.     docusate sodium (COLACE) 100 MG capsule Take 300 mg by mouth at bedtime.     Flaxseed, Linseed, (FLAX SEED OIL) 1000 MG CAPS Take 1,000 mg by mouth daily. With Fish Oil     HYDROcodone-acetaminophen (NORCO/VICODIN) 5-325 MG tablet Take 1 tablet by mouth every 6 (six) hours as needed for moderate  pain.     losartan  (COZAAR ) 25 MG tablet Take 0.5 tablets (12.5 mg total) by mouth daily. 45 tablet 3   lubiprostone  (AMITIZA ) 24 MCG capsule TAKE 1 CAPSULE BY MOUTH  TWICE A DAY WITH FOOD 180 capsule 1   Multiple Vitamin (MULTIVITAMIN WITH MINERALS) TABS tablet Take 1 tablet by mouth daily.     omeprazole (PRILOSEC) 20 MG capsule Take 20 mg by mouth at bedtime.     pravastatin (PRAVACHOL) 40 MG tablet Take 40 mg by mouth at bedtime.     sertraline (ZOLOFT) 100 MG tablet Take 100 mg by mouth daily.     No facility-administered medications prior to visit.    PAST MEDICAL HISTORY: Past Medical History:  Diagnosis Date   Anxiety    COPD (chronic obstructive pulmonary disease) (HCC)    DJD (degenerative joint disease)    Foot drop, left    H/O cardiovascular stress test 2013   H/O echocardiogram 2012   for cad, htn & obesity   History of cardiac catheterization    a. normal cors by cath in 2011   Hypertension    Spinal stenosis    Thrombocytopenia (HCC)     PAST SURGICAL HISTORY: Past Surgical History:  Procedure Laterality Date   BACK SURGERY     x 4   BIOPSY  05/27/2020   Procedure: BIOPSY;  Surgeon: Suzette Espy, MD;  Location: [facility];  Service: Endoscopy;;   BREAST REDUCTION SURGERY     CARDIAC CATHETERIZATION  2011   5 frech sheath   CERVICAL SPINE SURGERY     COLONOSCOPY N/A 10/14/2012   NFA:OZHYQM rectum and colon   COLONOSCOPY WITH PROPOFOL  N/A 05/27/2020   normal   CYST EXCISION Right 08/08/2016   Procedure: EXCISION OF RIGHT INGUINAL SUBCUTANEOUS MASS (LIKELY SEBACEIYS CYST);  Surgeon: Franki Isles, MD;  Location: [facility];  Service: General;  Laterality: Right;  7cm x 4cm mass; dermabond dressing   ESOPHAGEAL BRUSHING  05/24/2022   Procedure: ESOPHAGEAL BRUSHING;  Surgeon: Suzette Espy, MD;  Location: [facility];  Service: Endoscopy;;   ESOPHAGOGASTRODUODENOSCOPY (EGD) WITH PROPOFOL  N/A 05/27/2020   Grade 1-2 esophageal varices, possible  Barrett's s/p biopsy (negative Barretts), portal gastropathy. Gastropathy could be source of occult GI bleeding. Negative H.pylori.   ESOPHAGOGASTRODUODENOSCOPY (EGD) WITH PROPOFOL  N/A 05/24/2022   Procedure: ESOPHAGOGASTRODUODENOSCOPY (EGD) WITH PROPOFOL ;  Surgeon: Suzette Espy, MD;  [facility];  Service: Endoscopy;  Laterality: N/A;  9:00am, asa 3   FLEXIBLE SIGMOIDOSCOPY N/A 10/15/2013   markedly inflamed distal rectum with ulceration, rectal polyp benign. Acute ulceration of rectum.    GIVENS CAPSULE STUDY N/A 01/12/2021   Procedure: GIVENS CAPSULE STUDY;  Surgeon: Suzette Espy, MD;  [facility];  Service: Endoscopy;  Laterality: N/A;  8:30am   h/o cardiac cath     LAPAROSCOPIC GASTRIC SLEEVE RESECTION  06/2012   Dr Rozetta Corns [facility])   LUMBAR DISC SURGERY     x2    FAMILY HISTORY: Family History  Problem Relation Age of Onset   Heart attack Father    Colon cancer Paternal Grandmother    Brain cancer Brother 45    SOCIAL HISTORY: 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[place])  Tobacco Use   Smoking status: Every Day    Current packs/day: 0.25    Average packs/day: 0.3 packs/day for 36.0 years (9.0 ttl pk-yrs)    Types:  Cigarettes   Smokeless tobacco: Never  Vaping Use   Vaping status: Never Used  Substance and Sexual Activity   Alcohol use: Yes    Alcohol/week: 0.0 standard drinks of alcohol    Comment: occasionally    Drug use: No   Sexual activity: Yes    Partners: Male  Other Topics Concern   Not on file  Social History Narrative   Moving to [place] to care for brother w/ brain tumor   Lives w/ 2 daughters & 4 grandsons   Social Drivers of Health   Financial Resource Strain: Medium Risk (10/25/2023)   Received from  [facility]   Overall Financial Resource Strain (CARDIA)    Difficulty of Paying Living Expenses: Somewhat hard  Food Insecurity: Food Insecurity Present (10/25/2023)   Received from [facility]   Hunger Vital Sign    Worried About Running Out of Food in the Last Year: Sometimes true    Ran Out of Food in the Last Year: Sometimes true  Transportation Needs: No Transportation Needs (10/25/2023)   Received from [facility] - Transportation    In the past 12 months, has lack of transportation kept you from medical appointments or from getting medications?: No    Lack of Transportation (Non-Medical): No  Physical Activity: Not on file  Stress: Not on file  Social Connections: Not on file  Intimate Partner Violence: Not on file      PHYSICAL EXAM Generalized: Well developed, in no acute distress   Neurological examination  Mentation: Alert oriented to time, place, history taking. Follows all commands speech and language fluent Cranial nerve II-XII:. Facial symmetry noted.    DIAGNOSTIC DATA (LABS, IMAGING, TESTING) - I reviewed patient records, labs, notes, testing and imaging myself where available.  Lab Results  Component Value Date   WBC 5.3 05/22/2022   HGB 15.1 05/22/2022   HCT 43.8 05/22/2022   MCV 93.6 05/22/2022   PLT 125 (L) 05/22/2022      Component Value Date/Time   NA 140 05/17/2023 0845   K 4.4 05/17/2023 0845   CL 103 05/17/2023 0845   CO2 23 05/17/2023 0845   GLUCOSE 88 05/17/2023 0845   GLUCOSE 79 11/16/2020 1405   BUN 14 05/17/2023 0845   CREATININE 0.87 05/17/2023 0845   CREATININE 0.84 11/16/2020 1405   CALCIUM  9.8 05/17/2023 0845   PROT 6.8 05/17/2023 0845   ALBUMIN 4.7 05/17/2023 0845   AST 17 05/17/2023 0845   ALT 11 05/17/2023 0845   ALKPHOS 63 05/17/2023 0845   BILITOT 0.6 05/17/2023 0845   GFRNONAA 97 11/16/2020 1405   GFRAA 112 11/16/2020 1405    Lab Results  Component Value  Date   TSH 1.20 11/29/2020      ASSESSMENT AND PLAN 63 y.o. year old male  has a past medical history of Anxiety, COPD (chronic obstructive pulmonary disease) (HCC), DJD (degenerative joint disease), Foot drop, left, H/O cardiovascular stress test (2013), H/O echocardiogram (2012), History of cardiac catheterization, Hypertension, Spinal stenosis, and Thrombocytopenia (HCC). here with:  OSA on CPAP  CPAP compliance suboptimal Residual AHI is slightly elevated at 13.  I consulted with the sleep lab and we will decrease a flex setting to 1 to see if this is helpful. Patient reporting new symptom-migraine headache-advised patient that we would get him back in for an in office visit to discuss new symptom.  He verbalized understanding.  He will come in tomorrow for  a visit with Dr. Albertina Hugger to discuss new symptoms. Encouraged patient to continue using CPAP nightly and > 4 hours each night F/U in 1 year or sooner if needed  Clem Currier, MSN, NP-C 12/17/2023, 8:36 AM [facility] [address] [place], [place] [address] [phone]

## 2023-12-18 ENCOUNTER — Telehealth: Payer: Self-pay

## 2023-12-18 ENCOUNTER — Telehealth (INDEPENDENT_AMBULATORY_CARE_PROVIDER_SITE_OTHER): Payer: Medicare HMO | Admitting: Adult Health

## 2023-12-18 DIAGNOSIS — G4733 Obstructive sleep apnea (adult) (pediatric): Secondary | ICD-10-CM

## 2023-12-18 NOTE — Telephone Encounter (Signed)
 Pt had VV with Jerome Matas, NP today. New problem mentioned of migraine. Megan requested a visit with Dr. Albertina Hugger as she has seen Pt in past for migraine. Cld Pt to offer appt on 12/19/23 at 3:30 pm. No answer, LVM for call back.

## 2023-12-18 NOTE — Telephone Encounter (Signed)
 Pt has called and accepted the appointment for 5-28

## 2023-12-18 NOTE — Patient Instructions (Signed)
 Your Plan:  Continue using CPAP nightly and greater than 4 hours each night We will make some adjustments to your settings. Keep appointment tomorrow with Dr. Albertina Hugger to discuss headaches If your symptoms worsen or you develop new symptoms please let us  know.       Thank you for coming to see us  at [facility]. I hope we have been able to provide you high quality care today.  You may receive a patient satisfaction survey over the next few weeks. We would appreciate your feedback and comments so that we may continue to improve ourselves and the health of our patients.

## 2023-12-19 ENCOUNTER — Encounter: Payer: Self-pay | Admitting: Neurology

## 2023-12-19 ENCOUNTER — Ambulatory Visit: Admitting: Neurology

## 2023-12-19 VITALS — BP 139/102 | HR 72 | Ht 71.0 in | Wt 238.6 lb

## 2023-12-19 DIAGNOSIS — R110 Nausea: Secondary | ICD-10-CM | POA: Insufficient documentation

## 2023-12-19 DIAGNOSIS — G4710 Hypersomnia, unspecified: Secondary | ICD-10-CM

## 2023-12-19 DIAGNOSIS — R519 Headache, unspecified: Secondary | ICD-10-CM | POA: Diagnosis not present

## 2023-12-19 DIAGNOSIS — Z9884 Bariatric surgery status: Secondary | ICD-10-CM

## 2023-12-19 DIAGNOSIS — H53143 Visual discomfort, bilateral: Secondary | ICD-10-CM | POA: Diagnosis not present

## 2023-12-19 DIAGNOSIS — Z79891 Long term (current) use of opiate analgesic: Secondary | ICD-10-CM

## 2023-12-19 DIAGNOSIS — G4731 Primary central sleep apnea: Secondary | ICD-10-CM

## 2023-12-19 DIAGNOSIS — G4730 Sleep apnea, unspecified: Secondary | ICD-10-CM | POA: Diagnosis not present

## 2023-12-19 DIAGNOSIS — G43711 Chronic migraine without aura, intractable, with status migrainosus: Secondary | ICD-10-CM | POA: Insufficient documentation

## 2023-12-19 MED ORDER — PROPRANOLOL HCL ER 60 MG PO CP24: 60.0000 mg | ORAL_CAPSULE | Freq: Every day | ORAL | 3 refills | Status: DC

## 2023-12-19 MED ORDER — NURTEC 75 MG PO TBDP: ORAL_TABLET | ORAL | 3 refills | Status: DC

## 2023-12-19 NOTE — Patient Instructions (Signed)
 Chronic Migraine Headache A migraine headache is throbbing pain that is usually on one side of the head. It can also cause other symptoms. A migraine is called a chronic migraine if it happens at least 15 days in a month for more than 3 months. Talk with your doctor about what things may bring on (trigger) your migraines. What are the causes? The exact cause of a migraine is not known. This condition may be brought on or caused by: Smoking. Some medicines, such as birth control or some blood pressure medicines. Certain substances in some foods or drinks. Foods and drinks, such as: Cheese. Chocolate. Alcohol. Caffeine. Other things that may bring on a migraine include: Periods. Stress. Getting too much or too little sleep. Feeling tired (fatigue). Bright lights or loud noises. Certain smells. Weather changes and being at high altitude. What increases the risk? The following factors may make you more likely to have chronic migraines: Having migraines or family members who have them. Having a mental health condition, such as being sad (depressed) or feeling worried or nervous (anxious). Taking a lot of pain medicine. Having sleep problems. Having heart disease, diabetes, or being very overweight (obese). What are the signs or symptoms? Symptoms vary for each person. The pain may: Feel like it is pulsing or throbbing. Happen only on one side of the head. In some cases, the pain may be on both sides of the head or around the head or neck. Be so bad that it keeps you from doing daily activities. Get worse with activity. Make you feel like you may vomit (feel nauseous) or vomit. Get worse around bright lights, loud noises, or smells. Cause you to feel dizzy. A sign that you may have chronic migraines is when you start to have more and more migraines. How is this treated? This condition is treated with medicines that: Lessen pain and the feeling like you may vomit. Prevent  migraines. Treatment may also include: Acupuncture. Changes to your diet or sleep. Relaxation training. Learning ways to control your body and breathing (biofeedback). Talk therapy to help you know and deal with negative thoughts (cognitive behavioral therapy). Using a device that gives electrical stimulation to your nerves, which can help take away pain. A shot of medicine into the muscles of the face or head. Surgery, if the other treatments do not work. Follow these instructions at home: Medicines Take over-the-counter and prescription medicines only as told by your doctor. Ask your doctor if the medicine prescribed to you requires you to avoid driving or using machinery. Lifestyle  Do not drink alcohol. Do not smoke or use any products that contain nicotine or tobacco. If you need help quitting, ask your doctor. Get 7-9 hours of sleep every night, or the amount of sleep recommended by your doctor. Lower the stress in your life. Ask your doctor about ways to do this. Stay at a healthy weight. Talk with your doctor if you need help losing weight. Get regular exercise. General instructions Keep a journal to find out if certain things bring on migraines. This can help you avoid those things. For example, write down: What you eat and drink. How much sleep you get. Any change to your diet or medicines. Lie down in a dark, quiet room when you have a migraine. Try placing a cool towel over your head when you have a migraine. Keep lights dim if bright lights bother you or make your migraines worse. Where to find more information Coalition for Headache and  Migraine Patients (CHAMP): headachemigraine.org American Migraine Foundation: americanmigrainefoundation.org National Headache Foundation: headaches.org Contact a doctor if: Medicine does not help your migraine. Your pain keeps coming back even with medicine. Get help right away if: Your migraine becomes really bad and medicine does  not help. You have a fever or stiff neck. You have trouble seeing. Your muscles are weak or you lose control of them. You lose your balance or have trouble walking. You feel like you may faint or you faint. You start having sudden, very bad headaches. You have a seizure. This information is not intended to replace advice given to you by your health care provider. Make sure you discuss any questions you have with your health care provider. Document Revised: 03/06/2022 Document Reviewed: 03/06/2022 Elsevier Patient Education  2024 [employer].
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
## 2023-12-20 ENCOUNTER — Other Ambulatory Visit (HOSPITAL_COMMUNITY): Payer: Self-pay

## 2023-12-20 ENCOUNTER — Telehealth: Payer: Self-pay

## 2023-12-20 DIAGNOSIS — G43711 Chronic migraine without aura, intractable, with status migrainosus: Secondary | ICD-10-CM

## 2023-12-20 MED ORDER — NURTEC 75 MG PO TBDP
75.0000 mg | ORAL_TABLET | ORAL | 5 refills | Status: DC | PRN
Start: 2023-12-20 — End: 2024-01-03

## 2023-12-20 NOTE — Telephone Encounter (Signed)
 Dr. Albertina Hugger- do you want him to take as an abortive or prevention treatment? Cannot do both. It is not clear from note how you want him to take this.

## 2023-12-20 NOTE — Telephone Encounter (Signed)
 I received a request via CMM to complete PA for Nurtec-in chart I see written for both prn and preventative- please clarify RX directions so we can submit the PA correctly. Thanks!

## 2023-12-20 NOTE — Telephone Encounter (Signed)
 Spoke with Dr. Albertina Hugger. Propranolol  prescribed as a preventative. She wants Nurtec to be abortive therapy option.

## 2023-12-20 NOTE — Telephone Encounter (Signed)
 Monica, this has been update, thank you

## 2023-12-21 ENCOUNTER — Telehealth: Payer: Self-pay | Admitting: Neurology

## 2023-12-21 ENCOUNTER — Other Ambulatory Visit (HOSPITAL_COMMUNITY): Payer: Self-pay

## 2023-12-21 NOTE — Telephone Encounter (Signed)
 Pharmacy Patient Advocate Encounter  Received notification from [facility] that Prior Authorization for Nurtec 75MG  dispersible tablets has been APPROVED from 12/20/2023 to 07/23/2024. Ran test claim, Copay is $702.56. This test claim was processed through [facility]- copay amounts may vary at other pharmacies due to pharmacy/plan contracts, or as the patient moves through the different stages of their insurance plan.   PA #/Case ID/Reference #: PA Case ID #: [id number] Pharmacy Patient Advocate Encounter   Patient may be eligible for a Medicare prescription Payment plan. The patient will need to reach out to their insurance company to enroll in the payment plan to spread out their payments throughout the year, If available. This test claim was processed through [facility]- copay amounts may vary at other pharmacies due to pharmacy/plan contracts, or as the patient moves through the different stages of their insurance plan.

## 2023-12-21 NOTE — Telephone Encounter (Signed)
 sent to GI they obtain [facility] Berkley Harvey [phone]

## 2023-12-21 NOTE — Telephone Encounter (Signed)
 Pharmacy Patient Advocate Encounter   Received notification from CoverMyMeds that prior authorization for Nurtec 75MG  dispersible tablets is required/requested.   Insurance verification completed.   The patient is insured through [facility] .   Per test claim: PA required; PA submitted to above mentioned insurance via CoverMyMeds Key/confirmation #/EOC [id number] Status is pending

## 2024-01-01 ENCOUNTER — Other Ambulatory Visit: Payer: Self-pay | Admitting: Internal Medicine

## 2024-01-03 ENCOUNTER — Ambulatory Visit: Attending: Internal Medicine | Admitting: Internal Medicine

## 2024-01-03 ENCOUNTER — Encounter: Payer: Self-pay | Admitting: Internal Medicine

## 2024-01-03 VITALS — BP 132/98 | HR 77 | Ht 72.0 in | Wt 242.8 lb

## 2024-01-03 DIAGNOSIS — R002 Palpitations: Secondary | ICD-10-CM

## 2024-01-03 NOTE — Patient Instructions (Signed)

## 2024-01-03 NOTE — Progress Notes (Signed)
 Cardiology Office Note  Date: 01/03/2024   ID: Jerome [name]., DOB September 24, 1960, MRN [id number]  PCP:  Kathyleen Parkins, MD  Cardiologist:  Lasalle Pointer, MD Electrophysiologist:  None   Reason for Office Visit: Follow-up of palpitations and dizziness   History of Present Illness: Jerome [name]. is a 63 y.o. male known to have HTN, OSA on CPAP, left lower extremity varicose veins s/p GSV ablation and stab phlebectomies presented to the cardiology clinic for follow-up visit.  Patient was initially referred to cardiology clinic for evaluation of palpitations and dizziness. Echo was performed in 2020/22 at that time which showed normal LVEF and no valvular abnormalities. EGD in 05/2022 showed erosive gastropathy is likely secondary to portal hypertension and esophageal varices.  LHC in 2013 showed angiographically normal coronaries.  NST in 2016 showed no evidence of ischemia.  Patient tearful during interview today. His 3 grandkids moved into his house and his daughter is staying somewhere else with someone.  He is tearful that he is not able to pay co-pay today.  He has anxiety, has chest pains and palpitations with anxiety.  He also has palpitations almost daily due to stress.  EKG today showed NSR, no arrhythmias.  He has OSA on CPAP, his settings were increased recently due to increased apneas on CPAP.  No syncope.   Past Medical History:  Diagnosis Date   Anxiety    COPD (chronic obstructive pulmonary disease) (HCC)    DJD (degenerative joint disease)    Foot drop, left    H/O cardiovascular stress test 2013   H/O echocardiogram 2012   for cad, htn & obesity   History of cardiac catheterization    a. normal cors by cath in 2011   Hypertension    Spinal stenosis    Thrombocytopenia [facility])     Past Surgical History:  Procedure Laterality Date   BACK SURGERY     x 4   BIOPSY  05/27/2020   Procedure: BIOPSY;  Surgeon: Suzette Espy, MD;  Location: [facility];  Service: Endoscopy;;   BREAST REDUCTION SURGERY     CARDIAC CATHETERIZATION  2011   5 frech sheath   CERVICAL SPINE SURGERY     COLONOSCOPY N/A 10/14/2012   [id number] rectum and colon   COLONOSCOPY WITH PROPOFOL  N/A 05/27/2020   normal   CYST EXCISION Right 08/08/2016   Procedure: EXCISION OF RIGHT INGUINAL SUBCUTANEOUS MASS (LIKELY SEBACEIYS CYST);  Surgeon: Franki Isles, MD;  Location: [facility];  Service: General;  Laterality: Right;  7cm x 4cm mass; dermabond dressing   ESOPHAGEAL BRUSHING  05/24/2022   Procedure: ESOPHAGEAL BRUSHING;  Surgeon: Suzette Espy, MD;  Location: [facility];  Service: Endoscopy;;   ESOPHAGOGASTRODUODENOSCOPY (EGD) WITH PROPOFOL  N/A 05/27/2020   Grade 1-2 esophageal varices, possible Barrett's s/p biopsy (negative Barretts), portal gastropathy. Gastropathy could be source of occult GI bleeding. Negative H.pylori.   ESOPHAGOGASTRODUODENOSCOPY (EGD) WITH PROPOFOL  N/A 05/24/2022   Procedure: ESOPHAGOGASTRODUODENOSCOPY (EGD) WITH PROPOFOL ;  Surgeon: Suzette Espy, MD;  Location: [facility];  Service: Endoscopy;  Laterality: N/A;  9:00am, asa 3   FLEXIBLE SIGMOIDOSCOPY N/A 10/15/2013   markedly inflamed distal rectum with ulceration, rectal polyp benign. Acute ulceration of rectum.    GIVENS CAPSULE STUDY N/A 01/12/2021   Procedure: GIVENS CAPSULE STUDY;  Surgeon: Suzette Espy, MD;  Location: [facility];  Service: Endoscopy;  Laterality: N/A;  8:30am   h/o cardiac cath  LAPAROSCOPIC GASTRIC SLEEVE RESECTION  06/2012   Dr Rozetta Corns [facility])   LUMBAR DISC SURGERY     x2    Current Outpatient Medications  Medication Sig Dispense Refill   acetaminophen (TYLENOL) 500 MG tablet Take 1,000 mg by mouth 2 (two) times daily as needed for moderate pain or headache.      bisacodyl (DULCOLAX) 5 MG EC tablet Take 10 mg by mouth at bedtime.     Calcium  Carb-Cholecalciferol (CALCIUM /VITAMIN D PO) Take 1 tablet by mouth daily.     cetirizine (ZYRTEC)  10 MG tablet Take 10 mg by mouth daily as needed for allergies.     Cholecalciferol (VITAMIN D) 50 MCG (2000 UT) tablet Take 2,000 Units by mouth daily.     clonazePAM (KLONOPIN) 1 MG tablet Take 1 mg by mouth 2 (two) times daily.      cyclobenzaprine  (FLEXERIL ) 10 MG tablet Take 5 mg by mouth at bedtime as needed for muscle spasms.     docusate sodium (COLACE) 100 MG capsule Take 300 mg by mouth at bedtime.     Flaxseed, Linseed, (FLAX SEED OIL) 1000 MG CAPS Take 1,000 mg by mouth daily. With Fish Oil     HYDROcodone-acetaminophen (NORCO/VICODIN) 5-325 MG tablet Take 1 tablet by mouth every 6 (six) hours as needed for moderate pain.     losartan  (COZAAR ) 25 MG tablet Take 0.5 tablets (12.5 mg total) by mouth daily. 45 tablet 3   lubiprostone  (AMITIZA ) 24 MCG capsule TAKE 1 CAPSULE BY MOUTH TWICE A DAY WITH FOOD 180 capsule 0   Multiple Vitamin (MULTIVITAMIN WITH MINERALS) TABS tablet Take 1 tablet by mouth daily.     omeprazole (PRILOSEC) 20 MG capsule Take 20 mg by mouth at bedtime.     pravastatin (PRAVACHOL) 40 MG tablet Take 40 mg by mouth at bedtime.     propranolol  ER (INDERAL  LA) 60 MG 24 hr capsule Take 1 capsule (60 mg total) by mouth at bedtime. 90 capsule 3   sertraline (ZOLOFT) 100 MG tablet Take 100 mg by mouth daily.     No current facility-administered medications for this visit.   Allergies:  Aspirin, Codeine, and Nsaids   Social History: The patient  reports that he has been smoking cigarettes. He has a 9 pack-year smoking history. He has never used smokeless tobacco. He reports current alcohol use. He reports that he does not use drugs.   Family History: The patient's family history includes Brain cancer (age of onset: 54) in his brother; Colon cancer in his paternal grandmother; Heart attack in his father.   ROS:  Please see the history of present illness. Otherwise, complete review of systems is positive for none.  All other systems are reviewed and negative.   Physical  Exam: VS:  BP (!) 132/98   Pulse 77   Ht 6' (1.829 m)   Wt 242 lb 12.8 oz (110.1 kg)   SpO2 98%   BMI 32.93 kg/m , BMI Body mass index is 32.93 kg/m.  Wt Readings from Last 3 Encounters:  01/03/24 242 lb 12.8 oz (110.1 kg)  12/19/23 238 lb 9.6 oz (108.2 kg)  05/08/23 209 lb 3.2 oz (94.9 kg)    General: Patient appears comfortable at rest. HEENT: Conjunctiva and lids normal, oropharynx clear with moist mucosa. Neck: Supple, no elevated JVP or carotid bruits, no thyromegaly. Lungs: Clear to auscultation, nonlabored breathing at rest. Cardiac: Regular rate and rhythm, no S3 or significant systolic murmur, no pericardial rub. Abdomen: Soft,  nontender, no hepatomegaly, bowel sounds present, no guarding or rebound. Extremities: No pitting edema, distal pulses 2+. Skin: Warm and dry. Musculoskeletal: No kyphosis. Neuropsychiatric: Alert and oriented x3,   ECG:  NSR and no ST-T changes  Recent Labwork: 05/17/2023: ALT 11; AST 17; BUN 14; Creatinine, Ser 0.87; Potassium 4.4; Sodium 140  No results found for: CHOL, TRIG, HDL, CHOLHDL, VLDL, LDLCALC, LDLDIRECT  Other Studies Reviewed Today: Echo from 2020 and 2022 LVEF preserved No valve abnormalities  LHC in 2013 Angiography normal coronaries Normal LVEDP  Assessment and Plan:  # Palpitations - Going through a lot of stress, tearful today during interview.  Palpitations are occurring daily now.  EKG today showed NSR, no arrhythmias.  If his palpitations get worse, can get event monitor in the future.  # Chest pain - Chest pains occur with anxiety and not necessarily with exertion.  LHC 2018 showed angiography normal coronaries and NM stress test in 2016 showed no evidence of ischemia.  Discussed symptoms of CAD and MI with the patient.  ER precautions for chest pain provided.  # HTN, controlled - Continue losartan  12.5 mg once daily, follows with PCP.  # OSA on CPAP - Follows with sleep medicine, continue  CPAP.  Settings were increased recently due to apneas while on CPAP.  # Nicotine abuse -Counseling.  Medication Adjustments/Labs and Tests Ordered: Current medicines are reviewed at length with the patient today.  Concerns regarding medicines are outlined above.   Tests Ordered: Orders Placed This Encounter  Procedures   EKG 12-Lead    Medication Changes: No orders of the defined types were placed in this encounter.   Disposition:  Follow up as needed  Signed Del Overfelt Beauford Bounds, MD, 01/03/2024 11:16 AM    [facility] [address] [place], [place], [place] [address]

## 2024-01-04 DIAGNOSIS — F331 Major depressive disorder, recurrent, moderate: Secondary | ICD-10-CM | POA: Diagnosis not present

## 2024-01-04 DIAGNOSIS — F418 Other specified anxiety disorders: Secondary | ICD-10-CM | POA: Diagnosis not present

## 2024-01-15 ENCOUNTER — Ambulatory Visit
Admission: RE | Admit: 2024-01-15 | Discharge: 2024-01-15 | Disposition: A | Discharge status: Home / Self Care | Source: Ambulatory Visit | Attending: Neurology | Admitting: Neurology

## 2024-01-15 DIAGNOSIS — G43711 Chronic migraine without aura, intractable, with status migrainosus: Secondary | ICD-10-CM | POA: Diagnosis not present

## 2024-01-15 DIAGNOSIS — G4731 Primary central sleep apnea: Secondary | ICD-10-CM | POA: Diagnosis not present

## 2024-01-15 DIAGNOSIS — Z9884 Bariatric surgery status: Secondary | ICD-10-CM

## 2024-01-15 DIAGNOSIS — G4710 Hypersomnia, unspecified: Secondary | ICD-10-CM

## 2024-01-15 DIAGNOSIS — R519 Headache, unspecified: Secondary | ICD-10-CM

## 2024-01-15 DIAGNOSIS — Z79891 Long term (current) use of opiate analgesic: Secondary | ICD-10-CM

## 2024-01-15 MED ORDER — GADOPICLENOL 0.5 MMOL/ML IV SOLN
10.0000 mL | Freq: Once | INTRAVENOUS | Status: AC | PRN
  Administered 2024-01-15: 10 mL via INTRAVENOUS

## 2024-01-21 ENCOUNTER — Ambulatory Visit: Payer: Self-pay | Admitting: *Deleted

## 2024-01-21 DIAGNOSIS — E611 Iron deficiency: Secondary | ICD-10-CM | POA: Diagnosis not present

## 2024-01-21 DIAGNOSIS — D696 Thrombocytopenia, unspecified: Secondary | ICD-10-CM | POA: Diagnosis not present

## 2024-01-23 NOTE — Telephone Encounter (Signed)
 Noted regarding CPAP DL.   Dr. Chalice can address med issues when she returns

## 2024-01-28 ENCOUNTER — Other Ambulatory Visit: Payer: Self-pay | Admitting: Neurology

## 2024-01-28 MED ORDER — PROPRANOLOL HCL 20 MG PO TABS: 20.0000 mg | ORAL_TABLET | Freq: Every day | ORAL | 6 refills | Status: DC

## 2024-01-28 NOTE — Progress Notes (Signed)
 I reduced propanolol from  60 mg ER form to  the 20 mg IR form at bedtime only. CD

## 2024-02-06 DIAGNOSIS — G894 Chronic pain syndrome: Secondary | ICD-10-CM | POA: Diagnosis not present

## 2024-02-06 DIAGNOSIS — M533 Sacrococcygeal disorders, not elsewhere classified: Secondary | ICD-10-CM | POA: Diagnosis not present

## 2024-02-06 DIAGNOSIS — M542 Cervicalgia: Secondary | ICD-10-CM | POA: Diagnosis not present

## 2024-02-06 DIAGNOSIS — M47812 Spondylosis without myelopathy or radiculopathy, cervical region: Secondary | ICD-10-CM | POA: Diagnosis not present

## 2024-02-06 DIAGNOSIS — Z5181 Encounter for therapeutic drug level monitoring: Secondary | ICD-10-CM | POA: Diagnosis not present

## 2024-02-06 DIAGNOSIS — M549 Dorsalgia, unspecified: Secondary | ICD-10-CM | POA: Diagnosis not present

## 2024-02-06 DIAGNOSIS — M5417 Radiculopathy, lumbosacral region: Secondary | ICD-10-CM | POA: Diagnosis not present

## 2024-02-06 DIAGNOSIS — M5416 Radiculopathy, lumbar region: Secondary | ICD-10-CM | POA: Diagnosis not present

## 2024-02-06 DIAGNOSIS — M5412 Radiculopathy, cervical region: Secondary | ICD-10-CM | POA: Diagnosis not present

## 2024-02-06 DIAGNOSIS — D696 Thrombocytopenia, unspecified: Secondary | ICD-10-CM | POA: Diagnosis not present

## 2024-02-15 DIAGNOSIS — F418 Other specified anxiety disorders: Secondary | ICD-10-CM | POA: Diagnosis not present

## 2024-02-15 DIAGNOSIS — F331 Major depressive disorder, recurrent, moderate: Secondary | ICD-10-CM | POA: Diagnosis not present

## 2024-03-06 DIAGNOSIS — M5416 Radiculopathy, lumbar region: Secondary | ICD-10-CM | POA: Diagnosis not present

## 2024-03-06 DIAGNOSIS — Z5181 Encounter for therapeutic drug level monitoring: Secondary | ICD-10-CM | POA: Diagnosis not present

## 2024-03-18 DIAGNOSIS — Z6833 Body mass index (BMI) 33.0-33.9, adult: Secondary | ICD-10-CM | POA: Diagnosis not present

## 2024-03-18 DIAGNOSIS — K766 Portal hypertension: Secondary | ICD-10-CM | POA: Diagnosis not present

## 2024-03-18 DIAGNOSIS — G9589 Other specified diseases of spinal cord: Secondary | ICD-10-CM | POA: Diagnosis not present

## 2024-03-18 DIAGNOSIS — G894 Chronic pain syndrome: Secondary | ICD-10-CM | POA: Diagnosis not present

## 2024-03-18 DIAGNOSIS — M48062 Spinal stenosis, lumbar region with neurogenic claudication: Secondary | ICD-10-CM | POA: Diagnosis not present

## 2024-03-18 DIAGNOSIS — Z9229 Personal history of other drug therapy: Secondary | ICD-10-CM | POA: Diagnosis not present

## 2024-03-18 DIAGNOSIS — E6609 Other obesity due to excess calories: Secondary | ICD-10-CM | POA: Diagnosis not present

## 2024-03-18 DIAGNOSIS — J439 Emphysema, unspecified: Secondary | ICD-10-CM | POA: Diagnosis not present

## 2024-03-18 DIAGNOSIS — F419 Anxiety disorder, unspecified: Secondary | ICD-10-CM | POA: Diagnosis not present

## 2024-03-18 DIAGNOSIS — I700 Atherosclerosis of aorta: Secondary | ICD-10-CM | POA: Diagnosis not present

## 2024-03-25 DIAGNOSIS — F331 Major depressive disorder, recurrent, moderate: Secondary | ICD-10-CM | POA: Diagnosis not present

## 2024-03-25 DIAGNOSIS — F418 Other specified anxiety disorders: Secondary | ICD-10-CM | POA: Diagnosis not present

## 2024-04-24 ENCOUNTER — Other Ambulatory Visit: Payer: Self-pay | Admitting: Medical Genetics

## 2024-04-25 ENCOUNTER — Other Ambulatory Visit (HOSPITAL_COMMUNITY)

## 2024-04-28 ENCOUNTER — Other Ambulatory Visit (HOSPITAL_COMMUNITY)
Admission: RE | Admit: 2024-04-28 | Discharge: 2024-04-28 | Disposition: A | Discharge status: Home / Self Care | Payer: Self-pay | Source: Ambulatory Visit | Attending: Medical Genetics | Admitting: Medical Genetics

## 2024-04-28 DIAGNOSIS — M533 Sacrococcygeal disorders, not elsewhere classified: Secondary | ICD-10-CM | POA: Diagnosis not present

## 2024-04-28 DIAGNOSIS — G894 Chronic pain syndrome: Secondary | ICD-10-CM | POA: Diagnosis not present

## 2024-04-28 DIAGNOSIS — M542 Cervicalgia: Secondary | ICD-10-CM | POA: Diagnosis not present

## 2024-04-28 DIAGNOSIS — M5412 Radiculopathy, cervical region: Secondary | ICD-10-CM | POA: Diagnosis not present

## 2024-04-28 DIAGNOSIS — M549 Dorsalgia, unspecified: Secondary | ICD-10-CM | POA: Diagnosis not present

## 2024-04-28 DIAGNOSIS — F1190 Opioid use, unspecified, uncomplicated: Secondary | ICD-10-CM | POA: Diagnosis not present

## 2024-04-28 DIAGNOSIS — Z5181 Encounter for therapeutic drug level monitoring: Secondary | ICD-10-CM | POA: Diagnosis not present

## 2024-04-28 DIAGNOSIS — D696 Thrombocytopenia, unspecified: Secondary | ICD-10-CM | POA: Diagnosis not present

## 2024-04-28 DIAGNOSIS — M5416 Radiculopathy, lumbar region: Secondary | ICD-10-CM | POA: Diagnosis not present

## 2024-04-28 DIAGNOSIS — M47812 Spondylosis without myelopathy or radiculopathy, cervical region: Secondary | ICD-10-CM | POA: Diagnosis not present

## 2024-05-02 DIAGNOSIS — M25511 Pain in right shoulder: Secondary | ICD-10-CM | POA: Diagnosis not present

## 2024-05-02 DIAGNOSIS — M19011 Primary osteoarthritis, right shoulder: Secondary | ICD-10-CM | POA: Diagnosis not present

## 2024-05-02 DIAGNOSIS — Z6832 Body mass index (BMI) 32.0-32.9, adult: Secondary | ICD-10-CM | POA: Diagnosis not present

## 2024-05-05 LAB — GENECONNECT MOLECULAR SCREEN: Genetic Analysis Overall Interpretation: NEGATIVE

## 2024-05-08 ENCOUNTER — Other Ambulatory Visit: Payer: Self-pay | Admitting: Gastroenterology

## 2024-05-09 NOTE — Telephone Encounter (Signed)
Pt needs appt before any more refills

## 2024-05-21 DIAGNOSIS — F418 Other specified anxiety disorders: Secondary | ICD-10-CM | POA: Diagnosis not present

## 2024-05-21 DIAGNOSIS — F331 Major depressive disorder, recurrent, moderate: Secondary | ICD-10-CM | POA: Diagnosis not present

## 2024-05-26 DIAGNOSIS — E782 Mixed hyperlipidemia: Secondary | ICD-10-CM | POA: Diagnosis not present

## 2024-05-26 DIAGNOSIS — E669 Obesity, unspecified: Secondary | ICD-10-CM | POA: Diagnosis not present

## 2024-05-26 DIAGNOSIS — Z6831 Body mass index (BMI) 31.0-31.9, adult: Secondary | ICD-10-CM | POA: Diagnosis not present

## 2024-05-26 DIAGNOSIS — I517 Cardiomegaly: Secondary | ICD-10-CM | POA: Diagnosis not present

## 2024-05-26 DIAGNOSIS — F419 Anxiety disorder, unspecified: Secondary | ICD-10-CM | POA: Diagnosis not present

## 2024-05-29 DIAGNOSIS — L720 Epidermal cyst: Secondary | ICD-10-CM | POA: Diagnosis not present

## 2024-05-29 DIAGNOSIS — D239 Other benign neoplasm of skin, unspecified: Secondary | ICD-10-CM | POA: Diagnosis not present

## 2024-05-29 DIAGNOSIS — L218 Other seborrheic dermatitis: Secondary | ICD-10-CM | POA: Diagnosis not present

## 2024-06-05 ENCOUNTER — Ambulatory Visit (HOSPITAL_COMMUNITY)
Admission: RE | Admit: 2024-06-05 | Discharge: 2024-06-05 | Disposition: A | Discharge status: Home / Self Care | Source: Ambulatory Visit | Attending: Acute Care | Admitting: Acute Care

## 2024-06-05 DIAGNOSIS — Z122 Encounter for screening for malignant neoplasm of respiratory organs: Secondary | ICD-10-CM | POA: Insufficient documentation

## 2024-06-05 DIAGNOSIS — F17210 Nicotine dependence, cigarettes, uncomplicated: Secondary | ICD-10-CM | POA: Diagnosis not present

## 2024-06-05 DIAGNOSIS — Z87891 Personal history of nicotine dependence: Secondary | ICD-10-CM | POA: Diagnosis not present

## 2024-06-10 ENCOUNTER — Telehealth (INDEPENDENT_AMBULATORY_CARE_PROVIDER_SITE_OTHER): Payer: Self-pay

## 2024-06-10 NOTE — Telephone Encounter (Signed)
 Called patient and left voicemail regarding a voicemail that was left for us  by a law firm that said they sent a records request to us . I called patient to check to make sure that we can speak with these people and that he knows about this . Awaiting call back.

## 2024-06-12 ENCOUNTER — Other Ambulatory Visit: Payer: Self-pay

## 2024-06-12 DIAGNOSIS — Z122 Encounter for screening for malignant neoplasm of respiratory organs: Secondary | ICD-10-CM

## 2024-06-12 DIAGNOSIS — Z87891 Personal history of nicotine dependence: Secondary | ICD-10-CM

## 2024-06-12 NOTE — Telephone Encounter (Signed)
 Patient returned the call and left a voicemail message regarding Medical Records.  I called and spoke with the patient.  He was not sure if we received a request for medical records.  [facility] did not receive a request for medical records.  There was a request sent to another practice from [facility].  I explained that to the patient and he expressed understanding.

## 2024-06-13 NOTE — Progress Notes (Signed)
 Chief complaint: Chronic pain History of Present Illness: Jerome [name] is a 63 y.o. male who presents for follow-up evaluation of chronic pain and re-assessment of the efficacy of treatment. hehas undergone treatment for hispain, does continue to have some pain in the neck with some proximal radiation.  Has done oral medications including prescription medications which do take off the edge of the pain.   Current Treatment Modalities: Outpatient Medications Prior to Visit  Medication Sig Dispense Refill  . acetaminophen (TYLENOL) 500 MG tablet Take 1,000 mg by mouth every 6 (six) hours as needed for Pain.    . bisacodyL (DULCOLAX) 5 mg EC tablet Take 2 tablets (10 mg total) by mouth once daily    . calcium  citrate-vitamin D3 (CITRACAL+D) 315-200 mg-unit tablet 1 tablet once daily. Reported on 07/28/2015     . cetirizine (ZYRTEC) 10 mg capsule Take 10 mg by mouth as needed.     . clonazePAM (KLONOPIN) 1 MG tablet Take 1 tablet (1 mg total) by mouth 2 (two) times daily 60 tablet 0  . Compound Medication 1 Pouch once daily Med Name: ON! (For smokers)    . cyclobenzaprine  (FLEXERIL ) 10 MG tablet Take 0.5 tablets (5 mg total) by mouth 2 (two) times daily as needed for Muscle spasms 90 tablet 0  . docusate (COLACE) 100 MG capsule Take 3 capsules (300 mg total) by mouth at bedtime    . flaxseed oil Oil Take 1 cap by mouth daily    . fluticasone (FLONASE) 50 mcg/actuation nasal spray Place 1 spray into both nostrils 2 (two) times daily.    10  . fsh/flx/prim/cur/bor/om3,6,9 5 (OMEGA 3-6-9 FATTY ACIDS ORAL) Take by mouth once daily       . HYDROcodone-acetaminophen (NORCO) 5-325 mg tablet Take 1 tablet by mouth every 6 (six) hours as needed for Pain 20 tablet 0  . hydrocortisone  2.5 % cream     . losartan  (COZAAR ) 25 MG tablet Take 25 mg by mouth once daily    . lubiprostone  (AMITIZA ) 24 MCG capsule Take 24 mcg by mouth 2 (two) times daily    . multivitamin tablet Take 1 tablet by mouth every morning.      SABRA omeprazole (PRILOSEC) 20 MG DR capsule Take 20 mg by mouth every other day    . pravastatin (PRAVACHOL) 40 MG tablet Take 40 mg by mouth every morning.      . propranoloL  (INDERAL ) 20 MG tablet Take 20 mg by mouth at bedtime    . sertraline (ZOLOFT) 100 MG tablet Take 100 mg by mouth every morning     No facility-administered medications prior to visit.   Pain Medications       Disp Refills Start End   acetaminophen (TYLENOL) 500 MG tablet -- --  --   Sig - Route: Take 1,000 mg by mouth every 6 (six) hours as needed for Pain. - Oral   Class: Historical Med   clonazePAM (KLONOPIN) 1 MG tablet 60 tablet 0 04/28/2024 --   Sig - Route: Take 1 tablet (1 mg total) by mouth 2 (two) times daily - Oral   Class: Electronic   cyclobenzaprine  (FLEXERIL ) 10 MG tablet 90 tablet 0 04/28/2024 --   Sig - Route: Take 0.5 tablets (5 mg total) by mouth 2 (two) times daily as needed for Muscle spasms - Oral   Class: Electronic   HYDROcodone-acetaminophen (NORCO) 5-325 mg tablet 20 tablet 0 04/28/2024 --   Sig - Route: Take 1 tablet by mouth every  6 (six) hours as needed for Pain - Oral   Class: Electronic       Pain Questionnaire Flowsheets: Analgesia:      Activities of Daily Living:      Adverse Effects: No side effects reported  Aberrant Behaviors:    10/08/2017   10:11 AM  Aberrant Behavior  Have you used marijuana in the past month? No  Have you ever used marijuana? Yes  Have you used cocaine in the past month? No  Have you used any illicit drugs in the past month? No  Have you ever used any illicit drugs? No  Have you ever been treated for substance abuse? No  Are you currently receiving treatment for substance abuse? No  Are you currently Divorced  Are you involved in a lawsuit or compensation claim? No  Have you ever been discharged from another physician's practice? No   REMS: Opioid agreement, laboratory screening, and PDMD were reviewed during this visit.     Allergies  Allergen Reactions  . Aspirin Other (See Comments) and Nausea And Vomiting    Due to gastric sleeve surgery  Due to gastric sleeve surgery   . Codeine Nausea and Nausea And Vomiting  . Nsaids (Non-Steroidal Anti-Inflammatory Drug) Other (See Comments)    Due to gastric sleeve surgery    Current Outpatient Medications  Medication Sig Dispense Refill  . acetaminophen (TYLENOL) 500 MG tablet Take 1,000 mg by mouth every 6 (six) hours as needed for Pain.    . bisacodyL (DULCOLAX) 5 mg EC tablet Take 2 tablets (10 mg total) by mouth once daily    . calcium  citrate-vitamin D3 (CITRACAL+D) 315-200 mg-unit tablet 1 tablet once daily. Reported on 07/28/2015     . cetirizine (ZYRTEC) 10 mg capsule Take 10 mg by mouth as needed.     . clonazePAM (KLONOPIN) 1 MG tablet Take 1 tablet (1 mg total) by mouth 2 (two) times daily 60 tablet 0  . Compound Medication 1 Pouch once daily Med Name: ON! (For smokers)    . cyclobenzaprine  (FLEXERIL ) 10 MG tablet Take 0.5 tablets (5 mg total) by mouth 2 (two) times daily as needed for Muscle spasms 90 tablet 0  . docusate (COLACE) 100 MG capsule Take 3 capsules (300 mg total) by mouth at bedtime    . flaxseed oil Oil Take 1 cap by mouth daily    . fluticasone (FLONASE) 50 mcg/actuation nasal spray Place 1 spray into both nostrils 2 (two) times daily.    10  . fsh/flx/prim/cur/bor/om3,6,9 5 (OMEGA 3-6-9 FATTY ACIDS ORAL) Take by mouth once daily       . HYDROcodone-acetaminophen (NORCO) 5-325 mg tablet Take 1 tablet by mouth every 6 (six) hours as needed for Pain 20 tablet 0  . hydrocortisone  2.5 % cream     . losartan  (COZAAR ) 25 MG tablet Take 25 mg by mouth once daily    . lubiprostone  (AMITIZA ) 24 MCG capsule Take 24 mcg by mouth 2 (two) times daily    . multivitamin tablet Take 1 tablet by mouth every morning.     SABRA omeprazole (PRILOSEC) 20 MG DR capsule Take 20 mg by mouth every other day    . pravastatin (PRAVACHOL) 40 MG tablet Take 40 mg by  mouth every morning.      . propranoloL  (INDERAL ) 20 MG tablet Take 20 mg by mouth at bedtime    . sertraline (ZOLOFT) 100 MG tablet Take 100 mg by mouth every morning  No current facility-administered medications for this visit.   Past Medical History:  Diagnosis Date  . Anxiety and depression   . Arthritis    degenerative disc disease and spinal stenosis  . Chronic pain   . Clotting disorder (CMS/HHS-HCC)    thrombocytopenia  . Depression   . GERD (gastroesophageal reflux disease)   . Gout, joint   . History of EMG/NCV [facility] 10/11/2016 10/11/2016  . History of EMG/NCV [facility] 11/04/2014 10/11/2016  . Hyperlipidemia   . Hypertension   . Idiopathic thrombocytopenia (CMS/HHS-HCC)   . Neuropathy   . Obesity   . OSA (obstructive sleep apnea)    with BiPap  . Osteoarthritis    Past Surgical History:  Procedure Laterality Date  . vertical sleeve gastrectomy  07/22/2012  . POSTERIOR LUMBAR SPINE FUSION ONE LEVEL Bilateral 01/29/2013   Procedure: POSTERIOR LUMBAR SPINE FUSION ONE LEVEL;  Surgeon: Cena Rankin Gaines, MD;  Location: [facility];  Service: Neurosurgery;  Laterality: Bilateral;  L3-5 laminectomy and fusion/stabilization  . INSTRUMENTATION POSTERIOR SPINE 3 TO 6 VERTEBRAL SEGMENTS Bilateral 01/29/2013   Procedure: INSTRUMENTATION POSTERIOR SPINE 3 TO 6 VERTEBRAL SEGMENTS;  Surgeon: Cena Rankin Gaines, MD;  Location: [facility];  Service: Neurosurgery;  Laterality: Bilateral;  L3-5 laminectomy and fusion/stabilization  . INSERTION MORSELIZED BONE ALLOGRAFT FOR SPINE SURGERY Bilateral 01/29/2013   Procedure: INSERTION MORSELIZED BONE ALLOGRAFT FOR SPINE SURGERY;  Surgeon: Cena Rankin Gaines, MD;  Location: [facility];  Service: Neurosurgery;  Laterality: Bilateral;  L3-5 laminectomy and fusion/stabilization  . AUTOGRAFT OBTAINED SAME INCISION FOR SPINE SURGERY Bilateral 01/29/2013   Procedure: AUTOGRAFT OBTAINED  SAME INCISION FOR SPINE SURGERY;  Surgeon: Cena Rankin Gaines, MD;  Location: [facility];  Service: Neurosurgery;  Laterality: Bilateral;  L3-5 laminectomy and fusion/stabilization  . LAMINECTOMY POSTERIOR LUMBAR FACETECTOMY & FORAMINOTOMY W/DECOMP Bilateral 01/29/2013   Procedure: LAMINECTOMY POSTERIOR LUMBAR FACETECTOMY & FORAMINOTOMY W/DECOMP;  Surgeon: Cena Rankin Gaines, MD;  Location: [facility];  Service: Neurosurgery;  Laterality: Bilateral;  L3-5 laminectomy and fusion/stabilization  . LAMINECTOMY POSTERIOR CERVICLE DECOMP W/FACETECTOMY & FORAMINOTOMY Bilateral 01/29/2013   Procedure: LAMINEC/FACETECT/FORAMIN,EACH ADDNL;  Surgeon: Cena Rankin Gaines, MD;  Location: [facility];  Service: Neurosurgery;  Laterality: Bilateral;  L3-5 laminectomy and fusion/stabilization  . EXCISION EXCESSIVE SKIN & TISSUE ABDOMEN N/A 08/03/2015   Procedure: EXCISION, EXCESSIVE SKIN AND SUBCUTANEOUS TISSUE (INCLUDES LIPECTOMY); ABDOMEN, INFRAUMBILICAL PANNICULECTOMY;  Surgeon: Otis Pilling, MD;  Location: [facility];  Service: Plastic Surgery;  Laterality: N/A;  . EXCISION EXCESSIVE SKIN & TISSUE ABDOMEN Bilateral 08/03/2015   Procedure: EXCISION, EXCESSIVE SKIN AND SUBQ TISSUE (INCLUDES LIPECTOMY), ABDOMEN (EG, ABDOMINOPLASTY) (INCLUDES UMBILICAL TRANSPOSITION AND FASCIAL PLICATION) (LIST IN ADDITION TO PRIMARY PROCEDURE);  Surgeon: Otis Pilling, MD;  Location: [facility] OR;  Service: Plastic Surgery;  Laterality: Bilateral;  . EXCISION EXCESSIVE SKIN & TISSUE ABDOMEN N/A 08/03/2015   Procedure: EXCISION, EXCESSIVE SKIN AND SUBCUTANEOUS TISSUE (INCLUDES LIPECTOMY); GENITALIA (pubis);  Surgeon: Otis Pilling, MD;  Location: [facility] OR;  Service: Plastic Surgery;  Laterality: N/A;  . DEBRIDEMENT SKIN/SUBCUTANEOUS TISSUE ABDOMINAL WALL N/A 08/08/2015   Procedure: DEBRIDEMENT, ABDOMEN; SUBCUTANEOUS TISSUE (INCLUDES EPIDERMIS AND DERMIS, IF PERFORMED); FIRST 20 SQ CM OR LESS;   Surgeon: Glendia Debby Hastings, MD;  Location: [facility];  Service: Plastic Surgery;  Laterality: N/A;  . LAMINECTOMY CERVICAL FOR DREZ    . lumbar disectomy    . MASTECTOMY BILATERAL FOR GYNECOMASTIA    . neck surgery  x3 anterior cervical fusions  . SPINE SURGERY     Family History  Problem Relation Age of Onset  . Lung cancer Mother   . Cancer Mother   . Migraines Mother   . Neuropathy Mother        Mother  . Anemia Mother   . COPD Mother   . Depression Mother   . Osteoarthritis Mother   . Sleep apnea Mother   . Coronary Artery Disease (Blocked arteries around heart) Father   . Brain hemorrhage Father        Father  . Myocardial Infarction (Heart attack) Father   . Hyperlipidemia (Elevated cholesterol) Father   . High blood pressure (Hypertension) Father   . Stroke Father   . Ulcers Father   . Heart disease Father   . Brain cancer Brother        Brother  . Cancer Sister   . Hyperlipidemia (Elevated cholesterol) Sister   . High blood pressure (Hypertension) Sister   . Seizures Sister   . Anemia Sister   . Cancer Brother        Primary Brain x2  . High blood pressure (Hypertension) Brother   . Cancer Paternal Grandfather        Primary Brain  . Hyperlipidemia (Elevated cholesterol) Sister   . High blood pressure (Hypertension) Sister   . Cancer Sister   . Anesthesia problems Neg Hx   . Malignant hypertension Neg Hx    Social History   Socioeconomic History  . Marital status: Divorced  Tobacco Use  . Smoking status: Some Days    Current packs/day: 0.25    Average packs/day: 0.3 packs/day for 6.1 years (1.5 ttl pk-yrs)    Types: Cigarettes    Start date: 04/23/2018    Passive exposure: Never  . Smokeless tobacco: Never  . Tobacco comments:    I have a few cigarettes with coffee. Must quit those AGAIN  Vaping Use  . Vaping status: Never Used  Substance and Sexual Activity  . Alcohol use: Not Currently    Alcohol/week: 4.0 standard drinks of  alcohol    Types: 3 Cans of beer, 1 Shots of liquor per week    Comment: Equivalent of 1-3 beers per month  . Drug use: No  . Sexual activity: Not Currently    Partners: Male    Birth control/protection: Other-see comments    Comment: Abstain no desire no sensation   Social Drivers of Corporate Investment Banker Strain: Low Risk  (01/03/2024)   Overall Financial Resource Strain (CARDIA)   . Difficulty of Paying Living Expenses: Not very hard  Recent Concern: Financial Resource Strain - Medium Risk (10/25/2023)   Overall Financial Resource Strain (CARDIA)   . Difficulty of Paying Living Expenses: Somewhat hard  Food Insecurity: Food Insecurity Present (01/03/2024)   Hunger Vital Sign   . Worried About Programme Researcher, Broadcasting/film/video in the Last Year: Sometimes true   . Ran Out of Food in the Last Year: Sometimes true  Transportation Needs: No Transportation Needs (01/03/2024)   PRAPARE - Transportation   . Lack of Transportation (Medical): No   . Lack of Transportation (Non-Medical): No  Housing Stability: High Risk (01/03/2024)   Housing Stability Vital Sign   . Unable to Pay for Housing in the Last Year: Yes   . Number of Times Moved in the Last Year: 0   . Homeless in the Last Year: No   Review of Systems: ROS Per HPI  Physical Examination: Vitals:   06/12/24 1424 06/12/24 1444  BP: 128/88 (!) 144/91  Pulse: 77 71  SpO2: 94%   PainSc:   8   PainLoc: Head - Entire    There is no height or weight on file to calculate BMI.  Physical Exam Constitutional:      Appearance: Normal appearance. He is not diaphoretic.  HENT:     Head: Normocephalic and atraumatic.  Eyes:     Conjunctiva/sclera: Conjunctivae normal.     Pupils: Pupils are equal, round, and reactive to light.  Pulmonary:     Effort: Pulmonary effort is normal. No respiratory distress.  Musculoskeletal:     Comments: Decreased cervical range of motion, tenderness palpation neck and proximal upper limb  Skin:    Findings:  No rash.  Neurological:     Mental Status: He is alert.     Motor: Motor function is intact. No abnormal muscle tone.     Coordination: Coordination normal.  Psychiatric:        Mood and Affect: Mood and affect normal.        Cognition and Memory: Memory normal.        Judgment: Judgment normal.     Comments: There are no overt pain behaviors noted during the exam. There is no evidence of acute intoxication, oversedation, or psychomotor agitation.  Eye contact is appropriate.    Risk Evaluation and Mitigation Strategies (REMS): Medication Comments documented by Claudene Duwaine DASEN, CMA on 08/20/2018 at 1330. Patient has a signed Controlled Substance Agreement with [facility] on 08/20/2018.    Opioid Agreement Reviewed 06/13/2024   Laboratory Drug Screening 06/13/2024   [place] PDM Registry Reviewed 06/13/2024   [place] Offender Reviewed  Performed only if needed  Medical Psych Evaluation  Performed only if needed  Naloxone Counseling  Performed only if needed  Last EKG (QTC interval)  Performed only if needed  OSA Screening  Performed only if needed   Previous Imaging: Reviewed Imaging chart tab since last encounter.  POC Testing: Performed and results pending Laboratory drug screen is medically necessary for monitoring controlled substances, ensuring adherence to our treatment agreement, and maintaining compliance with the treatment plan.  Impression: Patient Active Problem List  Diagnosis  . Cervical stenosis of spine  . Obesity  . Major depressive disorder, recurrent episode, moderate (CMS-HCC)  . Anxiety state, unspecified  . Osteoarthritis  . Bariatric surgery status  . HTN (hypertension)  . GERD (gastroesophageal reflux disease)  . OSA (obstructive sleep apnea)  . Hyperlipidemia  . Thrombocytopenia ()  . Chronic cervical radiculopathy  . Chronic lumbar radiculopathy  . Lipodystrophy  . Loss of weight  . Acquired buried penis  . Cubital tunnel  syndrome, left  . History of EMG/NCV [facility] 10/11/2016  . History of EMG/NCV [facility] 11/04/2014  . SI (sacroiliac) joint dysfunction  . Chronic bilateral low back pain without sciatica  . Cervicalgia  . Chronic pain syndrome  . Cervical myelopathy (CMS/HHS-HCC)  . Taste impairment  . Iron deficiency  . Intestinal malabsorption (HHS-HCC)   No diagnosis found. Plan: Prescriptions and orders given at today's visit:  Requested Prescriptions    No prescriptions requested or ordered in this encounter   No orders of the defined types were placed in this encounter.  Mr. Ilic has been invited to be an active participant in the formation of his comprehensive care plan. Pain does seem to be multifactorial to  include both underlying axial/spinal, and superficial myofascial pain.  This will require continued multifaceted care.  Medications: We will continue prescription oral pain medication which does take the edge of the pain allows him to be more functional  Injections: RFA cervical  Therapy: Continue gentle stretching at home, could consider formal PT, or chiropractic care if needed  [facility] I personally performed the service.  (TP)  GABRIEL LEHMAN SHARPS, MD  Medical Decision Making: Level of MDM: Today's Level of Service is based on Medical Decision Making Problems Addressed: moderate - two or more stable chronic illnesses Data Complexity: moderate Management Risk: moderate - prescription drug management

## 2024-06-24 ENCOUNTER — Encounter (INDEPENDENT_AMBULATORY_CARE_PROVIDER_SITE_OTHER): Payer: Self-pay | Admitting: *Deleted

## 2024-06-26 ENCOUNTER — Telehealth (INDEPENDENT_AMBULATORY_CARE_PROVIDER_SITE_OTHER): Payer: Self-pay | Admitting: Otolaryngology

## 2024-06-26 NOTE — Telephone Encounter (Signed)
 The patient called in and left a voicemail at 11:50 am, he stated he is an exisiting patient of Dr Rojean and needed an appointment for his ears and hearing.  Jerome [name] confirmed he is a current patient of Dr Rojean and I left a voicemail for the patient that we can get him scheduled for an appointment to follow up with Dr Karis about his ears and hearing.

## 2024-06-30 ENCOUNTER — Encounter: Payer: Self-pay | Admitting: Neurology

## 2024-06-30 ENCOUNTER — Ambulatory Visit: Admitting: Neurology

## 2024-06-30 VITALS — BP 136/90 | HR 79 | Ht 71.0 in | Wt 202.0 lb

## 2024-06-30 DIAGNOSIS — G43711 Chronic migraine without aura, intractable, with status migrainosus: Secondary | ICD-10-CM | POA: Diagnosis not present

## 2024-06-30 DIAGNOSIS — Z9289 Personal history of other medical treatment: Secondary | ICD-10-CM | POA: Diagnosis not present

## 2024-06-30 DIAGNOSIS — Z720 Tobacco use: Secondary | ICD-10-CM

## 2024-06-30 DIAGNOSIS — F418 Other specified anxiety disorders: Secondary | ICD-10-CM | POA: Diagnosis not present

## 2024-06-30 DIAGNOSIS — F331 Major depressive disorder, recurrent, moderate: Secondary | ICD-10-CM | POA: Diagnosis not present
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
## 2024-06-30 NOTE — Patient Instructions (Addendum)
 RV I 12 months.    Tinnitus  secondarily traumatic ear injury:  Tinnitus is when you hear a sound that there's no actual source for. It may sound like ringing in your ears or something else. The sound may be loud, soft, or somewhere in between. It can last for a few seconds or be constant for days. It can come and go. Almost everyone has tinnitus at some point. It's not the same as hearing loss. But you may need to see a health care provider if: It lasts for a long time. It comes back often. You have trouble sleeping and focusing. What are the causes? The cause of tinnitus is often unknown. In some cases, you may get it if: You're around loud noises, such as from machines or music. An object gets stuck in your ear. Earwax builds up in landscape architect. You drink a lot of alcohol or caffeine. You take certain medicines. You start to lose your hearing. You may also get it from some medical conditions. These may include: Ear or sinus infections. Heart diseases. High blood pressure. Allergies. Mnire's disease. Problems with your thyroid . A tumor. This is a growth of cells that isn't normal. A weak, bulging blood vessel called an aneurysm near your ear. What increases the risk? You may be more likely to get tinnitus if: You're around loud noises a lot. You're older. You drink alcohol. You smoke. What are the signs or symptoms? The main symptom is hearing a sound that there's no source for. It may sound like ringing. It may also sound like: Buzzing. Sizzling. Blowing air. Hissing. Whistling. Other sounds may include: Roaring. Running water . A musical note. Tapping. Humming. You may have symptoms in one ear or both ears. How is this diagnosed? Tinnitus is diagnosed based on your symptoms, your medical history, and an exam. Your provider may do a full hearing test if your tinnitus: Is in just one ear. Makes it hard for you to hear. Lasts 6 months or longer. You may also need to  see an expert in hearing disorders called an audiologist. They may ask you about your symptoms and how tinnitus affects your daily life. You may have other tests done. These may include: A CT scan. An MRI. An angiogram. This shows how blood flows through your blood vessels. How is this treated? Treatment may include: Therapy to help you manage the stress of living with tinnitus. Finding ways to mask or cover the sound of tinnitus. These include: Sound or white noise machines. Devices that fit in your ear and play sounds or music. A loud humidifier. Acoustic neural stimulation. This is when you use headphones to listen to music that has a special signal in it. Over time, this signal may change some of the pathways in your brain. This can make you less sensitive to tinnitus. This treatment is used for very severe cases. Using hearing aids or cochlear implants if your tinnitus is from hearing loss. If your tinnitus is caused by a medical condition, treating the condition may make it go away.  Follow these instructions at home: Managing symptoms     Try to avoid being in loud places or around loud noises. Wear earplugs or headphones when you're around loud noises. Find ways to reduce stress. These may include meditation, yoga, or deep breathing. Sleep with your head slightly raised. General instructions Take over-the-counter and prescription medicines only as told by your provider. Track the things that cause symptoms (triggers). Try to avoid these  things. To stop your tinnitus from getting worse: Do not drink alcohol. Do not have caffeine. Do not use any products that contain nicotine or tobacco. These products include cigarettes, chewing tobacco, and vaping devices, such as e-cigarettes. If you need help quitting, ask your provider. Avoid using too much salt. Get enough sleep each night. Where to find more information American Tinnitus Association: https://www.johnson-hamilton.org/ Contact a health care  provider if: Your symptoms last for 3 weeks or longer without stopping. You have sudden hearing loss. Your symptoms get worse or don't get better with home care. You can't manage the stress of living with tinnitus. Get help right away if: You get tinnitus after a head injury. You have tinnitus and: Dizziness. Nausea and vomiting. Loss of balance. A sudden, severe headache. Changes to your eyesight. Weakness in your face, arms, or legs. These symptoms may be an emergency. Get help right away. Call 911. Do not wait to see if the symptoms will go away. Do not drive yourself to the hospital. This information is not intended to replace advice given to you by your health care provider. Make sure you discuss any questions you have with your health care provider. Document Revised: 10/16/2022 Document Reviewed: 10/16/2022 Elsevier Patient Education  2024 Elsevier Inc.Complex- central sleep apnea, opioid induced.  BiPAP user.   RV in 12 months.

## 2024-07-02 DIAGNOSIS — Z0000 Encounter for general adult medical examination without abnormal findings: Secondary | ICD-10-CM | POA: Diagnosis not present

## 2024-07-02 DIAGNOSIS — H6123 Impacted cerumen, bilateral: Secondary | ICD-10-CM | POA: Diagnosis not present

## 2024-07-02 DIAGNOSIS — Z1331 Encounter for screening for depression: Secondary | ICD-10-CM | POA: Diagnosis not present

## 2024-07-02 DIAGNOSIS — M21372 Foot drop, left foot: Secondary | ICD-10-CM | POA: Diagnosis not present

## 2024-07-02 DIAGNOSIS — J309 Allergic rhinitis, unspecified: Secondary | ICD-10-CM | POA: Diagnosis not present

## 2024-07-18 ENCOUNTER — Other Ambulatory Visit: Payer: Self-pay | Admitting: Gastroenterology

## 2024-07-23 ENCOUNTER — Other Ambulatory Visit: Payer: Self-pay | Admitting: Neurology

## 2024-07-29 ENCOUNTER — Ambulatory Visit (INDEPENDENT_AMBULATORY_CARE_PROVIDER_SITE_OTHER): Admitting: Otolaryngology

## 2024-08-19 MED ORDER — LUBIPROSTONE 24 MCG PO CAPS: 24.0000 ug | ORAL_CAPSULE | Freq: Every day | ORAL | 2 refills | Status: AC

## 2024-08-19 NOTE — Addendum Note (Signed)
 Addended by: SHIRLEAN THERISA ORN on: 08/19/2024 02:35 PM   Modules accepted: Orders

## 2024-09-15 ENCOUNTER — Ambulatory Visit (INDEPENDENT_AMBULATORY_CARE_PROVIDER_SITE_OTHER): Admitting: Audiology

## 2024-09-16 ENCOUNTER — Ambulatory Visit: Admitting: Gastroenterology

## 2025-07-06 ENCOUNTER — Ambulatory Visit: Admitting: Adult Health
